# Patient Record
Sex: Female | Born: 1960 | Race: White | Hispanic: No | Marital: Married | Smoking: Former smoker
Health system: Southern US, Community
[De-identification: ages and names within clinical notes are randomized; demographics above are authoritative.]

## PROBLEM LIST (undated history)

## (undated) DIAGNOSIS — E119 Type 2 diabetes mellitus without complications: Secondary | ICD-10-CM

## (undated) DIAGNOSIS — F419 Anxiety disorder, unspecified: Secondary | ICD-10-CM

## (undated) DIAGNOSIS — I2510 Atherosclerotic heart disease of native coronary artery without angina pectoris: Secondary | ICD-10-CM

## (undated) DIAGNOSIS — I5032 Chronic diastolic (congestive) heart failure: Secondary | ICD-10-CM

## (undated) DIAGNOSIS — Z720 Tobacco use: Secondary | ICD-10-CM

## (undated) DIAGNOSIS — E785 Hyperlipidemia, unspecified: Secondary | ICD-10-CM

## (undated) DIAGNOSIS — I5189 Other ill-defined heart diseases: Secondary | ICD-10-CM

## (undated) DIAGNOSIS — F3341 Major depressive disorder, recurrent, in partial remission: Secondary | ICD-10-CM

## (undated) DIAGNOSIS — I10 Essential (primary) hypertension: Secondary | ICD-10-CM

## (undated) DIAGNOSIS — J45909 Unspecified asthma, uncomplicated: Secondary | ICD-10-CM

## (undated) HISTORY — DX: Essential (primary) hypertension: I10

## (undated) HISTORY — PX: CARDIAC CATHETERIZATION: SHX172

## (undated) HISTORY — DX: Other ill-defined heart diseases: I51.89

## (undated) HISTORY — DX: Type 2 diabetes mellitus without complications: E11.9

## (undated) HISTORY — DX: Major depressive disorder, recurrent, in partial remission: F33.41

## (undated) HISTORY — DX: Hyperlipidemia, unspecified: E78.5

## (undated) HISTORY — DX: Anxiety disorder, unspecified: F41.9

## (undated) HISTORY — DX: Atherosclerotic heart disease of native coronary artery without angina pectoris: I25.10

## (undated) HISTORY — DX: Chronic diastolic (congestive) heart failure: I50.32

## (undated) HISTORY — DX: Tobacco use: Z72.0

---

## 2007-06-23 ENCOUNTER — Emergency Department: Payer: Self-pay | Admitting: Emergency Medicine

## 2013-02-01 ENCOUNTER — Ambulatory Visit: Payer: Self-pay

## 2016-09-06 ENCOUNTER — Encounter: Payer: Self-pay | Admitting: Emergency Medicine

## 2016-09-06 ENCOUNTER — Emergency Department
Admission: EM | Admit: 2016-09-06 | Discharge: 2016-09-07 | Disposition: A | Discharge status: Home / Self Care | Payer: BLUE CROSS/BLUE SHIELD | Attending: Emergency Medicine | Admitting: Emergency Medicine

## 2016-09-06 DIAGNOSIS — Y999 Unspecified external cause status: Secondary | ICD-10-CM | POA: Insufficient documentation

## 2016-09-06 DIAGNOSIS — Y9389 Activity, other specified: Secondary | ICD-10-CM | POA: Diagnosis not present

## 2016-09-06 DIAGNOSIS — X58XXXA Exposure to other specified factors, initial encounter: Secondary | ICD-10-CM | POA: Insufficient documentation

## 2016-09-06 DIAGNOSIS — S4992XA Unspecified injury of left shoulder and upper arm, initial encounter: Secondary | ICD-10-CM | POA: Diagnosis present

## 2016-09-06 DIAGNOSIS — I10 Essential (primary) hypertension: Secondary | ICD-10-CM | POA: Diagnosis not present

## 2016-09-06 DIAGNOSIS — G20 Parkinson's disease: Secondary | ICD-10-CM | POA: Diagnosis not present

## 2016-09-06 DIAGNOSIS — M25512 Pain in left shoulder: Secondary | ICD-10-CM | POA: Diagnosis not present

## 2016-09-06 DIAGNOSIS — Y92007 Garden or yard of unspecified non-institutional (private) residence as the place of occurrence of the external cause: Secondary | ICD-10-CM | POA: Insufficient documentation

## 2016-09-06 MED ORDER — MELOXICAM 7.5 MG PO TABS: 7.5000 mg | ORAL_TABLET | Freq: Every day | ORAL | 1 refills | Status: AC

## 2016-09-06 MED ORDER — CYCLOBENZAPRINE HCL 10 MG PO TABS
5.0000 mg | ORAL_TABLET | Freq: Once | ORAL | Status: AC
  Administered 2016-09-06: 5 mg via ORAL
  Filled 2016-09-06: qty 1

## 2016-09-06 MED ORDER — CYCLOBENZAPRINE HCL 5 MG PO TABS: 5.0000 mg | ORAL_TABLET | Freq: Three times a day (TID) | ORAL | 0 refills | Status: AC | PRN

## 2016-09-06 NOTE — ED Provider Notes (Signed)
[facility] Emergency Department Provider Note  ____________________________________________  Time seen: Approximately 11:24 PM  I have reviewed the triage vital signs and the nursing notes.   HISTORY  Chief Complaint Shoulder Pain    HPI Sherry Torres is a 56 y.o. female presenting to the emergency department with 6/10 left shoulder pain. Patient states that non-radiating pain occurs at the lateral deltoid. Patient states that she noticed pain after working in the yard yesterday. She denies radiculopathy or weakness. No prior traumas or surgeries to the left upper extremity.  Patient states that she feels very anxious. Patient's husband was recently diagnosed with Parkinson's disease and patient is the only one in the household working. She denies chest pain, chest tightness, shortness of breath, nausea, vomiting and abdominal pain. She has not been diaphoretic. Patient states that she has essential hypertension and takes her medication in the morning. She denies a history of heavy lifting but has a very physically demanding job. Patient has tried Tylenol but has attempted no other alleviating measures.    No past medical history on file.  There are no active problems to display for this patient.   No past surgical history on file.  Prior to Admission medications   Medication Sig Start Date End Date Taking? Authorizing Provider  cyclobenzaprine (FLEXERIL) 5 MG tablet Take 1 tablet (5 mg total) by mouth 3 (three) times daily as needed for muscle spasms. 09/06/16 09/09/16  Orvil Feil, PA-C  meloxicam (MOBIC) 7.5 MG tablet Take 1 tablet (7.5 mg total) by mouth daily. 09/06/16 09/13/16  Orvil Feil, PA-C    Allergies Codeine  No family history on file.  Social History Social History  Substance Use Topics  . Smoking status: Not on file  . Smokeless tobacco: Not on file  . Alcohol use Not on file     Review of Systems  Constitutional: No  fever/chills Eyes: No visual changes. No discharge ENT: No upper respiratory complaints. Cardiovascular: no chest pain. Respiratory: no cough. No SOB. Gastrointestinal: No abdominal pain.  No nausea, no vomiting.  No diarrhea.  No constipation. Musculoskeletal: Patient has left shoulder pain.  Skin: Negative for rash, abrasions, lacerations, ecchymosis. Neurological: Negative for headaches, focal weakness or numbness. ___________________________________________   PHYSICAL EXAM:  VITAL SIGNS: ED Triage Vitals  Enc Vitals Group     BP 09/06/16 2210 (!) 206/83     Pulse Rate 09/06/16 2210 83     Resp 09/06/16 2210 20     Temp --      Temp src --      SpO2 09/06/16 2210 97 %     Weight 09/06/16 2208 200 lb (90.7 kg)     Height 09/06/16 2208  (1.575 m)     Head Circumference --      Peak Flow --      Pain Score 09/06/16 2208 6     Pain Loc --      Pain Edu? --      Excl. in GC? --      Constitutional: Alert and oriented. Well appearing and in no acute distress. Eyes: Conjunctivae are normal. PERRL. EOMI. Head: Atraumatic. Neck: No cervical spine tenderness to palpation. Patient has tenderness to palpation along the upper trapezius bilaterally. No radiculopathy was elicited with range of motion at the neck. Cardiovascular: Normal rate, regular rhythm. Normal S1 and S2.  Good peripheral circulation. Respiratory: Normal respiratory effort without tachypnea or retractions. Lungs CTAB. Good air entry to  the bases with no decreased or absent breath sounds. Gastrointestinal: Bowel sounds 4 quadrants. Soft and nontender to palpation. No guarding or rigidity. No palpable masses. No distention. No CVA tenderness. Musculoskeletal: Patient has 5 out of 5 strength in the upper extremities bilaterally. Left shoulder: Patient has mild tenderness to palpation along the left lateral deltoid. No weakness or pain was elicited with left rotator cuff testing. Palpable radial and ulnar pulses  bilaterally and symmetrically.  Neurologic:  Normal speech and language. No gross focal neurologic deficits are appreciated. Reflexes are 2+ and symmetric bilaterally.  Skin:  Skin is warm, dry and intact. No rash noted. Psychiatric: Mood and affect are normal. Speech and behavior are normal. Patient exhibits appropriate insight and judgement.   ____________________________________________   LABS (all labs ordered are listed, but only abnormal results are displayed)  Labs Reviewed - No data to display ____________________________________________  EKG  Normal sinus rhythm. No ST segment elevation. ____________________________________________  RADIOLOGY   No results found.  ____________________________________________    PROCEDURES  Procedure(s) performed:    Procedures    Medications  cyclobenzaprine (FLEXERIL) tablet 5 mg (not administered)     ____________________________________________   INITIAL IMPRESSION / ASSESSMENT AND PLAN / ED COURSE  Pertinent labs & imaging results that were available during my care of the patient were reviewed by me and considered in my medical decision making (see chart for details).  Review of the [place] CSRS was performed in accordance of the [facility] prior to dispensing any controlled drugs.    Assessment and Plan: Left Shoulder Pain  Patient presents to the emergency department with left shoulder pain along the distribution of the lateral deltoid. No pain or weakness was elicited with left rotator cuff testing. As patient is anxious with hypertension noted at triage, an EKG was conducted. EKG reveals normal sinus rhythm without ST segment elevation. Patient was discharged with Mobic and Flexeril. A referral was made to orthopedics, Dr. Joice Lofts. Patient was advised to follow up with her PCP regarding hypertension. Patient voiced understanding. All patient questions were answered.   ____________________________________________  FINAL  CLINICAL IMPRESSION(S) / ED DIAGNOSES  Final diagnoses:  Acute pain of left shoulder      NEW MEDICATIONS STARTED DURING THIS VISIT:  New Prescriptions   CYCLOBENZAPRINE (FLEXERIL) 5 MG TABLET    Take 1 tablet (5 mg total) by mouth 3 (three) times daily as needed for muscle spasms.   MELOXICAM (MOBIC) 7.5 MG TABLET    Take 1 tablet (7.5 mg total) by mouth daily.        This chart was dictated using voice recognition software/Dragon. Despite best efforts to proofread, errors can occur which can change the meaning. Any change was purely unintentional.    Orvil Feil, PA-C 09/06/16 2346    Sharman Cheek, MD 09/09/16 1030

## 2016-09-06 NOTE — ED Triage Notes (Signed)
Reports having left shoulder pain since yesterday after doing yard work.  Patient also reports pain at base of her neck.  Denies any other symptoms.

## 2016-09-07 NOTE — ED Notes (Signed)
Reviewed d/c instructions, folllow-up instructions and prescriptions with patient. Pt verbalized understanding

## 2017-05-21 ENCOUNTER — Ambulatory Visit: Payer: BLUE CROSS/BLUE SHIELD | Admitting: Nurse Practitioner

## 2017-05-21 ENCOUNTER — Encounter: Payer: Self-pay | Admitting: Nurse Practitioner

## 2017-05-21 VITALS — BP 140/80 | HR 73 | Resp 16 | Ht 62.0 in | Wt 210.6 lb

## 2017-05-21 DIAGNOSIS — M19012 Primary osteoarthritis, left shoulder: Secondary | ICD-10-CM | POA: Diagnosis not present

## 2017-05-21 DIAGNOSIS — M62830 Muscle spasm of back: Secondary | ICD-10-CM

## 2017-05-21 DIAGNOSIS — R5383 Other fatigue: Secondary | ICD-10-CM | POA: Diagnosis not present

## 2017-05-21 DIAGNOSIS — Z1231 Encounter for screening mammogram for malignant neoplasm of breast: Secondary | ICD-10-CM

## 2017-05-21 DIAGNOSIS — I10 Essential (primary) hypertension: Secondary | ICD-10-CM | POA: Diagnosis not present

## 2017-05-21 DIAGNOSIS — R7301 Impaired fasting glucose: Secondary | ICD-10-CM | POA: Diagnosis not present

## 2017-05-21 DIAGNOSIS — Z1239 Encounter for other screening for malignant neoplasm of breast: Secondary | ICD-10-CM

## 2017-05-21 MED ORDER — HYDROCHLOROTHIAZIDE 25 MG PO TABS: 25.0000 mg | ORAL_TABLET | Freq: Every day | ORAL | 1 refills | Status: DC

## 2017-05-21 MED ORDER — MELOXICAM 7.5 MG PO TABS: 7.5000 mg | ORAL_TABLET | Freq: Two times a day (BID) | ORAL | 1 refills | Status: DC | PRN

## 2017-05-21 MED ORDER — CYCLOBENZAPRINE HCL 5 MG PO TABS: 5.0000 mg | ORAL_TABLET | Freq: Three times a day (TID) | ORAL | 1 refills | Status: DC | PRN

## 2017-05-21 NOTE — Progress Notes (Signed)
Patient ID: Sherry Torres, female   DOB: 09/28/1960, 57 y.o.   MRN: [id number]  [facility] [address] [place], [place] [address]  Internal MEDICINE  Office Visit Note  Patient Name: Sherry Torres  [id number]  [id number]  Date of Service: 05/21/2017     Complaints/HPI Pt is here for establishment of PCP.  The patient is here to establish PCP. States that her prior PCP has been monitoring her blood sugars over the past several months. She was told that HgbA1c has been up and down. Running between 6.5 and 6.8. Not currently on any medications and not monitoring her blood sugars at home.  She also sates that she has palpable knots in both of her forearms. Associates this with tingling and itching in both hands. Sometimes the fingers hurt and burn. Will get so bad that it will wake her up at night. Has wrn a brace on her right wrist and continues to wear this brace on right wrist at night.     Current Medication: Outpatient Encounter Medications as of 05/21/2017  Medication Sig  . buPROPion (ZYBAN) 150 MG 12 hr tablet   . cyclobenzaprine (FLEXERIL) 5 MG tablet Take 1 tablet (5 mg total) by mouth 3 (three) times daily as needed for muscle spasms.  . hydrochlorothiazide (HYDRODIURIL) 25 MG tablet Take 1 tablet (25 mg total) by mouth daily.  . meloxicam (MOBIC) 7.5 MG tablet Take 1 tablet (7.5 mg total) by mouth 2 (two) times daily as needed for pain.  . VENTOLIN HFA 108 (90 Base) MCG/ACT inhaler   . [DISCONTINUED] cyclobenzaprine (FLEXERIL) 5 MG tablet Take 5 mg by mouth 3 (three) times daily as needed for muscle spasms.  . [DISCONTINUED] hydrochlorothiazide (HYDRODIURIL) 25 MG tablet   . [DISCONTINUED] meloxicam (MOBIC) 7.5 MG tablet    No facility-administered encounter medications on file as of 05/21/2017.     Surgical History: History reviewed. No pertinent surgical history.  Medical History: Past Medical History:  Diagnosis Date  . Anxiety   .  Hypertension     Family History: Family History  Problem Relation Age of Onset  . Cancer Mother   . Hyperlipidemia Father     Social History   Socioeconomic History  . Marital status: Divorced    Spouse name: Not on file  . Number of children: Not on file  . Years of education: Not on file  . Highest education level: Not on file  Social Needs  . Financial resource strain: Not on file  . Food insecurity - worry: Not on file  . Food insecurity - inability: Not on file  . Transportation needs - medical: Not on file  . Transportation needs - non-medical: Not on file  Occupational History  . Not on file  Tobacco Use  . Smoking status: Current Every Day Smoker    Types: Cigarettes  . Smokeless tobacco: Never Used  Substance and Sexual Activity  . Alcohol use: No    Frequency: Never  . Drug use: No  . Sexual activity: Not on file  Other Topics Concern  . Not on file  Social History Narrative  . Not on file     Review of Systems  Constitutional: Positive for fatigue. Negative for activity change and appetite change.  HENT: Positive for postnasal drip and rhinorrhea.   Eyes: Negative.   Respiratory: Positive for cough.   Cardiovascular: Negative for chest pain and palpitations.  Gastrointestinal: Negative for constipation, diarrhea and nausea.  Endocrine:  Fluctuating HgbA1c without diagnosis of diabetes .  Genitourinary: Negative for frequency.  Musculoskeletal: Positive for arthralgias.  Skin: Negative.   Allergic/Immunologic: Negative.   Neurological: Negative.   Hematological: Negative.   Psychiatric/Behavioral: Negative.     Vital Signs: Today's Vitals   05/21/17 1043  BP: 140/80  Pulse: 73  Resp: 16  SpO2: 97%  Weight: 210 lb 9.6 oz (95.5 kg)  Height: 5\' 2"  (1.575 m)    Physical Exam  Constitutional: She is oriented to person, place, and time. She appears well-developed and well-nourished.  HENT:  Head: Normocephalic and atraumatic.  Eyes:  Pupils are equal, round, and reactive to light.  Neck: Normal range of motion. Neck supple. No thyromegaly present.  Cardiovascular: Normal rate, regular rhythm and normal heart sounds.  Pulmonary/Chest: Effort normal. She has wheezes.  Scant expiratory wheezes heard, bilaterally.   Abdominal: Soft. Bowel sounds are normal. There is no tenderness.  Musculoskeletal:       Right wrist: She exhibits tenderness.       Left wrist: She exhibits tenderness.       Arms: Lymphadenopathy:    She has no cervical adenopathy.  Neurological: She is alert and oriented to person, place, and time.  Skin: Skin is warm and dry.  Psychiatric: She has a normal mood and affect.  Nursing note and vitals reviewed.    Assessment/Plan:    ICD-10-CM   1. Impaired fasting glucose R73.01 Microalbumin / creatinine urine ratio    HgB A1c  2. Hypertension, unspecified type I10 hydrochlorothiazide (HYDRODIURIL) 25 MG tablet    CBC with Differential/Platelet    Comprehensive metabolic panel    Lipid panel  3. Fatigue, unspecified type R53.83 TSH    T4, free    Vitamin D 1,25 dihydroxy  4. 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 Muscle spasm of back M62.830 cyclobenzaprine (FLEXERIL) 5 MG tablet  6. Screening for breast cancer Z12.31 MM Digital Screening   1. Will check labs, including HgbA1c and urine microalbumin. Discuss results and treatment plan at next visit.  2. bp stable. Continue HCTZ at current dose. Consider adding losartan at next visit. 3. Check thyroid panel 4. Renew meloxicam 7.5mg  twice daily for pain/inflammation.  5. Use flexeril 5mg  as needed and as prescribed. New rx sent to pharmacy. 6. Screening mammogram ordered.   Counseling:  General Counseling: I have discussed the findings of the evaluation and examination with Sherry Torres.  I have also discussed any further diagnostic evaluation that may be needed or ordered today. Sherry Torres verbalizes  understanding of the findings of todays visit. We also reviewed her medications today. she has been encouraged to call the office with any questions or concerns that should arise related to todays visit.  This patient was seen by Vincent Gros, FNP- C in Collaboration with Dr Lyndon Code as a part of collaborative care agreement  Patient should follow up in 2 weeks to discuss lab results. Will schedule full physical with pap smear in 4 months.

## 2017-06-10 LAB — COMPREHENSIVE METABOLIC PANEL
ALK PHOS: 68 IU/L (ref 39–117)
ALT: 18 IU/L (ref 0–32)
AST: 14 IU/L (ref 0–40)
Albumin/Globulin Ratio: 1.7 (ref 1.2–2.2)
Albumin: 4.3 g/dL (ref 3.5–5.5)
BUN/Creatinine Ratio: 13 (ref 9–23)
BUN: 10 mg/dL (ref 6–24)
Bilirubin Total: 0.4 mg/dL (ref 0.0–1.2)
CO2: 23 mmol/L (ref 20–29)
CREATININE: 0.78 mg/dL (ref 0.57–1.00)
Calcium: 9.2 mg/dL (ref 8.7–10.2)
Chloride: 100 mmol/L (ref 96–106)
GFR calc Af Amer: 98 mL/min/{1.73_m2} (ref >59)
GFR calc non Af Amer: 85 mL/min/{1.73_m2} (ref >59)
GLOBULIN, TOTAL: 2.5 g/dL (ref 1.5–4.5)
GLUCOSE: 157 mg/dL — AB (ref 65–99)
Potassium: 4.5 mmol/L (ref 3.5–5.2)
SODIUM: 141 mmol/L (ref 134–144)
Total Protein: 6.8 g/dL (ref 6.0–8.5)

## 2017-06-10 LAB — MICROALBUMIN / CREATININE URINE RATIO
CREATININE, UR: 47.2 mg/dL
Microalb/Creat Ratio: <6.4 mg/g creat (ref 0.0–30.0)
Microalbumin, Urine: <3 ug/mL

## 2017-06-10 LAB — LIPID PANEL
Chol/HDL Ratio: 4 ratio (ref 0.0–4.4)
Cholesterol, Total: 191 mg/dL (ref 100–199)
HDL: 48 mg/dL (ref >39)
LDL Calculated: 118 mg/dL — ABNORMAL HIGH (ref 0–99)
Triglycerides: 127 mg/dL (ref 0–149)
VLDL Cholesterol Cal: 25 mg/dL (ref 5–40)

## 2017-06-10 LAB — CBC WITH DIFFERENTIAL/PLATELET
BASOS ABS: 0 10*3/uL (ref 0.0–0.2)
Basos: 0 %
EOS (ABSOLUTE): 0.2 10*3/uL (ref 0.0–0.4)
Eos: 3 %
Hematocrit: 42.2 % (ref 34.0–46.6)
Hemoglobin: 14 g/dL (ref 11.1–15.9)
Immature Grans (Abs): 0 10*3/uL (ref 0.0–0.1)
Immature Granulocytes: 0 %
LYMPHS ABS: 2 10*3/uL (ref 0.7–3.1)
Lymphs: 26 %
MCH: 30.2 pg (ref 26.6–33.0)
MCHC: 33.2 g/dL (ref 31.5–35.7)
MCV: 91 fL (ref 79–97)
MONOS ABS: 0.5 10*3/uL (ref 0.1–0.9)
Monocytes: 6 %
NEUTROS PCT: 65 %
Neutrophils Absolute: 4.8 10*3/uL (ref 1.4–7.0)
PLATELETS: 252 10*3/uL (ref 150–379)
RBC: 4.64 x10E6/uL (ref 3.77–5.28)
RDW: 14.3 % (ref 12.3–15.4)
WBC: 7.5 10*3/uL (ref 3.4–10.8)

## 2017-06-10 LAB — VITAMIN D 1,25 DIHYDROXY
VITAMIN D 1, 25 (OH) TOTAL: 40 pg/mL
Vitamin D3 1, 25 (OH)2: 38 pg/mL

## 2017-06-10 LAB — T4, FREE: Free T4: 1.02 ng/dL (ref 0.82–1.77)

## 2017-06-10 LAB — HEMOGLOBIN A1C
ESTIMATED AVERAGE GLUCOSE: 154 mg/dL
Hgb A1c MFr Bld: 7 % — ABNORMAL HIGH (ref 4.8–5.6)

## 2017-06-10 LAB — TSH: TSH: 2.09 u[IU]/mL (ref 0.450–4.500)

## 2017-06-11 ENCOUNTER — Encounter: Payer: Self-pay | Admitting: Nurse Practitioner

## 2017-06-11 ENCOUNTER — Ambulatory Visit: Payer: BLUE CROSS/BLUE SHIELD | Admitting: Nurse Practitioner

## 2017-06-11 VITALS — BP 172/91 | HR 69 | Resp 16 | Ht 62.0 in | Wt 201.8 lb

## 2017-06-11 DIAGNOSIS — E119 Type 2 diabetes mellitus without complications: Secondary | ICD-10-CM | POA: Diagnosis not present

## 2017-06-11 DIAGNOSIS — I10 Essential (primary) hypertension: Secondary | ICD-10-CM | POA: Diagnosis not present

## 2017-06-11 MED ORDER — METFORMIN HCL 500 MG PO TABS: 250.0000 mg | ORAL_TABLET | Freq: Two times a day (BID) | ORAL | 3 refills | Status: DC

## 2017-06-11 MED ORDER — LOSARTAN POTASSIUM 25 MG PO TABS
25.0000 mg | ORAL_TABLET | Freq: Every day | ORAL | 3 refills | Status: DC
Start: 2017-06-11 — End: 2017-07-23

## 2017-06-11 NOTE — Progress Notes (Addendum)
[facility] [address] [place], [place] [address]  Internal MEDICINE  Office Visit Note  Patient Name: Sherry Torres  [id number]  [id number]  Date of Service: 06/11/2017  No chief complaint on file.   Diabetes  She presents for her initial diabetic visit. She has type 2 diabetes mellitus. No MedicAlert identification noted. The initial diagnosis of diabetes was made 3 months ago. There are no hypoglycemic associated symptoms. Pertinent negatives for hypoglycemia include no headaches or nervousness/anxiousness. Associated symptoms include fatigue, polydipsia and polyuria. Pertinent negatives for diabetes include no chest pain and no weakness. There are no hypoglycemic complications. (Hypertension) Risk factors for coronary artery disease include diabetes mellitus and hypertension. When asked about current treatments, none were reported. Compliance with diabetes treatment: this is initial visit for DM2. Her weight is stable. She is following a generally healthy diet. Meal planning includes avoidance of concentrated sweets. She has not had a previous visit with a dietitian. She participates in exercise intermittently. ACE inhibitor/angiotensin II receptor blocker: will start today. She does not see a podiatrist.Eye exam is not current.    Pt is here for routine follow up.    Current Medication: Outpatient Encounter Medications as of 06/11/2017  Medication Sig  . buPROPion (ZYBAN) 150 MG 12 hr tablet   . cyclobenzaprine (FLEXERIL) 5 MG tablet Take 1 tablet (5 mg total) by mouth 3 (three) times daily as needed for muscle spasms.  . hydrochlorothiazide (HYDRODIURIL) 25 MG tablet Take 1 tablet (25 mg total) by mouth daily.  . meloxicam (MOBIC) 7.5 MG tablet Take 1 tablet (7.5 mg total) by mouth 2 (two) times daily as needed for pain.  . VENTOLIN HFA 108 (90 Base) MCG/ACT inhaler Inhale 1-2 puffs into the lungs daily.   Marland Kitchen losartan (COZAAR) 25 MG tablet Take 1 tablet (25 mg  total) by mouth daily.  . metFORMIN (GLUCOPHAGE) 500 MG tablet Take 0.5 tablets (250 mg total) by mouth 2 (two) times daily with a meal.   No facility-administered encounter medications on file as of 06/11/2017.     Surgical History: No past surgical history on file.  Medical History: Past Medical History:  Diagnosis Date  . Anxiety   . Hypertension     Family History: Family History  Problem Relation Age of Onset  . Cancer Mother   . Hyperlipidemia Father     Social History   Socioeconomic History  . Marital status: Divorced    Spouse name: Not on file  . Number of children: Not on file  . Years of education: Not on file  . Highest education level: Not on file  Social Needs  . Financial resource strain: Not on file  . Food insecurity - worry: Not on file  . Food insecurity - inability: Not on file  . Transportation needs - medical: Not on file  . Transportation needs - non-medical: Not on file  Occupational History  . Not on file  Tobacco Use  . Smoking status: Current Every Day Smoker    Types: Cigarettes  . Smokeless tobacco: Never Used  Substance and Sexual Activity  . Alcohol use: No    Frequency: Never  . Drug use: No  . Sexual activity: Not on file  Other Topics Concern  . Not on file  Social History Narrative  . Not on file      Review of Systems  Constitutional: Positive for fatigue. Negative for activity change and appetite change.  HENT: Negative for postnasal drip and rhinorrhea.  Eyes: Negative.   Respiratory: Negative for cough.   Cardiovascular: Negative for chest pain and palpitations.       Elevated blood pressure  Gastrointestinal: Negative for constipation, diarrhea, nausea and vomiting.  Endocrine: Positive for polydipsia and polyuria.       Fluctuating HgbA1c without diagnosis of diabetes .  Genitourinary: Negative for frequency.  Musculoskeletal: Positive for arthralgias.  Skin: Negative.   Allergic/Immunologic: Negative.     Neurological: Negative for weakness, light-headedness and headaches.  Hematological: Negative for adenopathy. Does not bruise/bleed easily.  Psychiatric/Behavioral: Negative for agitation. The patient is not nervous/anxious.     Today's Vitals   06/11/17 1541  BP: (!) 172/91  Pulse: 69  Resp: 16  SpO2: 97%  Weight: 201 lb 12.8 oz (91.5 kg)  Height: 5\' 2"  (1.575 m)    Physical Exam  Constitutional: She is oriented to person, place, and time. She appears well-developed and well-nourished.  HENT:  Head: Normocephalic and atraumatic.  Eyes: Pupils are equal, round, and reactive to light.  Neck: Normal range of motion. Neck supple. No thyromegaly present.  Cardiovascular: Normal rate, regular rhythm and normal heart sounds.  Pulmonary/Chest: Effort normal and breath sounds normal. She has no wheezes.  Abdominal: Soft. Bowel sounds are normal. There is no tenderness.  Musculoskeletal: Normal range of motion.       Right wrist: She exhibits tenderness.       Left wrist: She exhibits tenderness.       Arms: Lymphadenopathy:    She has no cervical adenopathy.  Neurological: She is alert and oriented to person, place, and time.  Skin: Skin is warm and dry.  Psychiatric: She has a normal mood and affect. Her behavior is normal. Judgment and thought content normal.  Nursing note and vitals reviewed.   Assessment/Plan: 1. Controlled type 2 diabetes mellitus without complication, without long-term current use of insulin (HCC) Reviewed labs with patient. Blood sugar 152 with HgbA1c 7.0. - metFORMIN (GLUCOPHAGE) 500 MG tablet; Take 0.5 tablets (250 mg total) by mouth 2 (two) times daily with a meal.  Dispense: 60 tablet; Refill: 3 Blood glucose meter given to the patient and instructions provided for use. Meal planning guide given to patient.   2. Essential hypertension - losartan (COZAAR) 25 MG tablet; Take 1 tablet (25 mg total) by mouth daily.  Dispense: 30 tablet; Refill: 3 Continue  HCTZ as prescribed.  General Counseling: Sherry Torres verbalizes understanding of the findings of todays visit and agrees with plan of treatment. I have discussed any further diagnostic evaluation that may be needed or ordered today. We also reviewed her medications today. she has been encouraged to call the office with any questions or concerns that should arise related to todays visit.  Diabetes Counseling:  1. Addition of ACE inh/ ARB'S for nephroprotection. 2. Diabetic foot care, prevention of complications.  3.Exercise and lose weight.  4. Diabetic eye examination, 5. Monitor blood sugar closlely. nutrition counseling.  6.Sign and symptoms of hypoglycemia including shaking sweating,confusion and headaches.  This patient was seen by Vincent GrosHeather Chermaine Schnyder, FNP- C in Collaboration with Dr Lyndon CodeFozia M Khan as a part of collaborative care agreement     Meds ordered this encounter  Medications  . metFORMIN (GLUCOPHAGE) 500 MG tablet    Sig: Take 0.5 tablets (250 mg total) by mouth 2 (two) times daily with a meal.    Dispense:  60 tablet    Refill:  3    Order Specific Question:   Supervising Provider  Answer:   Lyndon Code [[id number]]  . losartan (COZAAR) 25 MG tablet    Sig: Take 1 tablet (25 mg total) by mouth daily.    Dispense:  30 tablet    Refill:  3    Order Specific Question:   Supervising Provider    Answer:   Lyndon Code [[id number]]    Time spent: 75 Minutes   Dr Lyndon Code Internal medicine

## 2017-06-23 DIAGNOSIS — I10 Essential (primary) hypertension: Secondary | ICD-10-CM | POA: Insufficient documentation

## 2017-06-23 DIAGNOSIS — I152 Hypertension secondary to endocrine disorders: Secondary | ICD-10-CM | POA: Insufficient documentation

## 2017-06-23 DIAGNOSIS — E119 Type 2 diabetes mellitus without complications: Secondary | ICD-10-CM | POA: Insufficient documentation

## 2017-07-23 ENCOUNTER — Ambulatory Visit: Payer: BLUE CROSS/BLUE SHIELD | Admitting: Nurse Practitioner

## 2017-07-23 ENCOUNTER — Other Ambulatory Visit: Payer: Self-pay

## 2017-07-23 ENCOUNTER — Encounter: Payer: Self-pay | Admitting: Nurse Practitioner

## 2017-07-23 VITALS — BP 150/80 | HR 71 | Resp 16 | Ht 62.0 in | Wt 200.0 lb

## 2017-07-23 DIAGNOSIS — I10 Essential (primary) hypertension: Secondary | ICD-10-CM

## 2017-07-23 DIAGNOSIS — E119 Type 2 diabetes mellitus without complications: Secondary | ICD-10-CM

## 2017-07-23 DIAGNOSIS — M19012 Primary osteoarthritis, left shoulder: Secondary | ICD-10-CM

## 2017-07-23 MED ORDER — OLMESARTAN MEDOXOMIL 5 MG PO TABS: 5.0000 mg | ORAL_TABLET | Freq: Every day | ORAL | 3 refills | Status: DC

## 2017-07-23 MED ORDER — HYDROCHLOROTHIAZIDE 25 MG PO TABS: 25.0000 mg | ORAL_TABLET | Freq: Every day | ORAL | 5 refills | Status: DC

## 2017-07-23 MED ORDER — MELOXICAM 7.5 MG PO TABS
7.5000 mg | ORAL_TABLET | Freq: Two times a day (BID) | ORAL | 3 refills | Status: DC | PRN
Start: 2017-07-23 — End: 2017-12-07

## 2017-07-23 NOTE — Progress Notes (Signed)
[facility] [address] [place], [place] [address]  Internal MEDICINE  Office Visit Note  Patient Name: Sherry Torres  [id number]  [id number]  Date of Service: 08/11/2017  Chief Complaint  Patient presents with  . Hypertension    The patient is here for routine follow up exam. Blood pressure is mildly elevated.as she has not been taking her losartan due to national recall. She states that her blood sugars have been doing well. She has no other concerns or complaints today.    Pt is here for routine follow up.    Current Medication: Outpatient Encounter Medications as of 07/23/2017  Medication Sig Note  . buPROPion (ZYBAN) 150 MG 12 hr tablet    . cyclobenzaprine (FLEXERIL) 5 MG tablet Take 1 tablet (5 mg total) by mouth 3 (three) times daily as needed for muscle spasms.   . metFORMIN (GLUCOPHAGE) 500 MG tablet Take 0.5 tablets (250 mg total) by mouth 2 (two) times daily with a meal.   . olmesartan (BENICAR) 5 MG tablet Take 1 tablet (5 mg total) by mouth daily.   . VENTOLIN HFA 108 (90 Base) MCG/ACT inhaler Inhale 1-2 puffs into the lungs daily.    . [DISCONTINUED] hydrochlorothiazide (HYDRODIURIL) 25 MG tablet Take 1 tablet (25 mg total) by mouth daily.   . [DISCONTINUED] losartan (COZAAR) 25 MG tablet Take 1 tablet (25 mg total) by mouth daily. 07/23/2017: losartan recall   . [DISCONTINUED] meloxicam (MOBIC) 7.5 MG tablet Take 1 tablet (7.5 mg total) by mouth 2 (two) times daily as needed for pain.    No facility-administered encounter medications on file as of 07/23/2017.     Surgical History: No past surgical history on file.  Medical History: Past Medical History:  Diagnosis Date  . Anxiety   . Hypertension     Family History: Family History  Problem Relation Age of Onset  . Cancer Mother   . Hyperlipidemia Father     Social History   Socioeconomic History  . Marital status: Divorced    Spouse name: Not on file  . Number of children: Not on  file  . Years of education: Not on file  . Highest education level: Not on file  Occupational History  . Not on file  Social Needs  . Financial resource strain: Not on file  . Food insecurity:    Worry: Not on file    Inability: Not on file  . Transportation needs:    Medical: Not on file    Non-medical: Not on file  Tobacco Use  . Smoking status: Current Every Day Smoker    Types: Cigarettes  . Smokeless tobacco: Never Used  Substance and Sexual Activity  . Alcohol use: No    Frequency: Never  . Drug use: No  . Sexual activity: Not on file  Lifestyle  . Physical activity:    Days per week: Not on file    Minutes per session: Not on file  . Stress: Not on file  Relationships  . Social connections:    Talks on phone: Not on file    Gets together: Not on file    Attends religious service: Not on file    Active member of club or organization: Not on file    Attends meetings of clubs or organizations: Not on file    Relationship status: Not on file  . Intimate partner violence:    Fear of current or ex partner: Not on file    Emotionally  abused: Not on file    Physically abused: Not on file    Forced sexual activity: Not on file  Other Topics Concern  . Not on file  Social History Narrative  . Not on file      Review of Systems  Constitutional: Positive for fatigue. Negative for activity change and appetite change.  HENT: Negative for congestion, postnasal drip, rhinorrhea, sinus pressure and voice change.   Eyes: Negative.   Respiratory: Negative for cough, shortness of breath and wheezing.   Cardiovascular: Negative for chest pain and palpitations.       Elevated blood pressure  Gastrointestinal: Negative for constipation, diarrhea, nausea and vomiting.  Endocrine: Positive for polydipsia and polyuria.       Fluctuating HgbA1c without diagnosis of diabetes .  Genitourinary: Negative.  Negative for frequency.  Musculoskeletal: Positive for arthralgias.  Skin:  Negative for rash.  Allergic/Immunologic: Negative for environmental allergies.  Neurological: Negative for weakness, light-headedness and headaches.  Hematological: Negative for adenopathy. Does not bruise/bleed easily.  Psychiatric/Behavioral: Negative for agitation. The patient is not nervous/anxious.    Today's Vitals   07/23/17 1006  BP: (!) 150/80  Pulse: 71  Resp: 16  SpO2: 98%  Weight: 200 lb (90.7 kg)  Height: 5\' 2"  (1.575 m)    Physical Exam  Constitutional: She is oriented to person, place, and time. She appears well-developed and well-nourished.  HENT:  Head: Normocephalic and atraumatic.  Eyes: Pupils are equal, round, and reactive to light.  Neck: Normal range of motion. Neck supple. No thyromegaly present.  Cardiovascular: Normal rate, regular rhythm and normal heart sounds.  Pulmonary/Chest: Effort normal and breath sounds normal. She has no wheezes.  Abdominal: Soft. Bowel sounds are normal. There is no tenderness.  Musculoskeletal: Normal range of motion.       Right wrist: She exhibits tenderness.       Left wrist: She exhibits tenderness.       Arms: Lymphadenopathy:    She has no cervical adenopathy.  Neurological: She is alert and oriented to person, place, and time.  Skin: Skin is warm and dry.  Psychiatric: She has a normal mood and affect. Her behavior is normal. Judgment and thought content normal.  Nursing note and vitals reviewed.   Assessment/Plan: 1. Essential hypertension D/c cozaar due to national recall. Start olmesartan 5mg  tablets daily. reassess bp at her next visit.  - olmesartan (BENICAR) 5 MG tablet; Take 1 tablet (5 mg total) by mouth daily.  Dispense: 30 tablet; Refill: 3  2. Controlled type 2 diabetes mellitus without complication, without long-term current use of insulin (HCC) HgbA1c checked with labs was 7.0 today. Continue low dose metformin twice daily. Monitor blood sugars closely.   General Counseling: Aggie Cosierheresa verbalizes  understanding of the findings of todays visit and agrees with plan of treatment. I have discussed any further diagnostic evaluation that may be needed or ordered today. We also reviewed her medications today. she has been encouraged to call the office with any questions or concerns that should arise related to todays visit.  Hypertension Counseling:   The following hypertensive lifestyle modification were recommended and discussed:  1. Limiting alcohol intake to less than 1 oz/day of ethanol:(24 oz of beer or 8 oz of wine or 2 oz of 100-proof whiskey). 2. Take baby ASA 81 mg daily. 3. Importance of regular aerobic exercise and losing weight. 4. Reduce dietary saturated fat and cholesterol intake for overall cardiovascular health. 5. Maintaining adequate dietary potassium, calcium, and  magnesium intake. 6. Regular monitoring of the blood pressure. 7. Reduce sodium intake to less than 100 mmol/day (less than 2.3 gm of sodium or less than 6 gm of sodium choride)   This patient was seen by Vincent Gros, FNP- C in Collaboration with Dr Lyndon Code as a part of collaborative care agreement   Meds ordered this encounter  Medications  . olmesartan (BENICAR) 5 MG tablet    Sig: Take 1 tablet (5 mg total) by mouth daily.    Dispense:  30 tablet    Refill:  3    D/c losartan due to national recall    Order Specific Question:   Supervising Provider    Answer:   Lyndon Code [[id number]]    Time spent: 50 Minutes      Dr Lyndon Code Internal medicine

## 2017-09-17 ENCOUNTER — Ambulatory Visit: Payer: BLUE CROSS/BLUE SHIELD | Admitting: Nurse Practitioner

## 2017-09-17 ENCOUNTER — Encounter: Payer: Self-pay | Admitting: Nurse Practitioner

## 2017-09-17 VITALS — BP 142/70 | HR 55 | Resp 16 | Ht 62.0 in | Wt 195.6 lb

## 2017-09-17 DIAGNOSIS — Z1231 Encounter for screening mammogram for malignant neoplasm of breast: Secondary | ICD-10-CM | POA: Diagnosis not present

## 2017-09-17 DIAGNOSIS — F3341 Major depressive disorder, recurrent, in partial remission: Secondary | ICD-10-CM | POA: Diagnosis not present

## 2017-09-17 DIAGNOSIS — M62830 Muscle spasm of back: Secondary | ICD-10-CM | POA: Insufficient documentation

## 2017-09-17 DIAGNOSIS — E11649 Type 2 diabetes mellitus with hypoglycemia without coma: Secondary | ICD-10-CM | POA: Diagnosis not present

## 2017-09-17 DIAGNOSIS — Z1239 Encounter for other screening for malignant neoplasm of breast: Secondary | ICD-10-CM | POA: Insufficient documentation

## 2017-09-17 DIAGNOSIS — E119 Type 2 diabetes mellitus without complications: Secondary | ICD-10-CM

## 2017-09-17 DIAGNOSIS — Z0001 Encounter for general adult medical examination with abnormal findings: Secondary | ICD-10-CM

## 2017-09-17 HISTORY — DX: Major depressive disorder, recurrent, in partial remission: F33.41

## 2017-09-17 LAB — POCT GLYCOSYLATED HEMOGLOBIN (HGB A1C): Hemoglobin A1C: 6.5

## 2017-09-17 MED ORDER — BUPROPION HCL ER (SMOKING DET) 150 MG PO TB12: 150.0000 mg | ORAL_TABLET | Freq: Two times a day (BID) | ORAL | 5 refills | Status: DC

## 2017-09-17 MED ORDER — CYCLOBENZAPRINE HCL 5 MG PO TABS: 5.0000 mg | ORAL_TABLET | Freq: Three times a day (TID) | ORAL | 3 refills | Status: DC | PRN

## 2017-09-17 NOTE — Progress Notes (Signed)
[facility] [address] [place], [place] [address]  Internal MEDICINE  Office Visit Note  Patient Name: Sherry Torres  [id number]  [id number]  Date of Service: 09/17/2017   Pt is here for routine follow up.    Chief Complaint  Patient presents with  . Diabetes    6wk folow up on A1C  . Depression    Diabetes  She presents for her follow-up diabetic visit. She has type 2 diabetes mellitus. No MedicAlert identification noted. Her disease course has been stable. Hypoglycemia symptoms include nervousness/anxiousness and sleepiness. Pertinent negatives for hypoglycemia include no headaches. Associated symptoms include fatigue, polydipsia and polyuria. Pertinent negatives for diabetes include no chest pain and no weakness. There are no hypoglycemic complications. Symptoms are stable. Risk factors for coronary artery disease include diabetes mellitus, hypertension and obesity. Current diabetic treatment includes oral agent (monotherapy). She is compliant with treatment all of the time. Her weight is decreasing steadily. She is following a generally healthy diet. Meal planning includes avoidance of concentrated sweets. She has not had a previous visit with a dietitian. She rarely participates in exercise. There is no change in her home blood glucose trend. An ACE inhibitor/angiotensin II receptor blocker is being taken. She does not see a podiatrist.Eye exam is not current (will make referral for eye exam today.).  Depression         This is a chronic problem.  The current episode started more than 1 year ago.   The onset quality is undetermined.   The problem occurs daily.The problem is unchanged.  Associated symptoms include fatigue.  Associated symptoms include no appetite change and no headaches.     The symptoms are aggravated by family issues and work stress.  Past treatments include other medications.  Compliance with treatment is good.  Previous treatment provided moderate  relief.     Current Medication: Outpatient Encounter Medications as of 09/17/2017  Medication Sig  . buPROPion (ZYBAN) 150 MG 12 hr tablet Take 1 tablet (150 mg total) by mouth 2 (two) times daily.  . cyclobenzaprine (FLEXERIL) 5 MG tablet Take 1 tablet (5 mg total) by mouth 3 (three) times daily as needed for muscle spasms.  . hydrochlorothiazide (HYDRODIURIL) 25 MG tablet Take 1 tablet (25 mg total) by mouth daily.  . meloxicam (MOBIC) 7.5 MG tablet Take 1 tablet (7.5 mg total) by mouth 2 (two) times daily as needed for pain.  . metFORMIN (GLUCOPHAGE) 500 MG tablet Take 0.5 tablets (250 mg total) by mouth 2 (two) times daily with a meal.  . olmesartan (BENICAR) 5 MG tablet Take 1 tablet (5 mg total) by mouth daily.  . VENTOLIN HFA 108 (90 Base) MCG/ACT inhaler Inhale 1-2 puffs into the lungs daily.   . [DISCONTINUED] buPROPion (ZYBAN) 150 MG 12 hr tablet   . [DISCONTINUED] cyclobenzaprine (FLEXERIL) 5 MG tablet Take 1 tablet (5 mg total) by mouth 3 (three) times daily as needed for muscle spasms.   No facility-administered encounter medications on file as of 09/17/2017.     Surgical History: History reviewed. No pertinent surgical history.  Medical History: Past Medical History:  Diagnosis Date  . Anxiety   . Hypertension   . Recurrent major depressive disorder, in partial remission (HCC) 09/17/2017    Family History: Family History  Problem Relation Age of Onset  . Cancer Mother   . Hyperlipidemia Father     Social History   Socioeconomic History  . Marital status: Divorced    Spouse  name: Not on file  . Number of children: Not on file  . Years of education: Not on file  . Highest education level: Not on file  Occupational History  . Not on file  Social Needs  . Financial resource strain: Not on file  . Food insecurity:    Worry: Not on file    Inability: Not on file  . Transportation needs:    Medical: Not on file    Non-medical: Not on file  Tobacco Use  .  Smoking status: Current Every Day Smoker    Types: Cigarettes  . Smokeless tobacco: Never Used  Substance and Sexual Activity  . Alcohol use: No    Frequency: Never  . Drug use: No  . Sexual activity: Not on file  Lifestyle  . Physical activity:    Days per week: Not on file    Minutes per session: Not on file  . Stress: Not on file  Relationships  . Social connections:    Talks on phone: Not on file    Gets together: Not on file    Attends religious service: Not on file    Active member of club or organization: Not on file    Attends meetings of clubs or organizations: Not on file    Relationship status: Not on file  . Intimate partner violence:    Fear of current or ex partner: Not on file    Emotionally abused: Not on file    Physically abused: Not on file    Forced sexual activity: Not on file  Other Topics Concern  . Not on file  Social History Narrative  . Not on file      Review of Systems  Constitutional: Positive for fatigue. Negative for activity change and appetite change.  HENT: Negative for congestion, postnasal drip, rhinorrhea, sinus pressure and voice change.   Eyes: Negative.   Respiratory: Negative for cough, shortness of breath and wheezing.   Cardiovascular: Negative for chest pain and palpitations.       Elevated blood pressure  Gastrointestinal: Negative for constipation, diarrhea, nausea and vomiting.  Endocrine: Positive for polydipsia and polyuria.       Improving blood sugars.   Genitourinary: Negative for frequency.       Mentions that she did have a menstrual cycle a week or so ago. First time in several years. Did recently start metformin.   Musculoskeletal: Positive for arthralgias.  Skin: Negative for rash.  Allergic/Immunologic: Negative for environmental allergies.  Neurological: Negative for weakness, light-headedness and headaches.  Hematological: Negative for adenopathy. Does not bruise/bleed easily.  Psychiatric/Behavioral:  Positive for depression. Negative for agitation. The patient is nervous/anxious.     Today's Vitals   09/17/17 0931  BP: (!) 142/70  Pulse: (!) 55  Resp: 16  SpO2: 92%  Weight: 195 lb 9.6 oz (88.7 kg)  Height:  (1.575 m)    Physical Exam  Constitutional: She is oriented to person, place, and time. She appears well-developed and well-nourished. No distress.  HENT:  Head: Normocephalic and atraumatic.  Mouth/Throat: Oropharynx is clear and moist. No oropharyngeal exudate.  Eyes: Pupils are equal, round, and reactive to light. EOM are normal.  Neck: Normal range of motion. Neck supple. No JVD present. No tracheal deviation present. No thyromegaly present.  Cardiovascular: Normal rate, regular rhythm and normal heart sounds. Exam reveals no gallop and no friction rub.  No murmur heard. Pulmonary/Chest: Effort normal and breath sounds normal. No respiratory distress. She  has no wheezes. She has no rales. She exhibits no tenderness.  Abdominal: Soft. Bowel sounds are normal. There is no tenderness.  Musculoskeletal: Normal range of motion.  Lymphadenopathy:    She has no cervical adenopathy.  Neurological: She is alert and oriented to person, place, and time. No cranial nerve deficit.  Skin: Skin is warm and dry. She is not diaphoretic.  Psychiatric: She has a normal mood and affect. Her behavior is normal. Judgment and thought content normal.  Nursing note and vitals reviewed.   Assessment/Plan: 1. Type 2 diabetes mellitus without complication, without long-term current use of insulin (HCC) - POCT HgB A1C 6.5 today. Continue metformin at current dose. Referral made for diabetic eye exam.  - Ambulatory referral to Ophthalmology  2. Muscle spasm of back Improving. Muscle relaxer and NSAIDs may be used as needed and as previously prescribed  - cyclobenzaprine (FLEXERIL) 5 MG tablet; Take 1 tablet (5 mg total) by mouth 3 (three) times daily as needed for muscle spasms.  Dispense:  30 tablet; Refill: 3  3. Recurrent major depressive disorder, in partial remission (HCC) - buPROPion (ZYBAN) 150 MG 12 hr tablet; Take 1 tablet (150 mg total) by mouth 2 (two) times daily.  Dispense: 60 tablet; Refill: 5  4. Screening for breast cancer - MM DIGITAL SCREENING BILATERAL; Future  General Counseling: Britny verbalizes understanding of the findings of todays visit and agrees with plan of treatment. I have discussed any further diagnostic evaluation that may be needed or ordered today. We also reviewed her medications today. she has been encouraged to call the office with any questions or concerns that should arise related to todays visit.  Diabetes Counseling:  1. Addition of ACE inh/ ARB'S for nephroprotection. 2. Diabetic foot care, prevention of complications.  3.Exercise and lose weight.  4. Diabetic eye examination, 5. Monitor blood sugar closlely. nutrition counseling.  6.Sign and symptoms of hypoglycemia including shaking sweating,confusion and headaches.  This patient was seen by Vincent Gros, FNP- C in Collaboration with Dr Lyndon Code as a part of collaborative care agreement    Orders Placed This Encounter  Procedures  . MM DIGITAL SCREENING BILATERAL  . Ambulatory referral to Ophthalmology  . POCT HgB A1C    Meds ordered this encounter  Medications  . buPROPion (ZYBAN) 150 MG 12 hr tablet    Sig: Take 1 tablet (150 mg total) by mouth 2 (two) times daily.    Dispense:  60 tablet    Refill:  5    Order Specific Question:   Supervising Provider    Answer:   Lyndon Code [[id number]]  . cyclobenzaprine (FLEXERIL) 5 MG tablet    Sig: Take 1 tablet (5 mg total) by mouth 3 (three) times daily as needed for muscle spasms.    Dispense:  30 tablet    Refill:  3    Order Specific Question:   Supervising Provider    Answer:   Lyndon Code [[id number]]    Time spent: 69 Minutes     Dr Lyndon Code Internal medicine

## 2017-10-08 ENCOUNTER — Ambulatory Visit
Admission: RE | Admit: 2017-10-08 | Discharge: 2017-10-08 | Disposition: A | Discharge status: Home / Self Care | Payer: BLUE CROSS/BLUE SHIELD | Source: Ambulatory Visit | Attending: Nurse Practitioner | Admitting: Nurse Practitioner

## 2017-10-08 DIAGNOSIS — Z1231 Encounter for screening mammogram for malignant neoplasm of breast: Secondary | ICD-10-CM | POA: Insufficient documentation

## 2017-10-08 DIAGNOSIS — Z1239 Encounter for other screening for malignant neoplasm of breast: Secondary | ICD-10-CM

## 2017-11-09 ENCOUNTER — Other Ambulatory Visit: Payer: Self-pay | Admitting: Nurse Practitioner

## 2017-11-09 MED ORDER — VENTOLIN HFA 108 (90 BASE) MCG/ACT IN AERS: 1.0000 | INHALATION_SPRAY | Freq: Every day | RESPIRATORY_TRACT | 2 refills | Status: DC

## 2017-12-07 ENCOUNTER — Other Ambulatory Visit: Payer: Self-pay

## 2017-12-07 DIAGNOSIS — I10 Essential (primary) hypertension: Secondary | ICD-10-CM

## 2017-12-07 DIAGNOSIS — M19012 Primary osteoarthritis, left shoulder: Secondary | ICD-10-CM

## 2017-12-07 MED ORDER — OLMESARTAN MEDOXOMIL 5 MG PO TABS: 5.0000 mg | ORAL_TABLET | Freq: Every day | ORAL | 3 refills | Status: DC

## 2017-12-07 MED ORDER — MELOXICAM 7.5 MG PO TABS: 7.5000 mg | ORAL_TABLET | Freq: Two times a day (BID) | ORAL | 3 refills | Status: DC | PRN

## 2017-12-08 ENCOUNTER — Other Ambulatory Visit: Payer: Self-pay

## 2017-12-08 DIAGNOSIS — E119 Type 2 diabetes mellitus without complications: Secondary | ICD-10-CM

## 2017-12-08 MED ORDER — METFORMIN HCL 500 MG PO TABS
250.0000 mg | ORAL_TABLET | Freq: Two times a day (BID) | ORAL | 3 refills | Status: DC
Start: 2017-12-08 — End: 2018-05-15

## 2017-12-16 ENCOUNTER — Ambulatory Visit: Payer: Self-pay | Admitting: Adult Health

## 2017-12-16 ENCOUNTER — Telehealth: Payer: Self-pay

## 2017-12-16 NOTE — Telephone Encounter (Signed)
Pt called having sinus nfection we advised need seen she said she go to walk in clinic or call  [place] if able to come in

## 2018-01-03 ENCOUNTER — Encounter: Payer: Self-pay | Admitting: Nurse Practitioner

## 2018-01-03 ENCOUNTER — Ambulatory Visit: Payer: BLUE CROSS/BLUE SHIELD | Admitting: Nurse Practitioner

## 2018-01-03 VITALS — BP 144/72 | HR 84 | Resp 16 | Ht 62.0 in | Wt 190.6 lb

## 2018-01-03 DIAGNOSIS — R079 Chest pain, unspecified: Secondary | ICD-10-CM

## 2018-01-03 DIAGNOSIS — M25512 Pain in left shoulder: Secondary | ICD-10-CM | POA: Diagnosis not present

## 2018-01-03 DIAGNOSIS — E119 Type 2 diabetes mellitus without complications: Secondary | ICD-10-CM | POA: Diagnosis not present

## 2018-01-03 DIAGNOSIS — I10 Essential (primary) hypertension: Secondary | ICD-10-CM

## 2018-01-03 MED ORDER — DICLOFENAC SODIUM 1 % TD GEL: 4.0000 g | Freq: Four times a day (QID) | TRANSDERMAL | 1 refills | Status: DC

## 2018-01-03 NOTE — Progress Notes (Signed)
[facility] [address] [place], [place] [address]  Internal MEDICINE  Office Visit Note  Patient Name: Sherry Torres  [id number]  [id number]  Date of Service: 01/11/2018  Chief Complaint  Patient presents with  . Shoulder Pain    left shoulder pain.   . Back Pain    The patient is c/o left shoulder pain which is intermittent. When it happens, it catches her by surprise. It will radiate into the left upper back and neck. Sometimes, it will radiate into the left upper chest. This is happening three to five times per day. She has to sit down and rest for 10 to 30 minutes. Gradually, this will subside. After the episode, she is pain free. She has full ROM and strength in her left arm. She states that she does do a lot of lifting at her job.       Current Medication: Outpatient Encounter Medications as of 01/03/2018  Medication Sig Note  . buPROPion (ZYBAN) 150 MG 12 hr tablet Take 1 tablet (150 mg total) by mouth 2 (two) times daily.   . cyclobenzaprine (FLEXERIL) 5 MG tablet Take 1 tablet (5 mg total) by mouth 3 (three) times daily as needed for muscle spasms.   . metFORMIN (GLUCOPHAGE) 500 MG tablet Take 0.5 tablets (250 mg total) by mouth 2 (two) times daily with a meal.   . olmesartan (BENICAR) 5 MG tablet Take 1 tablet (5 mg total) by mouth daily.   . VENTOLIN HFA 108 (90 Base) MCG/ACT inhaler Inhale 1-2 puffs into the lungs daily.   . [DISCONTINUED] hydrochlorothiazide (HYDRODIURIL) 25 MG tablet Take 1 tablet (25 mg total) by mouth daily.   . [DISCONTINUED] meloxicam (MOBIC) 7.5 MG tablet Take 1 tablet (7.5 mg total) by mouth 2 (two) times daily as needed for pain.   Marland Kitchen. diclofenac sodium (VOLTAREN) 1 % GEL Apply 4 g topically 4 (four) times daily. 01/05/2018: Insurance has not approved, did not pick up from pharmacy   No facility-administered encounter medications on file as of 01/03/2018.     Surgical History: Past Surgical History:  Procedure Laterality  Date  . LEFT HEART CATH AND CORONARY ANGIOGRAPHY N/A 01/06/2018   Procedure: LEFT HEART CATH AND CORONARY ANGIOGRAPHY;  Surgeon: Iran OuchArida, Muhammad A, MD;  Location: [facility] INVASIVE CV LAB;  Service: Cardiovascular;  Laterality: N/A;    Medical History: Past Medical History:  Diagnosis Date  . Anxiety   . Asthma   . Hypertension   . Recurrent major depressive disorder, in partial remission (HCC) 09/17/2017    Family History: Family History  Problem Relation Age of Onset  . Cancer Mother   . Breast cancer Mother [id number]  . Hyperlipidemia Father     Social History   Socioeconomic History  . Marital status: Married    Spouse name: Not on file  . Number of children: Not on file  . Years of education: Not on file  . Highest education level: Not on file  Occupational History  . Not on file  Social Needs  . Financial resource strain: Not on file  . Food insecurity:    Worry: Not on file    Inability: Not on file  . Transportation needs:    Medical: Not on file    Non-medical: Not on file  Tobacco Use  . Smoking status: Current Every Day Smoker    Types: Cigarettes  . Smokeless tobacco: Never Used  Substance and Sexual Activity  . Alcohol use: No  Frequency: Never  . Drug use: No  . Sexual activity: Not on file  Lifestyle  . Physical activity:    Days per week: Not on file    Minutes per session: Not on file  . Stress: Not on file  Relationships  . Social connections:    Talks on phone: Not on file    Gets together: Not on file    Attends religious service: Not on file    Active member of club or organization: Not on file    Attends meetings of clubs or organizations: Not on file    Relationship status: Not on file  . Intimate partner violence:    Fear of current or ex partner: Not on file    Emotionally abused: Not on file    Physically abused: Not on file    Forced sexual activity: Not on file  Other Topics Concern  . Not on file  Social History Narrative  . Not  on file      Review of Systems  Constitutional: Positive for fatigue. Negative for activity change and appetite change.  HENT: Negative for congestion, postnasal drip, rhinorrhea, sinus pressure and voice change.   Eyes: Negative.   Respiratory: Negative for cough, chest tightness, shortness of breath and wheezing.   Cardiovascular: Positive for chest pain. Negative for palpitations.       Elevated blood pressure  Gastrointestinal: Negative for constipation, diarrhea, nausea and vomiting.  Endocrine: Negative for polydipsia and polyuria.       Improving blood sugars.   Genitourinary: Negative.  Negative for frequency.  Musculoskeletal: Positive for arthralgias and neck pain.       Left shoulder pain which worsens with exertion. Will sometimes radiate to her neck and sometimes will radiate to her left chest.   Skin: Negative.  Negative for rash.  Allergic/Immunologic: Negative for environmental allergies.  Neurological: Negative for dizziness, weakness, light-headedness and headaches.  Hematological: Negative for adenopathy. Does not bruise/bleed easily.  Psychiatric/Behavioral: Negative for agitation. The patient is nervous/anxious.     Today's Vitals   01/03/18 1201  BP: (!) 144/72  Pulse: 84  Resp: 16  SpO2: 95%  Weight: 190 lb 9.6 oz (86.5 kg)  Height: 5\' 2"  (1.575 m)    Physical Exam  Constitutional: She is oriented to person, place, and time. She appears well-developed and well-nourished. No distress.  HENT:  Head: Normocephalic and atraumatic.  Nose: Nose normal.  Mouth/Throat: Oropharynx is clear and moist. No oropharyngeal exudate.  Eyes: Pupils are equal, round, and reactive to light. Conjunctivae and EOM are normal.  Neck: Normal range of motion. Neck supple. No JVD present. Carotid bruit is not present. No tracheal deviation present. No thyromegaly present.  Cardiovascular: Normal rate, regular rhythm, normal heart sounds and intact distal pulses. Exam reveals no  gallop and no friction rub.  No murmur heard. ECG performed today. It was borderline, showing possible left atrial enlargement.   Pulmonary/Chest: Effort normal and breath sounds normal. No respiratory distress. She has no wheezes. She has no rales. She exhibits no tenderness.  Abdominal: Soft. Bowel sounds are normal. There is no tenderness.  Musculoskeletal: Normal range of motion.  She has moderate left shoulder pain, worse with medium palpation. And mostly along the clavicular and AC joint areas. No deformity or abnormality palpated. ROM and strength of the left shoulder are intact.   Lymphadenopathy:    She has no cervical adenopathy.  Neurological: She is alert and oriented to person, place, and time.  No cranial nerve deficit.  Skin: Skin is warm and dry. She is not diaphoretic.  Psychiatric: She has a normal mood and affect. Her behavior is normal. Judgment and thought content normal.  Nursing note and vitals reviewed.  Assessment/Plan: 1. Chest pain, unspecified type Shoulder pain which radiates into left chest at times. ECG is borderline with evidence of left atrial enlargement. Will get echocardiogram for further evaluation. Recommend she be seen in ER if chest pain gets worse, causes shortness of breath, or other worrisome symptoms.  - EKG 12-Lead - ECHOCARDIOGRAM COMPLETE; Future  2. Acute pain of left shoulder Add voltaren gel which may be applied up to 4 times daily as needed for joint pain.  - diclofenac sodium (VOLTAREN) 1 % GEL; Apply 4 g topically 4 (four) times daily.  Dispense: 100 g; Refill: 1  3. Essential hypertension Stable. No changes to bp medication today.   4. Controlled type 2 diabetes mellitus without complication, without long-term current use of insulin (HCC) Continue diabetic medication as prescribed .  General Counseling: Natalyn verbalizes understanding of the findings of todays visit and agrees with plan of treatment. I have discussed any further  diagnostic evaluation that may be needed or ordered today. We also reviewed her medications today. she has been encouraged to call the office with any questions or concerns that should arise related to todays visit.  This patient was seen by Vincent Gros FNP Collaboration with Dr Lyndon Code as a part of collaborative care agreement  Orders Placed This Encounter  Procedures  . EKG 12-Lead  . ECHOCARDIOGRAM COMPLETE    Meds ordered this encounter  Medications  . diclofenac sodium (VOLTAREN) 1 % GEL    Sig: Apply 4 g topically 4 (four) times daily.    Dispense:  100 g    Refill:  1    Order Specific Question:   Supervising Provider    Answer:   Lyndon Code [[id number]]    Time spent: 47 Minutes      Dr Lyndon Code Internal medicine

## 2018-01-04 ENCOUNTER — Encounter: Payer: Self-pay | Admitting: Emergency Medicine

## 2018-01-04 ENCOUNTER — Emergency Department
Admission: EM | Admit: 2018-01-04 | Discharge: 2018-01-04 | Disposition: A | Discharge status: Home / Self Care | Payer: BLUE CROSS/BLUE SHIELD | Attending: Emergency Medicine | Admitting: Emergency Medicine

## 2018-01-04 ENCOUNTER — Other Ambulatory Visit: Payer: Self-pay

## 2018-01-04 ENCOUNTER — Telehealth: Payer: Self-pay | Admitting: Nurse Practitioner

## 2018-01-04 ENCOUNTER — Emergency Department: Payer: BLUE CROSS/BLUE SHIELD

## 2018-01-04 DIAGNOSIS — M25512 Pain in left shoulder: Secondary | ICD-10-CM | POA: Diagnosis not present

## 2018-01-04 DIAGNOSIS — E1165 Type 2 diabetes mellitus with hyperglycemia: Secondary | ICD-10-CM | POA: Diagnosis present

## 2018-01-04 DIAGNOSIS — F329 Major depressive disorder, single episode, unspecified: Secondary | ICD-10-CM | POA: Diagnosis present

## 2018-01-04 DIAGNOSIS — I214 Non-ST elevation (NSTEMI) myocardial infarction: Secondary | ICD-10-CM | POA: Diagnosis not present

## 2018-01-04 DIAGNOSIS — J45909 Unspecified asthma, uncomplicated: Secondary | ICD-10-CM | POA: Diagnosis present

## 2018-01-04 DIAGNOSIS — M549 Dorsalgia, unspecified: Secondary | ICD-10-CM | POA: Diagnosis not present

## 2018-01-04 DIAGNOSIS — E669 Obesity, unspecified: Secondary | ICD-10-CM | POA: Diagnosis present

## 2018-01-04 DIAGNOSIS — Z87891 Personal history of nicotine dependence: Secondary | ICD-10-CM | POA: Diagnosis not present

## 2018-01-04 DIAGNOSIS — Z79899 Other long term (current) drug therapy: Secondary | ICD-10-CM

## 2018-01-04 DIAGNOSIS — E119 Type 2 diabetes mellitus without complications: Secondary | ICD-10-CM | POA: Insufficient documentation

## 2018-01-04 DIAGNOSIS — R079 Chest pain, unspecified: Secondary | ICD-10-CM | POA: Insufficient documentation

## 2018-01-04 DIAGNOSIS — F17210 Nicotine dependence, cigarettes, uncomplicated: Secondary | ICD-10-CM | POA: Diagnosis present

## 2018-01-04 DIAGNOSIS — E785 Hyperlipidemia, unspecified: Secondary | ICD-10-CM | POA: Diagnosis present

## 2018-01-04 DIAGNOSIS — Z885 Allergy status to narcotic agent status: Secondary | ICD-10-CM

## 2018-01-04 DIAGNOSIS — Z7984 Long term (current) use of oral hypoglycemic drugs: Secondary | ICD-10-CM | POA: Insufficient documentation

## 2018-01-04 DIAGNOSIS — I25110 Atherosclerotic heart disease of native coronary artery with unstable angina pectoris: Secondary | ICD-10-CM | POA: Diagnosis present

## 2018-01-04 DIAGNOSIS — Z791 Long term (current) use of non-steroidal anti-inflammatories (NSAID): Secondary | ICD-10-CM

## 2018-01-04 DIAGNOSIS — I10 Essential (primary) hypertension: Secondary | ICD-10-CM | POA: Diagnosis not present

## 2018-01-04 DIAGNOSIS — Z6835 Body mass index (BMI) 35.0-35.9, adult: Secondary | ICD-10-CM

## 2018-01-04 LAB — BASIC METABOLIC PANEL
ANION GAP: 9 (ref 5–15)
BUN: 20 mg/dL (ref 6–20)
CALCIUM: 9.2 mg/dL (ref 8.9–10.3)
CO2: 26 mmol/L (ref 22–32)
Chloride: 103 mmol/L (ref 98–111)
Creatinine, Ser: 0.74 mg/dL (ref 0.44–1.00)
Glucose, Bld: 85 mg/dL (ref 70–99)
Potassium: 3.9 mmol/L (ref 3.5–5.1)
SODIUM: 138 mmol/L (ref 135–145)

## 2018-01-04 LAB — CBC
HCT: 41.8 % (ref 35.0–47.0)
HEMOGLOBIN: 14.4 g/dL (ref 12.0–16.0)
MCH: 30.4 pg (ref 26.0–34.0)
MCHC: 34.3 g/dL (ref 32.0–36.0)
MCV: 88.7 fL (ref 80.0–100.0)
Platelets: 261 10*3/uL (ref 150–440)
RBC: 4.72 MIL/uL (ref 3.80–5.20)
RDW: 14.2 % (ref 11.5–14.5)
WBC: 7.9 10*3/uL (ref 3.6–11.0)

## 2018-01-04 LAB — TROPONIN I

## 2018-01-04 MED ORDER — ASPIRIN 81 MG PO CHEW
324.0000 mg | CHEWABLE_TABLET | Freq: Once | ORAL | Status: AC
  Administered 2018-01-04: 324 mg via ORAL
  Filled 2018-01-04: qty 4

## 2018-01-04 NOTE — Discharge Instructions (Addendum)

## 2018-01-04 NOTE — ED Triage Notes (Signed)
Pt in with co centralized chest pain states x 3 days. Was here earlier today for the same and all tests were wnl. States pain is now more constant and radiates to back.

## 2018-01-04 NOTE — ED Triage Notes (Signed)
Pt to ED via POV with c/o CP xfew days. Was seen by PCP yesterday and told to follow up with echocardiogram next Friday but pt states she can not wait due to pain being uncomfortable. VSS, NAD noted,

## 2018-01-04 NOTE — ED Provider Notes (Signed)
[facility] Emergency Department Provider Note  ____________________________________________  Time seen: Approximately 3:58 PM  I have reviewed the triage vital signs and the nursing notes.   HISTORY  Chief Complaint Chest Pain   HPI Sherry Torres is a 57 y.o. female with a history of hypertension, diabetes, depression who presents for evaluation of left shoulder pain.  Patient reports 3 days of intermittent left shoulder pain which she describes as a burning sensation, that starts on the left shoulder, radiates across her chest and her upper back, lasts 10 to 15 minutes at a time and resolves without intervention.  Patient has had these episodes at rest and at nighttime.  She reports that the pain wakes her up from her sleep.  She has had several a day.  She denies shortness of breath, dizziness, nausea, vomiting, diaphoresis associated with these episodes.  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  Movement of the arm does not elicit the pain.  She denies any trauma to her shoulder.  She is a smoker.  She has family history of heart attacks in her father.  Patient denies any pain at this time.  Her last episode of pain was while in the waiting room.  She saw her primary care doctor yesterday who did an EKG and that was normal.  She then ordered a echocardiogram however that not happen until next Friday.  Past Medical History:  Diagnosis Date  . Anxiety   . Hypertension   . Recurrent major depressive disorder, in partial remission (HCC) 09/17/2017    Patient Active Problem List   Diagnosis Date Noted  . Muscle spasm of back 09/17/2017  . Recurrent major depressive disorder, in partial remission (HCC) 09/17/2017  . Screening for breast cancer 09/17/2017  . Type 2 diabetes mellitus without complication, without long-term current use of insulin (HCC) 06/23/2017  . Essential hypertension  06/23/2017    History reviewed. No pertinent surgical history.  Prior to Admission medications   Medication Sig Start Date End Date Taking? Authorizing Provider  buPROPion (ZYBAN) 150 MG 12 hr tablet Take 1 tablet (150 mg total) by mouth 2 (two) times daily. 09/17/17   Carlean JewsBoscia, Heather E, NP  cyclobenzaprine (FLEXERIL) 5 MG tablet Take 1 tablet (5 mg total) by mouth 3 (three) times daily as needed for muscle spasms. 09/17/17   Carlean JewsBoscia, Heather E, NP  diclofenac sodium (VOLTAREN) 1 % GEL Apply 4 g topically 4 (four) times daily. 01/03/18   Carlean JewsBoscia, Heather E, NP  hydrochlorothiazide (HYDRODIURIL) 25 MG tablet Take 1 tablet (25 mg total) by mouth daily. 07/23/17   Carlean JewsBoscia, Heather E, NP  meloxicam (MOBIC) 7.5 MG tablet Take 1 tablet (7.5 mg total) by mouth 2 (two) times daily as needed for pain. 12/07/17   Carlean JewsBoscia, Heather E, NP  metFORMIN (GLUCOPHAGE) 500 MG tablet Take 0.5 tablets (250 mg total) by mouth 2 (two) times daily with a meal. 12/08/17   Boscia, Kathlynn GrateHeather E, NP  olmesartan (BENICAR) 5 MG tablet Take 1 tablet (5 mg total) by mouth daily. 12/07/17   Carlean JewsBoscia, Heather E, NP  VENTOLIN HFA 108 (90 Base) MCG/ACT inhaler Inhale 1-2 puffs into the lungs daily. 11/09/17   Carlean JewsBoscia, Heather E, NP    Allergies Codeine  Family History  Problem Relation Age of Onset  . Cancer Mother   . Breast cancer Mother [id number]  . Hyperlipidemia Father  Social History Social History   Tobacco Use  . Smoking status: Current Every Day Smoker    Types: Cigarettes  . Smokeless tobacco: Never Used  Substance Use Topics  . Alcohol use: No    Frequency: Never  . Drug use: No    Review of Systems  Constitutional: Negative for fever. Eyes: Negative for visual changes. ENT: Negative for sore throat. Neck: No neck pain  Cardiovascular: Negative for chest pain. Respiratory: Negative for shortness of breath. Gastrointestinal: Negative for abdominal pain, vomiting or diarrhea. Genitourinary: Negative for  dysuria. Musculoskeletal: Negative for back pain. + left shoulder pain Skin: Negative for rash. Neurological: Negative for headaches, weakness or numbness. Psych: No SI or HI  ____________________________________________   PHYSICAL EXAM:  VITAL SIGNS: ED Triage Vitals  Enc Vitals Group     BP 01/04/18 1254 (!) 145/73     Pulse Rate 01/04/18 1254 78     Resp 01/04/18 1251 16     Temp 01/04/18 1251 98.3 F (36.8 C)     Temp Source 01/04/18 1251 Oral     SpO2 01/04/18 1251 100 %     Weight 01/04/18 1252 190 lb (86.2 kg)     Height 01/04/18 1252 5\' 1"  (1.549 m)     Head Circumference --      Peak Flow --      Pain Score 01/04/18 1252 0     Pain Loc --      Pain Edu? --      Excl. in GC? --     Constitutional: Alert and oriented. Well appearing and in no apparent distress. HEENT:      Head: Normocephalic and atraumatic.         Eyes: Conjunctivae are normal. Sclera is non-icteric.       Mouth/Throat: Mucous membranes are moist.       Neck: Supple with no signs of meningismus. Cardiovascular: Regular rate and rhythm. No murmurs, gallops, or rubs. 2+ symmetrical distal pulses are present in all extremities. No JVD. Respiratory: Normal respiratory effort. Lungs are clear to auscultation bilaterally. No wheezes, crackles, or rhonchi.  Gastrointestinal: Soft, non tender, and non distended with positive bowel sounds. No rebound or guarding. Musculoskeletal: Patient with full painless range of motion of her shoulder, no tenderness with palpation of the left shoulder.  After performing my evaluation of the ROM of the shoulder, patient reports that the shoulder felt achy but not the burning pain she was experiencing. Neurologic: Normal speech and language. Face is symmetric. Moving all extremities. No gross focal neurologic deficits are appreciated. Skin: Skin is warm, dry and intact. No rash noted. Psychiatric: Mood and affect are normal. Speech and behavior are  normal.  ____________________________________________   LABS (all labs ordered are listed, but only abnormal results are displayed)  Labs Reviewed  BASIC METABOLIC PANEL  CBC  TROPONIN I  TROPONIN I   ____________________________________________  EKG  ED ECG REPORT I, Nita Sicklearolina Jerimy Johanson, the attending physician, personally viewed and interpreted this ECG.  Normal sinus rhythm, rate of 91, normal intervals, normal axis, no ST elevations or depressions.  16:02 -normal sinus rhythm, rate of 89, normal intervals, normal axis, T wave inversions in 1 and aVL which were not seen on initial EKG.  No ST elevations or depressions. ____________________________________________  RADIOLOGY  I have personally reviewed the images performed during this visit and I agree with the Radiologist's read.   Interpretation by Radiologist:  Dg Chest 2 View  Result Date: 01/04/2018 CLINICAL  DATA:  c/o CP few days. Was seen by PCP yesterday and told to follow up with echocardiogram next Friday but pt states she can not wait due to pain being uncomfortable EXAM: CHEST - 2 VIEW COMPARISON:  None. FINDINGS: The heart size and mediastinal contours are within normal limits. Both lungs are clear. Degenerative changes are seen in thoracic spine. IMPRESSION: No evidence for acute cardiopulmonary abnormality. Electronically Signed   By: Norva Pavlov M.D.   On: 01/04/2018 13:37   Dg Shoulder Left  Result Date: 01/04/2018 CLINICAL DATA:  57 year old female with chest and left shoulder pain. EXAM: LEFT SHOULDER - 2+ VIEW COMPARISON:  Chest radiographs today reported separately. FINDINGS: Bone mineralization is within normal limits. No glenohumeral joint dislocation. Proximal left humerus intact. Mild to moderate degenerative changes at the left [facility] joint including osteophytosis. The left clavicle and scapula appear intact. Negative visible left ribs and lung parenchyma. IMPRESSION: No acute osseous abnormality  identified. 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 Electronically Signed   By: Odessa Fleming M.D.   On: 01/04/2018 16:22     ____________________________________________   PROCEDURES  Procedure(s) performed: None Procedures Critical Care performed:  None ____________________________________________   INITIAL IMPRESSION / ASSESSMENT AND PLAN / ED COURSE  57 y.o. female with a history of hypertension, diabetes, depression who presents for evaluation of left shoulder pain.  Differential diagnoses including neuropathic pain versus MSK versus ACS. Description not very characteristic of ACS however women tend to present differently. Heart score of 4. Patient had 2 EKGs here showing no obvious ischemic changes. Troponin x 1 negative. 2nd troponin is pending. Labs otherwise with no acute findings.  No clinical signs or symptoms consistent with PE or dissection with intermittent pain, nonpleuritic pain, no risk factors for PE, no severe hypertension, no neurological deficits, normal mediastinum silhouette, normal pulses in all 4 extremities.  X-ray of the shoulder showed AC joint degenerative changes. Will refer patient to Cardiology for further evaluation. ASA given here.     _________________________ 4:57 PM on 01/04/2018 -----------------------------------------  Second troponin is negative.  Will refer patient to Dr. Gwen Pounds.  Discussed return precautions for new or worsening chest pain, shortness of breath, dizziness.   As part of my medical decision making, I reviewed the following data within the electronic MEDICAL RECORD NUMBER Nursing notes reviewed and incorporated, Labs reviewed , EKG interpreted , Old EKG reviewed, Old chart reviewed, Radiograph reviewed , Notes from prior ED visits and [place] Controlled Substance Database    Pertinent labs & imaging results that were available during my care of the patient were reviewed by me and considered in my medical decision making (see chart for  details).    ____________________________________________   FINAL CLINICAL IMPRESSION(S) / ED DIAGNOSES  Final diagnoses:  Acute pain of left shoulder      NEW MEDICATIONS STARTED DURING THIS VISIT:  ED Discharge Orders    None       Note:  This document was prepared using Dragon voice recognition software and may include unintentional dictation errors.    Don Perking, [place], MD 01/04/18 [phone]

## 2018-01-05 ENCOUNTER — Other Ambulatory Visit: Payer: Self-pay

## 2018-01-05 ENCOUNTER — Observation Stay (HOSPITAL_COMMUNITY)
Admit: 2018-01-05 | Discharge: 2018-01-05 | Disposition: A | Discharge status: Home / Self Care | Payer: BLUE CROSS/BLUE SHIELD | Attending: Internal Medicine | Admitting: Internal Medicine

## 2018-01-05 ENCOUNTER — Inpatient Hospital Stay
Admission: EM | Admit: 2018-01-05 | Discharge: 2018-01-07 | DRG: 282 | Disposition: A | Discharge status: Home / Self Care | Payer: BLUE CROSS/BLUE SHIELD | Attending: Internal Medicine | Admitting: Internal Medicine

## 2018-01-05 ENCOUNTER — Emergency Department: Payer: BLUE CROSS/BLUE SHIELD

## 2018-01-05 ENCOUNTER — Observation Stay (HOSPITAL_BASED_OUTPATIENT_CLINIC_OR_DEPARTMENT_OTHER): Payer: BLUE CROSS/BLUE SHIELD

## 2018-01-05 DIAGNOSIS — E785 Hyperlipidemia, unspecified: Secondary | ICD-10-CM

## 2018-01-05 DIAGNOSIS — E118 Type 2 diabetes mellitus with unspecified complications: Secondary | ICD-10-CM

## 2018-01-05 DIAGNOSIS — I10 Essential (primary) hypertension: Secondary | ICD-10-CM

## 2018-01-05 DIAGNOSIS — R079 Chest pain, unspecified: Secondary | ICD-10-CM

## 2018-01-05 DIAGNOSIS — I214 Non-ST elevation (NSTEMI) myocardial infarction: Secondary | ICD-10-CM

## 2018-01-05 HISTORY — DX: Unspecified asthma, uncomplicated: J45.909

## 2018-01-05 LAB — TROPONIN I
TROPONIN I: 0.1 ng/mL — AB (ref <0.03)
TROPONIN I: 0.13 ng/mL — AB (ref <0.03)
Troponin I: 0.19 ng/mL (ref <0.03)

## 2018-01-05 LAB — CBC
HEMATOCRIT: 40.9 % (ref 35.0–47.0)
HEMOGLOBIN: 14.1 g/dL (ref 12.0–16.0)
MCH: 30.7 pg (ref 26.0–34.0)
MCHC: 34.6 g/dL (ref 32.0–36.0)
MCV: 88.7 fL (ref 80.0–100.0)
PLATELETS: 257 10*3/uL (ref 150–440)
RBC: 4.61 MIL/uL (ref 3.80–5.20)
RDW: 14.7 % — ABNORMAL HIGH (ref 11.5–14.5)
WBC: 10 10*3/uL (ref 3.6–11.0)

## 2018-01-05 LAB — LIPID PANEL
CHOL/HDL RATIO: 5 ratio
Cholesterol: 196 mg/dL (ref 0–200)
HDL: 39 mg/dL — AB (ref >40)
LDL Cholesterol: 113 mg/dL — ABNORMAL HIGH (ref 0–99)
Triglycerides: 220 mg/dL — ABNORMAL HIGH (ref <150)
VLDL: 44 mg/dL — ABNORMAL HIGH (ref 0–40)

## 2018-01-05 LAB — GLUCOSE, CAPILLARY
GLUCOSE-CAPILLARY: 103 mg/dL — AB (ref 70–99)
Glucose-Capillary: 113 mg/dL — ABNORMAL HIGH (ref 70–99)
Glucose-Capillary: 185 mg/dL — ABNORMAL HIGH (ref 70–99)

## 2018-01-05 LAB — NM MYOCAR MULTI W/SPECT W/WALL MOTION / EF
CHL CUP RESTING HR STRESS: 79 {beats}/min
LV sys vol: 27 mL
LVDIAVOL: 62 mL (ref 46–106)
Peak HR: 136 {beats}/min
Percent HR: 83 %
TID: 1.08

## 2018-01-05 LAB — COMPREHENSIVE METABOLIC PANEL
ALK PHOS: 54 U/L (ref 38–126)
ALT: 16 U/L (ref 0–44)
AST: 16 U/L (ref 15–41)
Albumin: 4 g/dL (ref 3.5–5.0)
Anion gap: 7 (ref 5–15)
BUN: 22 mg/dL — AB (ref 6–20)
CALCIUM: 9.1 mg/dL (ref 8.9–10.3)
CHLORIDE: 103 mmol/L (ref 98–111)
CO2: 28 mmol/L (ref 22–32)
CREATININE: 0.85 mg/dL (ref 0.44–1.00)
Glucose, Bld: 139 mg/dL — ABNORMAL HIGH (ref 70–99)
Potassium: 4.2 mmol/L (ref 3.5–5.1)
Sodium: 138 mmol/L (ref 135–145)
Total Bilirubin: 0.2 mg/dL — ABNORMAL LOW (ref 0.3–1.2)
Total Protein: 7.6 g/dL (ref 6.5–8.1)

## 2018-01-05 LAB — PROTIME-INR
INR: 0.97
Prothrombin Time: 12.8 seconds (ref 11.4–15.2)

## 2018-01-05 LAB — HEPARIN LEVEL (UNFRACTIONATED): Heparin Unfractionated: 0.32 IU/mL (ref 0.30–0.70)

## 2018-01-05 LAB — APTT: aPTT: 34 seconds (ref 24–36)

## 2018-01-05 MED ORDER — HEPARIN BOLUS VIA INFUSION
2000.0000 [IU] | Freq: Once | INTRAVENOUS | Status: AC
  Administered 2018-01-05: 2000 [IU] via INTRAVENOUS
  Filled 2018-01-05: qty 2000

## 2018-01-05 MED ORDER — INSULIN ASPART 100 UNIT/ML ~~LOC~~ SOLN: 0.0000 [IU] | Freq: Every day | SUBCUTANEOUS | Status: DC

## 2018-01-05 MED ORDER — ASPIRIN 81 MG PO CHEW: 81.0000 mg | CHEWABLE_TABLET | Freq: Every day | ORAL | Status: DC

## 2018-01-05 MED ORDER — HEPARIN (PORCINE) IN NACL 100-0.45 UNIT/ML-% IJ SOLN
1200.0000 [IU]/h | INTRAMUSCULAR | Status: DC
  Administered 2018-01-05: 800 [IU]/h via INTRAVENOUS
  Filled 2018-01-05: qty 250

## 2018-01-05 MED ORDER — ONDANSETRON HCL 4 MG/2ML IJ SOLN
INTRAMUSCULAR | Status: AC
  Filled 2018-01-05: qty 2

## 2018-01-05 MED ORDER — ASPIRIN 81 MG PO CHEW
81.0000 mg | CHEWABLE_TABLET | Freq: Every day | ORAL | Status: DC
  Administered 2018-01-06 – 2018-01-07 (×2): 81 mg via ORAL
  Filled 2018-01-05: qty 1

## 2018-01-05 MED ORDER — IRBESARTAN 75 MG PO TABS
37.5000 mg | ORAL_TABLET | Freq: Every day | ORAL | Status: DC
  Administered 2018-01-05 – 2018-01-07 (×3): 37.5 mg via ORAL
  Filled 2018-01-05 (×4): qty 0.5

## 2018-01-05 MED ORDER — REGADENOSON 0.4 MG/5ML IV SOLN
0.4000 mg | Freq: Once | INTRAVENOUS | Status: AC
  Administered 2018-01-05: 0.4 mg via INTRAVENOUS

## 2018-01-05 MED ORDER — ONDANSETRON HCL 4 MG/2ML IJ SOLN
4.0000 mg | Freq: Once | INTRAMUSCULAR | Status: AC
  Administered 2018-01-05: 4 mg via INTRAVENOUS

## 2018-01-05 MED ORDER — MORPHINE SULFATE (PF) 2 MG/ML IV SOLN
2.0000 mg | Freq: Once | INTRAVENOUS | Status: AC
  Administered 2018-01-05: 2 mg via INTRAVENOUS

## 2018-01-05 MED ORDER — CYCLOBENZAPRINE HCL 10 MG PO TABS: 5.0000 mg | ORAL_TABLET | Freq: Three times a day (TID) | ORAL | Status: DC | PRN

## 2018-01-05 MED ORDER — HYDROCHLOROTHIAZIDE 25 MG PO TABS: 25.0000 mg | ORAL_TABLET | Freq: Every day | ORAL | Status: DC

## 2018-01-05 MED ORDER — ASPIRIN 81 MG PO CHEW: 324.0000 mg | CHEWABLE_TABLET | Freq: Every day | ORAL | Status: DC

## 2018-01-05 MED ORDER — ATORVASTATIN CALCIUM 20 MG PO TABS
40.0000 mg | ORAL_TABLET | Freq: Every day | ORAL | Status: DC
  Administered 2018-01-05: 40 mg via ORAL
  Filled 2018-01-05: qty 2

## 2018-01-05 MED ORDER — ACETAMINOPHEN 325 MG PO TABS: 650.0000 mg | ORAL_TABLET | ORAL | Status: DC | PRN

## 2018-01-05 MED ORDER — INSULIN ASPART 100 UNIT/ML ~~LOC~~ SOLN
0.0000 [IU] | Freq: Three times a day (TID) | SUBCUTANEOUS | Status: DC
  Administered 2018-01-07: 2 [IU] via SUBCUTANEOUS
  Filled 2018-01-05: qty 1

## 2018-01-05 MED ORDER — TECHNETIUM TC 99M TETROFOSMIN IV KIT
29.1190 | PACK | Freq: Once | INTRAVENOUS | Status: AC | PRN
  Administered 2018-01-05: 29.119 via INTRAVENOUS

## 2018-01-05 MED ORDER — MORPHINE SULFATE (PF) 2 MG/ML IV SOLN
INTRAVENOUS | Status: AC
  Filled 2018-01-05: qty 1

## 2018-01-05 MED ORDER — IOPAMIDOL (ISOVUE-370) INJECTION 76%
75.0000 mL | Freq: Once | INTRAVENOUS | Status: AC | PRN
  Administered 2018-01-05: 75 mL via INTRAVENOUS

## 2018-01-05 MED ORDER — MORPHINE SULFATE (PF) 2 MG/ML IV SOLN
2.0000 mg | INTRAVENOUS | Status: DC | PRN
  Administered 2018-01-05 – 2018-01-06 (×5): 2 mg via INTRAVENOUS
  Filled 2018-01-05 (×5): qty 1

## 2018-01-05 MED ORDER — BUPROPION HCL ER (SR) 150 MG PO TB12
150.0000 mg | ORAL_TABLET | Freq: Two times a day (BID) | ORAL | Status: DC
  Administered 2018-01-05 – 2018-01-07 (×3): 150 mg via ORAL
  Filled 2018-01-05 (×8): qty 1

## 2018-01-05 MED ORDER — ENOXAPARIN SODIUM 40 MG/0.4ML ~~LOC~~ SOLN
40.0000 mg | SUBCUTANEOUS | Status: DC
  Administered 2018-01-05: 40 mg via SUBCUTANEOUS
  Filled 2018-01-05: qty 0.4

## 2018-01-05 MED ORDER — ONDANSETRON HCL 4 MG/2ML IJ SOLN: 4.0000 mg | Freq: Four times a day (QID) | INTRAMUSCULAR | Status: DC | PRN

## 2018-01-05 MED ORDER — BISACODYL 5 MG PO TBEC
5.0000 mg | DELAYED_RELEASE_TABLET | Freq: Every day | ORAL | Status: DC | PRN
  Filled 2018-01-05: qty 1

## 2018-01-05 MED ORDER — NICOTINE POLACRILEX 2 MG MT GUM
2.0000 mg | CHEWING_GUM | OROMUCOSAL | Status: DC | PRN
  Filled 2018-01-05: qty 1

## 2018-01-05 MED ORDER — TECHNETIUM TC 99M TETROFOSMIN IV KIT
13.4910 | PACK | Freq: Once | INTRAVENOUS | Status: AC | PRN
  Administered 2018-01-05: 13.491 via INTRAVENOUS

## 2018-01-05 MED ORDER — SENNOSIDES-DOCUSATE SODIUM 8.6-50 MG PO TABS: 1.0000 | ORAL_TABLET | Freq: Every evening | ORAL | Status: DC | PRN

## 2018-01-05 MED ORDER — SODIUM CHLORIDE 0.9 % IV BOLUS
1000.0000 mL | Freq: Once | INTRAVENOUS | Status: AC
  Administered 2018-01-05: 1000 mL via INTRAVENOUS

## 2018-01-05 MED ORDER — ALBUTEROL SULFATE (2.5 MG/3ML) 0.083% IN NEBU: 2.5000 mg | INHALATION_SOLUTION | Freq: Four times a day (QID) | RESPIRATORY_TRACT | Status: DC | PRN

## 2018-01-05 MED ORDER — NITROGLYCERIN 0.4 MG SL SUBL
0.4000 mg | SUBLINGUAL_TABLET | SUBLINGUAL | Status: DC | PRN
  Filled 2018-01-05: qty 1

## 2018-01-05 NOTE — Telephone Encounter (Signed)
Good. That's what I told her to do when she was here Monday.

## 2018-01-05 NOTE — Progress Notes (Signed)
Patient admitted to unit. Oriented to room, call bell, and staff. Bed in lowest position. Fall safety plan reviewed. Full assessment to Epic. Skin assessment verified with Ashly RN. Telemetry box verification with tele clerk and Keda NT- Box#: ---[id number]--. Will continue to monitor. Heparin gtt verified with ED RN, Aundra MilletMegan on arrival.

## 2018-01-05 NOTE — ED Notes (Signed)
This RN attempted to give Nitro due to patient c/o chest pain 8/10. Pt states, "but they've been giving me pain medication, what if this isn't my heart?" Pt appeared tearful, repeatedly asking for something for pain despite this RN attempting to explain that Nitro was specifically for chest pain. Pt states that she has never had nitro. 2mg  Morphine given to see if that alleviates pain.

## 2018-01-05 NOTE — ED Notes (Signed)
Date and time results received: 01/05/18 9:31 AM  (use smartphrase ".now" to insert current time)  Test: Trop Critical Value: 0.10  Name of Provider Notified: Dr. Imogene Burnhen  Orders Received? Or Actions Taken?: Critical Results Acknowledged

## 2018-01-05 NOTE — ED Notes (Signed)
Pt returned from Nuc Med

## 2018-01-05 NOTE — ED Notes (Signed)
This RN to bedside, introduced self to patient and family member at bedside. This RN apologized for having to move patient to a different room, pt states understanding. Pt is alert and oriented, pt up to the bathroom without assistance or difficulty. Will continue to monitor for further patient needs.

## 2018-01-05 NOTE — Progress Notes (Signed)
ANTICOAGULATION CONSULT NOTE - Initial Consult  Pharmacy Consult for heparin Indication: chest pain/ACS  Allergies  Allergen Reactions  . Codeine Itching    Patient Measurements: Height: 5\' 2"  (157.5 cm) Weight: 194 lb (88 kg) IBW/kg (Calculated) : 50.1 Heparin Dosing Weight: 69.7 kg  Vital Signs: Temp: 98.3 F (36.8 C) (08/21 1509) Temp Source: Oral (08/21 1509) BP: 130/66 (08/21 1509) Pulse Rate: 84 (08/21 1509)  Labs: Recent Labs    01/04/18 1258  01/05/18 0003 01/05/18 0840 01/05/18 1159 01/05/18 1805  HGB 14.4  --  14.1  --   --   --   HCT 41.8  --  40.9  --   --   --   PLT 261  --  257  --   --   --   APTT  --   --   --   --  34  --   LABPROT  --   --   --   --  12.8  --   INR  --   --   --   --  0.97  --   HEPARINUNFRC  --   --   --   --   --  0.32  CREATININE 0.74  --  0.85  --   --   --   TROPONINI <0.03   < > <0.03 0.10* 0.13* 0.19*   < > = values in this interval not displayed.    Estimated Creatinine Clearance: 75.3 mL/min (by C-G formula based on SCr of 0.85 mg/dL).   Medical History: Past Medical History:  Diagnosis Date  . Anxiety   . Asthma   . Hypertension   . Recurrent major depressive disorder, in partial remission (HCC) 09/17/2017   Assessment: 57 year old female admitted with chest pain. Pharmacy consulted for dosing of heparin for chest pain/ACS. Pt received one dose of prophylactic Lovenox 40 mg at approximately 0900  Goal of Therapy:  Heparin level 0.3-0.7 units/ml Monitor platelets by anticoagulation protocol: Yes   Plan:  Will continue heparin infusion at 800 units/hr and recheck a HL in 6 hours.   CBC ordered with morning labs.  Pharmacy will continue to monitor.  Luisa Harthristy, Jhene [place]moreland D, PharmD Pharmacy Resident  01/05/2018 7:08 PM

## 2018-01-05 NOTE — ED Notes (Signed)
Pt taken to Nuclear Medicine for stress test at this time.

## 2018-01-05 NOTE — Consult Note (Signed)
Cardiology Consultation:   Patient ID: Sherry Torres; [id number]; 02/24/1961   Admit date: 01/05/2018 Date of Consult: 01/05/2018  Primary Care Provider: Carlean JewsBoscia, Heather E, NP Primary Cardiologist: New to [facility] - consult by End   Patient Profile:   Sherry Torres is a 57 y.o. female with a hx of DM2, HTN, obesity, asthma, ongoing tobacco abuse, and anxiety who is being seen today for the evaluation of chest pain at the request of Dr. Imogene Burnhen.  History of Present Illness:   Sherry Torres does not have any previously known cardiac history. She was in her usual state of health until late the prior week, around 8/15 when she was doing some house cleaning and developed substernal chest pressure that radiated up to her neck, left-sided jaw, and left shoulder. Pain would last about 10 minutes and improve with rest. Pain has been intermittent, though exertion consistently brings on her symptoms. She was seen by her PCP on 8/19 for this pain and an echo was ordered for later this week. Because her pain persisted she [facility] as she did not want to wait until 8/23 for evaluation with an echo.   Patient father had an MI in his 1670s. She continues to smoke ~ 0.75 pack daily and has done so for "many years." She rarely drinks alcohol and denies illegal drugs.   Upon the [facility]'s [facility] they were found to have BP 191/86 that has since improved to 130/66, HR 83 bpm, temp 97.5, oxygen saturation 100% on room air, weight 82.6 kg. EKGs below, CXR showed no acute process. Left shoulder plain film showed no acute process. CTA chest was negative for PE or aortic dissection with clear lung fields. Labs showed troponin negative x 3 followed by continued cycling showing 0.10-->0.13. LDL 113, SCr 0.85, K+ 4.2, WBC 10.0, HGB 14.1, PLT 257. IM ordered a Myoview that showed a small in size, mild in severity, reversible defect involving the basal and mid inferolateral segments concerning  for ischemia, though cannot rule out artifact. LVSF appeared grossly normal. This was read as an abnormal, probably low risk Myoview. Cardiology has been asked to evaluate given elevated troponin and abnormal Myoview. Currently, chest pain free  Past Medical History:  Diagnosis Date  . Anxiety   . Asthma   . Hypertension   . Recurrent major depressive disorder, in partial remission (HCC) 09/17/2017    History reviewed. No pertinent surgical history.   Home Meds: Prior to Admission medications   Medication Sig Start Date End Date Taking? Authorizing Provider  buPROPion (ZYBAN) 150 MG 12 hr tablet Take 1 tablet (150 mg total) by mouth 2 (two) times daily. 09/17/17  Yes Boscia, Kathlynn GrateHeather E, NP  cyclobenzaprine (FLEXERIL) 5 MG tablet Take 1 tablet (5 mg total) by mouth 3 (three) times daily as needed for muscle spasms. 09/17/17  Yes Boscia, Heather E, NP  hydrochlorothiazide (HYDRODIURIL) 25 MG tablet Take 1 tablet (25 mg total) by mouth daily. 07/23/17  Yes Boscia, Kathlynn GrateHeather E, NP  meloxicam (MOBIC) 7.5 MG tablet Take 1 tablet (7.5 mg total) by mouth 2 (two) times daily as needed for pain. 12/07/17  Yes Carlean JewsBoscia, Heather E, NP  metFORMIN (GLUCOPHAGE) 500 MG tablet Take 0.5 tablets (250 mg total) by mouth 2 (two) times daily with a meal. 12/08/17  Yes Boscia, Heather E, NP  olmesartan (BENICAR) 5 MG tablet Take 1 tablet (5 mg total) by mouth daily. 12/07/17  Yes Carlean JewsBoscia, Heather E,  NP  VENTOLIN HFA 108 (90 Base) MCG/ACT inhaler Inhale 1-2 puffs into the lungs daily. 11/09/17  Yes Boscia, Heather E, NP  diclofenac sodium (VOLTAREN) 1 % GEL Apply 4 g topically 4 (four) times daily. 01/03/18   Carlean Jews, NP    Inpatient Medications: Scheduled Meds: . aspirin  324 mg Oral Daily  . atorvastatin  40 mg Oral q1800  . buPROPion  150 mg Oral BID  . hydrochlorothiazide  25 mg Oral Daily  . insulin aspart  0-15 Units Subcutaneous TID WC  . insulin aspart  0-5 Units Subcutaneous QHS  . irbesartan  37.5 mg Oral  Daily   Continuous Infusions: . heparin 800 Units/hr (01/05/18 1507)   PRN Meds: acetaminophen, albuterol, bisacodyl, cyclobenzaprine, morphine injection, nitroGLYCERIN, ondansetron (ZOFRAN) IV, senna-docusate  Allergies:   Allergies  Allergen Reactions  . Codeine Itching    Social History:   Social History   Socioeconomic History  . Marital status: Married    Spouse name: Not on file  . Number of children: Not on file  . Years of education: Not on file  . Highest education level: Not on file  Occupational History  . Not on file  Social Needs  . Financial resource strain: Not on file  . Food insecurity:    Worry: Not on file    Inability: Not on file  . Transportation needs:    Medical: Not on file    Non-medical: Not on file  Tobacco Use  . Smoking status: Current Every Day Smoker    Types: Cigarettes  . Smokeless tobacco: Never Used  Substance and Sexual Activity  . Alcohol use: No    Frequency: Never  . Drug use: No  . Sexual activity: Not on file  Lifestyle  . Physical activity:    Days per week: Not on file    Minutes per session: Not on file  . Stress: Not on file  Relationships  . Social connections:    Talks on phone: Not on file    Gets together: Not on file    Attends religious service: Not on file    Active member of club or organization: Not on file    Attends meetings of clubs or organizations: Not on file    Relationship status: Not on file  . Intimate partner violence:    Fear of current or ex partner: Not on file    Emotionally abused: Not on file    Physically abused: Not on file    Forced sexual activity: Not on file  Other Topics Concern  . Not on file  Social History Narrative  . Not on file     Family History:   Family History  Problem Relation Age of Onset  . Cancer Mother   . Breast cancer Mother 8  . Hyperlipidemia Father     ROS:  Review of Systems  Constitutional: Positive for malaise/fatigue. Negative for chills,  diaphoresis, fever and weight loss.  HENT: Negative for congestion.   Eyes: Negative for discharge and redness.  Respiratory: Positive for shortness of breath. Negative for cough, hemoptysis, sputum production and wheezing.   Cardiovascular: Positive for chest pain. Negative for palpitations, orthopnea, claudication, leg swelling and PND.  Gastrointestinal: Negative for abdominal pain, blood in stool, heartburn, melena, nausea and vomiting.  Genitourinary: Negative for hematuria.  Musculoskeletal: Negative for falls and myalgias.  Skin: Negative for rash.  Neurological: Positive for weakness. Negative for dizziness, tingling, tremors, sensory change, speech change, focal  weakness and loss of consciousness.  Endo/Heme/Allergies: Does not bruise/bleed easily.  Psychiatric/Behavioral: Negative for substance abuse. The patient is not nervous/anxious.   All other systems reviewed and are negative.     Physical Exam/Data:   Vitals:   01/05/18 1230 01/05/18 1231 01/05/18 1300 01/05/18 1509  BP: 137/69 137/69 136/69 130/66  Pulse: 73 67 97 84  Resp: 19 (!) 22    Temp:    98.3 F (36.8 C)  TempSrc:    Oral  SpO2: 100% 96% 97% 97%  Weight:      Height:        Intake/Output Summary (Last 24 hours) at 01/05/2018 1519 Last data filed at 01/05/2018 1507 Gross per 24 hour  Intake 1024.99 ml  Output -  Net 1024.99 ml   Filed Weights   01/04/18 2359  Weight: 86.2 kg   Body mass index is 34.75 kg/m.   Physical Exam: General: Well developed, well nourished, in no acute distress. Head: Normocephalic, atraumatic, sclera non-icteric, no xanthomas, nares without discharge.  Neck: Negative for carotid bruits. JVD not elevated. Lungs: Mild bilateral wheezing. Breathing is unlabored. Heart: RRR with S1 S2. No murmurs, rubs, or gallops appreciated. Abdomen: Soft, non-tender, non-distended with normoactive bowel sounds. No hepatomegaly. No rebound/guarding. No obvious abdominal masses. Msk:   Strength and tone appear normal for age. Extremities: No clubbing or cyanosis. No edema. Distal pedal pulses are 2+ and equal bilaterally. Neuro: Alert and oriented X 3. No facial asymmetry. No focal deficit. Moves all extremities spontaneously. Psych:  Responds to questions appropriately with a normal affect.   EKG:  The EKG was personally reviewed and demonstrates: 8/20 at 12:50 - NSR, 91 bpm, nonspecific inferolateral st/t changes. 8/20 at 16:02 - NSR, 89 bpm, IVCD, nonspecific inferior st/t changes, lateral TWI. 8/20 at 23:58 - NSR, 84 bpm, diffuse nonspecific st/t changes. 8/21 at 2:35 - NSR, 95 bpm, down sloping to horizontal inferior and anterolateral st/t depression  Telemetry:  Telemetry was personally reviewed and demonstrates: NSR  Weights: [employer] Weights   01/04/18 2359  Weight: 86.2 kg    Relevant CV Studies:  Myoview 01/05/2018:  Abnormal, probably low risk myocardial perfusion stress test.  There is a small in size, mild in severity, reversible defect involving the basal and mid inferolateral segments concerning for ischemia but cannot rule out artifact (attenuation and misregistration).  Left ventricular systolic function appears grossly normal, though EF cannot be calculated due to significant extracardiac activity. __________  Laboratory Data:  Chemistry Recent Labs  Lab 01/04/18 1258 01/05/18 0003  NA 138 138  K 3.9 4.2  CL 103 103  CO2 26 28  GLUCOSE 85 139*  BUN 20 22*  CREATININE 0.74 0.85  CALCIUM 9.2 9.1  GFRNONAA >60 >60  GFRAA >60 >60  ANIONGAP 9 7    Recent Labs  Lab 01/05/18 0003  PROT 7.6  ALBUMIN 4.0  AST 16  ALT 16  ALKPHOS 54  BILITOT 0.2*   Hematology Recent Labs  Lab 01/04/18 1258 01/05/18 0003  WBC 7.9 10.0  RBC 4.72 4.61  HGB 14.4 14.1  HCT 41.8 40.9  MCV 88.7 88.7  MCH 30.4 30.7  MCHC 34.3 34.6  RDW 14.2 14.7*  PLT 261 257   Cardiac Enzymes Recent Labs  Lab 01/04/18 1258 01/04/18 1603 01/05/18 0003  01/05/18 0840 01/05/18 1159  TROPONINI <0.03 <0.03 <0.03 0.10* 0.13*   No results for input(s): TROPIPOC in the last 168 hours.  BNPNo results for input(s): BNP, PROBNP in  the last 168 hours.  DDimer No results for input(s): DDIMER in the last 168 hours.  Radiology/Studies:  Dg Chest 2 View  Result Date: 01/04/2018 IMPRESSION: No evidence for acute cardiopulmonary abnormality. Electronically Signed   By: Norva Pavlov M.D.   On: 01/04/2018 13:37   Ct Angio Chest Pe W And/or Wo Contrast  Result Date: 01/05/2018 IMPRESSION: Negative for acute pulmonary embolus or aortic dissection. Clear lung fields. Electronically Signed   By: Jasmine Pang M.D.   On: 01/05/2018 02:42   Dg Shoulder Left  Result Date: 01/04/2018 IMPRESSION: No acute osseous abnormality identified. 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 Electronically Signed   By: Odessa Fleming M.D.   On: 01/04/2018 16:22    Assessment and Plan:   1. Elevated troponin/abnormal Myoview/unstable angina: -Currently, chest pain free, though did note some mild discomfort after discussing LHC procedure (RN notified) -Troponin of 0.13 currently, continue to cycle until peak -Hydrate overnight given recent CTA chest -Heparin gtt -ASA -Lipitor -Not on beta blocker currently with underlying asthma/wheezing -Risks and benefits of cardiac catheterization have been discussed with the patient including risks of bleeding, bruising, infection, kidney damage, stroke, heart attack, and death. The patient understands these risks and is willing to proceed with the procedure. All questions have been answered and concerns listened to  2. HTN: -Blood pressure is improved -Irbesartan -Hold HCTZ  3. HLD: -LDL 113 upon admission -Agree with IM starting Lipitor 40 mg daily -Needs recheck lipid and liver function in 8-12 weeks as an outpatient   4. DM2: -Metformin on hold (will need to hold 48 hours post cath) -SSI per IM  5. Tobacco  abuse: -Cessation is advised  6. Obesity: -Weight loss advised   For questions or updates, please contact [facility] Please consult [email or web address] for contact info under Cardiology/STEMI.   Signed, Eula Listen, PA-C [facility] Pager: [phone] 01/05/2018, 3:19 PM

## 2018-01-05 NOTE — Progress Notes (Signed)
Chest pain resolved.  Troponin is elevated up to 0.1 then 0.13. Vital signs are stable. Physical examination is unremarkable. AP:  Chest pain with elevated troponin, possible ACS. Start heparin drip, aspirin and Lipitor, cardiology consult.  Follow-up stress test report.  Hyperlipidemia.  LDL 113.  Start Lipitor. Diabetes sliding scale. Hypertension.  Continue hypertension medication. Tobacco abuse.  Smoking cessation was counseled for 3 to 4 minutes.  Patient wants to quit. I discussed with patient and her husband.  Time spent about 28 minutes.

## 2018-01-05 NOTE — H&P (Signed)
[facility] - [place] [facility]   PATIENT NAME: Sherry Torres    MR#:  [id number]  DATE OF BIRTH:  05/09/1961  DATE OF ADMISSION:  01/05/2018  PRIMARY CARE PHYSICIAN: Carlean JewsBoscia, Heather E, NP   REQUESTING/REFERRING PHYSICIAN: Darci CurrentBrown,  N, MD  CHIEF COMPLAINT:   Chief Complaint  Patient presents with  . Chest Pain    HISTORY OF PRESENT ILLNESS:  Sherry Torres  is a 57 y.o. female with a known history of obesity, T2NIDDM, HTN, tobacco use D/O who p/w CP. Pt states that she believes initial onset of CP was late last week, possibly on Thursday 08/15. It was very mild. She states that it got worse late in the weekend (Sun 08/18) in the afternoon. Pt unable to characterize pain well, other than to say it is "just pain", but she says it originates in the L shoulder, then radiates across the upper chest (left to right) and into the R neck. The pain is non-positional, non-reproducible w/ palpation and non-pleuritic. She states that the pain worsens w/ exertion and improves w/ rest. She states the pain occurs predominantly at night, and is typically better during the day. She states she been woken up from sleep 2-3x/night for the past 4 or so nights w/ CP. She states that she will typically sit up for 5-8010min, w/ improvement of pain. She denies SOB, palpitations, diaphoresis, N/V, LH/LOC. She states that on Monday 08/19, she called her PCP office and had an acute visit. EKG was OK. Pt was ordered for Echo and sent home. She had worsening pain on Monday evening into Tuesday. She asked her daughter to bring her to the ED. Serial troponins, CXR and EKG were (-), and pt was discharged home. She again had recurrent symptoms at home, and she had her husband bring her back to the ED. Pt is comfortable, well-appearing and in no acute distress at the time of my assessment. She has not had a stress test or seen a cardiologist in the past. She does not take ASA at home. Her father  died of CAD/MI in his 5470s. She is a smoker.  PAST MEDICAL HISTORY:   Past Medical History:  Diagnosis Date  . Anxiety   . Asthma   . Hypertension   . Recurrent major depressive disorder, in partial remission (HCC) 09/17/2017    PAST SURGICAL HISTORY:  History reviewed. No pertinent surgical history.  SOCIAL HISTORY:   Social History   Tobacco Use  . Smoking status: Current Every Day Smoker    Types: Cigarettes  . Smokeless tobacco: Never Used  Substance Use Topics  . Alcohol use: No    Frequency: Never    FAMILY HISTORY:   Family History  Problem Relation Age of Onset  . Cancer Mother   . Breast cancer Mother [id number]  . Hyperlipidemia Father     DRUG ALLERGIES:   Allergies  Allergen Reactions  . Codeine Itching    REVIEW OF SYSTEMS:   Review of Systems  Constitutional: Negative for chills, diaphoresis, fever, malaise/fatigue and weight loss.  HENT: Negative for congestion, ear pain, hearing loss, nosebleeds, sinus pain, sore throat and tinnitus.   Eyes: Negative for blurred vision, double vision and photophobia.  Respiratory: Negative for cough, hemoptysis, sputum production, shortness of breath and wheezing.   Cardiovascular: Positive for chest pain. Negative for palpitations, orthopnea, claudication, leg swelling and PND.  Gastrointestinal: Negative for abdominal pain, blood in stool, constipation, diarrhea, heartburn, melena, nausea and vomiting.  Genitourinary: Negative for dysuria, frequency, hematuria and urgency.  Musculoskeletal: Positive for joint pain (L shoulder pain) and neck pain.  Skin: Negative for itching and rash.  Neurological: Negative for dizziness, tingling, tremors, sensory change, speech change, focal weakness, seizures, loss of consciousness, weakness and headaches.  Psychiatric/Behavioral: Negative for memory loss. The patient does not have insomnia.    MEDICATIONS AT HOME:   Prior to Admission medications   Medication Sig Start Date End  Date Taking? Authorizing Provider  buPROPion (ZYBAN) 150 MG 12 hr tablet Take 1 tablet (150 mg total) by mouth 2 (two) times daily. 09/17/17  Yes Boscia, Kathlynn Grate, NP  cyclobenzaprine (FLEXERIL) 5 MG tablet Take 1 tablet (5 mg total) by mouth 3 (three) times daily as needed for muscle spasms. 09/17/17  Yes Boscia, Heather E, NP  hydrochlorothiazide (HYDRODIURIL) 25 MG tablet Take 1 tablet (25 mg total) by mouth daily. 07/23/17  Yes Boscia, Kathlynn Grate, NP  meloxicam (MOBIC) 7.5 MG tablet Take 1 tablet (7.5 mg total) by mouth 2 (two) times daily as needed for pain. 12/07/17  Yes Carlean Jews, NP  metFORMIN (GLUCOPHAGE) 500 MG tablet Take 0.5 tablets (250 mg total) by mouth 2 (two) times daily with a meal. 12/08/17  Yes Boscia, Heather E, NP  olmesartan (BENICAR) 5 MG tablet Take 1 tablet (5 mg total) by mouth daily. 12/07/17  Yes Boscia, Heather E, NP  VENTOLIN HFA 108 (90 Base) MCG/ACT inhaler Inhale 1-2 puffs into the lungs daily. 11/09/17  Yes Boscia, Heather E, NP  diclofenac sodium (VOLTAREN) 1 % GEL Apply 4 g topically 4 (four) times daily. 01/03/18   Carlean Jews, NP      VITAL SIGNS:  Blood pressure (!) 156/71, pulse 76, temperature (!) 97.5 F (36.4 C), temperature source Oral, resp. rate 14, height 5\' 2"  (1.575 m), weight 86.2 kg, SpO2 97 %.  PHYSICAL EXAMINATION:  Physical Exam  Constitutional: She is oriented to person, place, and time. She appears well-developed and well-nourished. She is active and cooperative.  Non-toxic appearance. She does not have a sickly appearance. She does not appear ill. No distress. She is not intubated.  HENT:  Head: Normocephalic and atraumatic.  Mouth/Throat: Oropharynx is clear and moist. No oropharyngeal exudate.  Eyes: EOM and lids are normal. No scleral icterus.  Neck: Neck supple. No JVD present. No thyromegaly present.  Cardiovascular: Normal rate, regular rhythm, S1 normal, S2 normal and normal heart sounds.  No extrasystoles are present. Exam  reveals no gallop, no S3, no S4, no distant heart sounds and no friction rub.  No murmur heard. Pulmonary/Chest: Effort normal and breath sounds normal. No accessory muscle usage or stridor. No apnea, no tachypnea and no bradypnea. She is not intubated. No respiratory distress. She has no decreased breath sounds. She has no wheezes. She has no rhonchi. She has no rales.  Abdominal: Soft. Bowel sounds are normal. She exhibits no distension. There is no tenderness. There is no rigidity, no rebound and no guarding.  Musculoskeletal: Normal range of motion. She exhibits no edema or tenderness.       Right lower leg: Normal. She exhibits no tenderness and no edema.       Left lower leg: Normal. She exhibits no tenderness and no edema.  Lymphadenopathy:    She has no cervical adenopathy.  Neurological: She is alert and oriented to person, place, and time. She is not disoriented.  Skin: Skin is warm, dry and intact. No rash noted. She is  not diaphoretic. No erythema.  Psychiatric: She has a normal mood and affect. Her speech is normal and behavior is normal. Judgment and thought content normal. Her mood appears not anxious. She is not agitated. Cognition and memory are normal.   LABORATORY PANEL:   CBC Recent Labs  Lab 01/05/18 0003  WBC 10.0  HGB 14.1  HCT 40.9  PLT 257   ------------------------------------------------------------------------------------------------------------------  Chemistries  Recent Labs  Lab 01/05/18 0003  NA 138  K 4.2  CL 103  CO2 28  GLUCOSE 139*  BUN 22*  CREATININE 0.85  CALCIUM 9.1  AST 16  ALT 16  ALKPHOS 54  BILITOT 0.2*   ------------------------------------------------------------------------------------------------------------------  Cardiac Enzymes Recent Labs  Lab 01/05/18 0003  TROPONINI <0.03   ------------------------------------------------------------------------------------------------------------------  RADIOLOGY:  Dg Chest 2  View  Result Date: 01/04/2018 CLINICAL DATA:  c/o CP few days. Was seen by PCP yesterday and told to follow up with echocardiogram next Friday but pt states she can not wait due to pain being uncomfortable EXAM: CHEST - 2 VIEW COMPARISON:  None. FINDINGS: The heart size and mediastinal contours are within normal limits. Both lungs are clear. Degenerative changes are seen in thoracic spine. IMPRESSION: No evidence for acute cardiopulmonary abnormality. Electronically Signed   By: Norva PavlovElizabeth  Brown M.D.   On: 01/04/2018 13:37   Ct Angio Chest Pe W And/or Wo Contrast  Result Date: 01/05/2018 CLINICAL DATA:  Centralized chest pain EXAM: CT ANGIOGRAPHY CHEST WITH CONTRAST TECHNIQUE: Multidetector CT imaging of the chest was performed using the standard protocol during bolus administration of intravenous contrast. Multiplanar CT image reconstructions and MIPs were obtained to evaluate the vascular anatomy. CONTRAST:  75mL ISOVUE-370 IOPAMIDOL (ISOVUE-370) INJECTION 76% COMPARISON:  Chest x-ray 01/04/2018 FINDINGS: Cardiovascular: Satisfactory opacification of the pulmonary arteries to the segmental level. No evidence of pulmonary embolism. Normal heart size. No pericardial effusion. Nonaneurysmal aorta. No dissection seen. Mediastinum/Nodes: No enlarged mediastinal, hilar, or axillary lymph nodes. Thyroid gland, trachea, and esophagus demonstrate no significant findings. Lungs/Pleura: Lungs are clear. No pleural effusion or pneumothorax. Upper Abdomen: No acute abnormality. Musculoskeletal: No chest wall abnormality. No acute or significant osseous findings. Review of the MIP images confirms the above findings. IMPRESSION: Negative for acute pulmonary embolus or aortic dissection. Clear lung fields. Electronically Signed   By: Jasmine PangKim  Fujinaga M.D.   On: 01/05/2018 02:42   Dg Shoulder Left  Result Date: 01/04/2018 CLINICAL DATA:  57 year old female with chest and left shoulder pain. EXAM: LEFT SHOULDER - 2+ VIEW  COMPARISON:  Chest radiographs today reported separately. FINDINGS: Bone mineralization is within normal limits. No glenohumeral joint dislocation. Proximal left humerus intact. Mild to moderate degenerative changes at the [facility] including osteophytosis. The left clavicle and scapula appear intact. Negative visible left ribs and lung parenchyma. IMPRESSION: No acute osseous abnormality identified. 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 Electronically Signed   By: Odessa FlemingH  Hall M.D.   On: 01/04/2018 16:22   IMPRESSION AND PLAN:   A/P: 58F w/ obesity, T2NIDDM, HTN, tobacco use, (+) FHx CAD/MI p/w 5-6d Hx CP, acutely worse x1d. Hyperglycemia (w/ T2NIDDM). -CP: Pt w/ multiple cardiac risk factors p/w CP. Trop-I (-) x2. EKG (-) ST elevations, (+) ST depressions (II, III, aVF). CXR (-) acute abnl. Tele, continuous cardiac monitoring. Morphine, NTG, O2 PRN. [id number]. Check lipids, initiate Statin therapy as appropriate. NPO. Non-ambulating nuclear pharmacological (Lexiscan) stress test. C/w ARB. Not on beta blocker. Ordered for Echo by PCP, will perform during present encounter. -Hyperglycemia/T2NIDDM:  SSI. Hold PO antihyperglycemics. -c/w other home meds/formulary subs. -FEN/GI: NPO for stress test. -DVT PPx: Lovenox. -Code status: Full code. -Disposition: Observation, < 2 midnights   All the records are reviewed and case discussed with ED provider. Management plans discussed with the patient, family and they are in agreement.  CODE STATUS: Full code  TOTAL TIME TAKING CARE OF THIS PATIENT: 60 minutes.    Barbaraann Rondo M.D on 01/05/2018 at 5:07 AM  Between 7am to 6pm - Pager - [phone]  After 6pm go to [email or web address] - Social research officer, government  [facility] [place]  Office  [phone]  CC: Primary care physician; Carlean Jews, NP   Note: This dictation was prepared with Dragon dictation along with smaller phrase technology. Any transcriptional errors that  result from this process are unintentional.

## 2018-01-05 NOTE — ED Notes (Signed)
Nuclear Medicine called and notified that patient had elevated Troponin.

## 2018-01-05 NOTE — Progress Notes (Signed)
Patient requesting nicotine gum instead of patch. Ok to order per Dr. Imogene Burnhen.

## 2018-01-05 NOTE — Progress Notes (Signed)
ANTICOAGULATION CONSULT NOTE - Initial Consult  Pharmacy Consult for heparin Indication: chest pain/ACS  Allergies  Allergen Reactions  . Codeine Itching    Patient Measurements: Height: 5\' 2"  (157.5 cm) Weight: 190 lb (86.2 kg) IBW/kg (Calculated) : 50.1 Heparin Dosing Weight: 69.7 kg  Vital Signs: Temp: 97.5 F (36.4 C) (08/20 2359) Temp Source: Oral (08/20 2359) BP: 139/66 (08/21 0930) Pulse Rate: 79 (08/21 0930)  Labs: Recent Labs    01/04/18 1258 01/04/18 1603 01/05/18 0003 01/05/18 0840  HGB 14.4  --  14.1  --   HCT 41.8  --  40.9  --   PLT 261  --  257  --   CREATININE 0.74  --  0.85  --   TROPONINI <0.03 <0.03 <0.03 0.10*    Estimated Creatinine Clearance: 74.4 mL/min (by C-G formula based on SCr of 0.85 mg/dL).   Medical History: Past Medical History:  Diagnosis Date  . Anxiety   . Asthma   . Hypertension   . Recurrent major depressive disorder, in partial remission (HCC) 09/17/2017   Assessment: 57 year old female admitted with chest pain. Pharmacy consulted for dosing of heparin for chest pain/ACS. Pt received one dose of prophylactic Lovenox 40 mg at approximately 0900  Goal of Therapy:  Heparin level 0.3-0.7 units/ml Monitor platelets by anticoagulation protocol: Yes   Plan:  Will give conservative heparin bolus of 2000 units as patient received dose of Lovenox this morning. Start heparin drip at 800 units/hr. Will check heparin level 6 hours after start of drip. CBC ordered with morning labs.  Pharmacy will continue to monitor.  Pricilla RiffleAbby K Marcy Sookdeo, PharmD Pharmacy Resident  01/05/2018 10:24 AM

## 2018-01-05 NOTE — ED Provider Notes (Signed)
[facility] Emergency Department Provider Note    First MD Initiated Contact with Patient 01/05/18 [phone]     (approximate)  I have reviewed the triage vital signs and the nursing notes.   HISTORY  Chief Complaint Chest Pain    HPI Sherry Torres is a 57 y.o. female with below list of chronic medical conditions returns to the emergency department with continued chest pain.  Patient states that she has had central chest pain radiating to her left arm and back x3 days.  Patient denies any dyspnea no diaphoresis.  Patient denies any palpitations. Past Medical History:  Diagnosis Date  . Anxiety   . Asthma   . Hypertension   . Recurrent major depressive disorder, in partial remission (HCC) 09/17/2017    Patient Active Problem List   Diagnosis Date Noted  . Chest pain 01/05/2018  . Muscle spasm of back 09/17/2017  . Recurrent major depressive disorder, in partial remission (HCC) 09/17/2017  . Screening for breast cancer 09/17/2017  . Type 2 diabetes mellitus without complication, without long-term current use of insulin (HCC) 06/23/2017  . Essential hypertension 06/23/2017    History reviewed. No pertinent surgical history.  Prior to Admission medications   Medication Sig Start Date End Date Taking? Authorizing Provider  buPROPion (ZYBAN) 150 MG 12 hr tablet Take 1 tablet (150 mg total) by mouth 2 (two) times daily. 09/17/17  Yes Boscia, Kathlynn GrateHeather E, NP  cyclobenzaprine (FLEXERIL) 5 MG tablet Take 1 tablet (5 mg total) by mouth 3 (three) times daily as needed for muscle spasms. 09/17/17  Yes Boscia, Heather E, NP  hydrochlorothiazide (HYDRODIURIL) 25 MG tablet Take 1 tablet (25 mg total) by mouth daily. 07/23/17  Yes Boscia, Kathlynn GrateHeather E, NP  meloxicam (MOBIC) 7.5 MG tablet Take 1 tablet (7.5 mg total) by mouth 2 (two) times daily as needed for pain. 12/07/17  Yes Carlean JewsBoscia, Heather E, NP  metFORMIN (GLUCOPHAGE) 500 MG tablet Take 0.5 tablets (250 mg total) by mouth  2 (two) times daily with a meal. 12/08/17  Yes Boscia, Heather E, NP  olmesartan (BENICAR) 5 MG tablet Take 1 tablet (5 mg total) by mouth daily. 12/07/17  Yes Boscia, Heather E, NP  VENTOLIN HFA 108 (90 Base) MCG/ACT inhaler Inhale 1-2 puffs into the lungs daily. 11/09/17  Yes Boscia, Heather E, NP  diclofenac sodium (VOLTAREN) 1 % GEL Apply 4 g topically 4 (four) times daily. 01/03/18   Carlean JewsBoscia, Heather E, NP    Allergies Codeine  Family History  Problem Relation Age of Onset  . Cancer Mother   . Breast cancer Mother [id number]  . Hyperlipidemia Father     Social History Social History   Tobacco Use  . Smoking status: Current Every Day Smoker    Types: Cigarettes  . Smokeless tobacco: Never Used  Substance Use Topics  . Alcohol use: No    Frequency: Never  . Drug use: No    Review of Systems Constitutional: No fever/chills Eyes: No visual changes. ENT: No sore throat. Cardiovascular: Positive for chest pain. Respiratory: Denies shortness of breath. Gastrointestinal: No abdominal pain.  No nausea, no vomiting.  No diarrhea.  No constipation. Genitourinary: Negative for dysuria. Musculoskeletal: Negative for neck pain.  Negative for back pain. Integumentary: Negative for rash. Neurological: Negative for headaches, focal weakness or numbness.   ____________________________________________   PHYSICAL EXAM:  VITAL SIGNS: ED Triage Vitals [01/04/18 2359]  Enc Vitals Group     BP (!) 191/86  Pulse Rate 83     Resp 20     Temp (!) 97.5 F (36.4 C)     Temp Source Oral     SpO2 100 %     Weight 86.2 kg (190 lb)     Height 1.575 m (5\' 2" )     Head Circumference      Peak Flow      Pain Score 8     Pain Loc      Pain Edu?      Excl. in GC?     Constitutional: Alert and oriented.  Apparent discomfort  eyes: Conjunctivae are normal. Head: Atraumatic. Mouth/Throat: Mucous membranes are moist.Oropharynx non-erythematous. Neck: No stridor.  Cardiovascular: Normal rate,  regular rhythm. Good peripheral circulation. Grossly normal heart sounds. Respiratory: Normal respiratory effort.  No retractions. Lungs CTAB. Gastrointestinal: Soft and nontender. No distention.  Musculoskeletal: No lower extremity tenderness nor edema. No gross deformities of extremities. Neurologic:  Normal speech and language. No gross focal neurologic deficits are appreciated.  Skin:  Skin is warm, dry and intact. No rash noted. Psychiatric: Mood and affect are normal. Speech and behavior are normal.  ____________________________________________   LABS (all labs ordered are listed, but only abnormal results are displayed)  Labs Reviewed  CBC - Abnormal; Notable for the following components:      Result Value   RDW 14.7 (*)    All other components within normal limits  COMPREHENSIVE METABOLIC PANEL - Abnormal; Notable for the following components:   Glucose, Bld 139 (*)    BUN 22 (*)    Total Bilirubin 0.2 (*)    All other components within normal limits  TROPONIN I  LIPID PANEL   ____________________________________________  EKG  ED ECG REPORT I, Cobb N Samul Mcinroy, the attending physician, personally viewed and interpreted this ECG.   Date: 01/06/2018  EKG Time: 11:58 PM  Rate: 84  Rhythm: Normal sinus rhythm  Axis: Normal  Intervals:  ST&T Change: Inferior lateral ST segment depression  ____________________________________________  RADIOLOGY I, Elsberry Dewayne ShorterN Taetum Flewellen, personally viewed and evaluated these images (plain radiographs) as part of my medical decision making, as well as reviewing the written report by the radiologist.  ED MD interpretation: No evidence of acute cardiopulmonary abnormality per the radiologist.  Official radiology report(s): Dg Chest 2 View  Result Date: 01/04/2018 CLINICAL DATA:  c/o CP few days. Was seen by PCP yesterday and told to follow up with echocardiogram next Friday but pt states she can not wait due to pain being uncomfortable  EXAM: CHEST - 2 VIEW COMPARISON:  None. FINDINGS: The heart size and mediastinal contours are within normal limits. Both lungs are clear. Degenerative changes are seen in thoracic spine. IMPRESSION: No evidence for acute cardiopulmonary abnormality. Electronically Signed   By: Norva PavlovElizabeth  Wilder Kurowski M.D.   On: 01/04/2018 13:37   Ct Angio Chest Pe W And/or Wo Contrast  Result Date: 01/05/2018 CLINICAL DATA:  Centralized chest pain EXAM: CT ANGIOGRAPHY CHEST WITH CONTRAST TECHNIQUE: Multidetector CT imaging of the chest was performed using the standard protocol during bolus administration of intravenous contrast. Multiplanar CT image reconstructions and MIPs were obtained to evaluate the vascular anatomy. CONTRAST:  75mL ISOVUE-370 IOPAMIDOL (ISOVUE-370) INJECTION 76% COMPARISON:  Chest x-ray 01/04/2018 FINDINGS: Cardiovascular: Satisfactory opacification of the pulmonary arteries to the segmental level. No evidence of pulmonary embolism. Normal heart size. No pericardial effusion. Nonaneurysmal aorta. No dissection seen. Mediastinum/Nodes: No enlarged mediastinal, hilar, or axillary lymph nodes. Thyroid gland, trachea, and esophagus  demonstrate no significant findings. Lungs/Pleura: Lungs are clear. No pleural effusion or pneumothorax. Upper Abdomen: No acute abnormality. Musculoskeletal: No chest wall abnormality. No acute or significant osseous findings. Review of the MIP images confirms the above findings. IMPRESSION: Negative for acute pulmonary embolus or aortic dissection. Clear lung fields. Electronically Signed   By: Jasmine Pang M.D.   On: 01/05/2018 02:42   Dg Shoulder Left  Result Date: 01/04/2018 CLINICAL DATA:  57 year old female with chest and left shoulder pain. EXAM: LEFT SHOULDER - 2+ VIEW COMPARISON:  Chest radiographs today reported separately. FINDINGS: Bone mineralization is within normal limits. No glenohumeral joint dislocation. Proximal left humerus intact. Mild to moderate degenerative  changes at the left [facility] joint including osteophytosis. The left clavicle and scapula appear intact. Negative visible left ribs and lung parenchyma. IMPRESSION: No acute osseous abnormality identified. 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 Electronically Signed   By: Odessa Fleming M.D.   On: 01/04/2018 16:22    Procedures   ____________________________________________   INITIAL IMPRESSION / ASSESSMENT AND PLAN / ED COURSE  As part of my medical decision making, I reviewed the following data within the electronic MEDICAL RECORD NUMBER   57 year old female presenting with above-stated history and physical exam of ongoing chest pain.  EKG revealed ST segment depression inferior laterally.  Troponin negative x3 at this point.  Patient given IV morphine however pain is persisted.  EKG concerning secondary to ST segment depression inferior laterally.  Given this finding with ongoing chest pain will admit the patient to the hospitalist service.  Patient discussed with Dr. Bosie Helper for hospital admission for further evaluation and management. ____________________________________________  FINAL CLINICAL IMPRESSION(S) / ED DIAGNOSES  Final diagnoses:  Chest pain, unspecified type     MEDICATIONS GIVEN DURING THIS VISIT:  Medications  sodium chloride 0.9 % bolus 1,000 mL (has no administration in time range)  morphine 2 MG/ML injection 2-4 mg (has no administration in time range)  nitroGLYCERIN (NITROSTAT) SL tablet 0.4 mg (has no administration in time range)  aspirin chewable tablet 81 mg (has no administration in time range)  morphine 2 MG/ML injection 2 mg (2 mg Intravenous Given 01/05/18 0204)  ondansetron (ZOFRAN) injection 4 mg (4 mg Intravenous Given 01/05/18 0204)  iopamidol (ISOVUE-370) 76 % injection 75 mL (75 mLs Intravenous Contrast Given 01/05/18 0216)  morphine 2 MG/ML injection 2 mg (2 mg Intravenous Given 01/05/18 0250)     ED Discharge Orders    None       Note:  This document  was prepared using Dragon voice recognition software and may include unintentional dictation errors.    Darci Current, MD 01/06/18 0130

## 2018-01-06 ENCOUNTER — Encounter
Admission: EM | Disposition: A | Discharge status: Home / Self Care | Payer: Self-pay | Source: Home / Self Care | Attending: Internal Medicine

## 2018-01-06 ENCOUNTER — Encounter: Payer: Self-pay | Admitting: *Deleted

## 2018-01-06 DIAGNOSIS — Z885 Allergy status to narcotic agent status: Secondary | ICD-10-CM | POA: Diagnosis not present

## 2018-01-06 DIAGNOSIS — E669 Obesity, unspecified: Secondary | ICD-10-CM | POA: Diagnosis present

## 2018-01-06 DIAGNOSIS — Z7984 Long term (current) use of oral hypoglycemic drugs: Secondary | ICD-10-CM | POA: Diagnosis not present

## 2018-01-06 DIAGNOSIS — I214 Non-ST elevation (NSTEMI) myocardial infarction: Secondary | ICD-10-CM | POA: Diagnosis present

## 2018-01-06 DIAGNOSIS — I25110 Atherosclerotic heart disease of native coronary artery with unstable angina pectoris: Secondary | ICD-10-CM | POA: Diagnosis present

## 2018-01-06 DIAGNOSIS — I2510 Atherosclerotic heart disease of native coronary artery without angina pectoris: Secondary | ICD-10-CM | POA: Diagnosis not present

## 2018-01-06 DIAGNOSIS — Z791 Long term (current) use of non-steroidal anti-inflammatories (NSAID): Secondary | ICD-10-CM | POA: Diagnosis not present

## 2018-01-06 DIAGNOSIS — E1165 Type 2 diabetes mellitus with hyperglycemia: Secondary | ICD-10-CM | POA: Diagnosis present

## 2018-01-06 DIAGNOSIS — F17210 Nicotine dependence, cigarettes, uncomplicated: Secondary | ICD-10-CM | POA: Diagnosis present

## 2018-01-06 DIAGNOSIS — I10 Essential (primary) hypertension: Secondary | ICD-10-CM | POA: Diagnosis present

## 2018-01-06 DIAGNOSIS — R079 Chest pain, unspecified: Secondary | ICD-10-CM | POA: Diagnosis present

## 2018-01-06 DIAGNOSIS — E785 Hyperlipidemia, unspecified: Secondary | ICD-10-CM | POA: Diagnosis present

## 2018-01-06 DIAGNOSIS — Z6835 Body mass index (BMI) 35.0-35.9, adult: Secondary | ICD-10-CM | POA: Diagnosis not present

## 2018-01-06 DIAGNOSIS — Z79899 Other long term (current) drug therapy: Secondary | ICD-10-CM | POA: Diagnosis not present

## 2018-01-06 DIAGNOSIS — F329 Major depressive disorder, single episode, unspecified: Secondary | ICD-10-CM | POA: Diagnosis present

## 2018-01-06 DIAGNOSIS — J45909 Unspecified asthma, uncomplicated: Secondary | ICD-10-CM | POA: Diagnosis present

## 2018-01-06 HISTORY — PX: LEFT HEART CATH AND CORONARY ANGIOGRAPHY: CATH118249

## 2018-01-06 LAB — LIPID PANEL
Cholesterol: 164 mg/dL (ref 0–200)
HDL: 33 mg/dL — ABNORMAL LOW (ref >40)
LDL CALC: 95 mg/dL (ref 0–99)
TRIGLYCERIDES: 182 mg/dL — AB (ref <150)
Total CHOL/HDL Ratio: 5 RATIO
VLDL: 36 mg/dL (ref 0–40)

## 2018-01-06 LAB — BASIC METABOLIC PANEL
ANION GAP: 7 (ref 5–15)
BUN: 15 mg/dL (ref 6–20)
CALCIUM: 8.6 mg/dL — AB (ref 8.9–10.3)
CO2: 26 mmol/L (ref 22–32)
Chloride: 107 mmol/L (ref 98–111)
Creatinine, Ser: 0.71 mg/dL (ref 0.44–1.00)
GFR calc Af Amer: >60 mL/min (ref >60)
Glucose, Bld: 153 mg/dL — ABNORMAL HIGH (ref 70–99)
Potassium: 3.9 mmol/L (ref 3.5–5.1)
SODIUM: 140 mmol/L (ref 135–145)

## 2018-01-06 LAB — CBC
HEMATOCRIT: 37 % (ref 35.0–47.0)
HEMOGLOBIN: 12.6 g/dL (ref 12.0–16.0)
MCH: 30.4 pg (ref 26.0–34.0)
MCHC: 34.1 g/dL (ref 32.0–36.0)
MCV: 88.9 fL (ref 80.0–100.0)
Platelets: 238 10*3/uL (ref 150–440)
RBC: 4.16 MIL/uL (ref 3.80–5.20)
RDW: 14.3 % (ref 11.5–14.5)
WBC: 8.7 10*3/uL (ref 3.6–11.0)

## 2018-01-06 LAB — HIV ANTIBODY (ROUTINE TESTING W REFLEX): HIV SCREEN 4TH GENERATION: NONREACTIVE

## 2018-01-06 LAB — ECHOCARDIOGRAM COMPLETE
HEIGHTINCHES: 62 in
Weight: 3104 oz

## 2018-01-06 LAB — GLUCOSE, CAPILLARY
GLUCOSE-CAPILLARY: 120 mg/dL — AB (ref 70–99)
GLUCOSE-CAPILLARY: 108 mg/dL — AB (ref 70–99)
GLUCOSE-CAPILLARY: 90 mg/dL (ref 70–99)
Glucose-Capillary: 94 mg/dL (ref 70–99)

## 2018-01-06 LAB — HEPARIN LEVEL (UNFRACTIONATED)
HEPARIN UNFRACTIONATED: 0.25 [IU]/mL — AB (ref 0.30–0.70)
HEPARIN UNFRACTIONATED: 0.17 [IU]/mL — AB (ref 0.30–0.70)

## 2018-01-06 SURGERY — LEFT HEART CATH AND CORONARY ANGIOGRAPHY
Anesthesia: Moderate Sedation

## 2018-01-06 MED ORDER — HEPARIN BOLUS VIA INFUSION
1000.0000 [IU] | Freq: Once | INTRAVENOUS | Status: DC
  Filled 2018-01-06: qty 1000

## 2018-01-06 MED ORDER — SODIUM CHLORIDE 0.9% FLUSH: 3.0000 mL | INTRAVENOUS | Status: DC | PRN

## 2018-01-06 MED ORDER — VERAPAMIL HCL 2.5 MG/ML IV SOLN
INTRAVENOUS | Status: DC | PRN
  Administered 2018-01-06: 2.5 mg via INTRA_ARTERIAL

## 2018-01-06 MED ORDER — VERAPAMIL HCL 2.5 MG/ML IV SOLN
INTRAVENOUS | Status: AC
  Filled 2018-01-06: qty 2

## 2018-01-06 MED ORDER — HEPARIN SODIUM (PORCINE) 1000 UNIT/ML IJ SOLN
INTRAMUSCULAR | Status: AC
  Filled 2018-01-06: qty 1

## 2018-01-06 MED ORDER — CLOPIDOGREL BISULFATE 75 MG PO TABS
ORAL_TABLET | ORAL | Status: AC
  Administered 2018-01-06: 300 mg via ORAL
  Filled 2018-01-06: qty 4

## 2018-01-06 MED ORDER — ASPIRIN 81 MG PO CHEW
CHEWABLE_TABLET | ORAL | Status: AC
  Filled 2018-01-06: qty 1

## 2018-01-06 MED ORDER — BISOPROLOL FUMARATE 5 MG PO TABS
5.0000 mg | ORAL_TABLET | Freq: Every day | ORAL | Status: DC
  Administered 2018-01-06 – 2018-01-07 (×2): 5 mg via ORAL
  Filled 2018-01-06 (×2): qty 1

## 2018-01-06 MED ORDER — CLOPIDOGREL BISULFATE 75 MG PO TABS
300.0000 mg | ORAL_TABLET | Freq: Once | ORAL | Status: AC
  Administered 2018-01-06: 300 mg via ORAL

## 2018-01-06 MED ORDER — HEPARIN (PORCINE) IN NACL 1000-0.9 UT/500ML-% IV SOLN
INTRAVENOUS | Status: AC
  Filled 2018-01-06: qty 1000

## 2018-01-06 MED ORDER — SODIUM CHLORIDE 0.9 % WEIGHT BASED INFUSION
1.0000 mL/kg/h | INTRAVENOUS | Status: DC
  Administered 2018-01-06: 1 mL/kg/h via INTRAVENOUS

## 2018-01-06 MED ORDER — FENTANYL CITRATE (PF) 100 MCG/2ML IJ SOLN
INTRAMUSCULAR | Status: DC | PRN
  Administered 2018-01-06 (×2): 25 ug via INTRAVENOUS

## 2018-01-06 MED ORDER — SODIUM CHLORIDE 0.9 % IV SOLN: INTRAVENOUS | Status: AC

## 2018-01-06 MED ORDER — ISOSORBIDE MONONITRATE ER 30 MG PO TB24
30.0000 mg | ORAL_TABLET | Freq: Every day | ORAL | Status: DC
  Administered 2018-01-06 – 2018-01-07 (×2): 30 mg via ORAL
  Filled 2018-01-06 (×4): qty 1

## 2018-01-06 MED ORDER — MIDAZOLAM HCL 2 MG/2ML IJ SOLN
INTRAMUSCULAR | Status: AC
  Filled 2018-01-06: qty 2

## 2018-01-06 MED ORDER — MIDAZOLAM HCL 2 MG/2ML IJ SOLN
INTRAMUSCULAR | Status: DC | PRN
  Administered 2018-01-06: 1 mg via INTRAVENOUS
  Administered 2018-01-06: 2 mg via INTRAVENOUS

## 2018-01-06 MED ORDER — FENTANYL CITRATE (PF) 100 MCG/2ML IJ SOLN
INTRAMUSCULAR | Status: AC
  Filled 2018-01-06: qty 2

## 2018-01-06 MED ORDER — MORPHINE SULFATE (PF) 2 MG/ML IV SOLN
INTRAVENOUS | Status: AC
  Filled 2018-01-06: qty 1

## 2018-01-06 MED ORDER — HEPARIN BOLUS VIA INFUSION
2100.0000 [IU] | Freq: Once | INTRAVENOUS | Status: AC
  Administered 2018-01-06: 2100 [IU] via INTRAVENOUS
  Filled 2018-01-06: qty 2100

## 2018-01-06 MED ORDER — SODIUM CHLORIDE 0.9 % IV SOLN: 250.0000 mL | INTRAVENOUS | Status: DC | PRN

## 2018-01-06 MED ORDER — ENOXAPARIN SODIUM 40 MG/0.4ML ~~LOC~~ SOLN
40.0000 mg | SUBCUTANEOUS | Status: DC
  Filled 2018-01-06: qty 0.4

## 2018-01-06 MED ORDER — SODIUM CHLORIDE 0.9% FLUSH
3.0000 mL | Freq: Two times a day (BID) | INTRAVENOUS | Status: DC
  Administered 2018-01-06: 3 mL via INTRAVENOUS

## 2018-01-06 MED ORDER — SODIUM CHLORIDE 0.9% FLUSH: 3.0000 mL | Freq: Two times a day (BID) | INTRAVENOUS | Status: DC

## 2018-01-06 MED ORDER — ATORVASTATIN CALCIUM 20 MG PO TABS
80.0000 mg | ORAL_TABLET | Freq: Every day | ORAL | Status: DC
  Administered 2018-01-06: 80 mg via ORAL
  Filled 2018-01-06: qty 4

## 2018-01-06 MED ORDER — HEPARIN SODIUM (PORCINE) 1000 UNIT/ML IJ SOLN
INTRAMUSCULAR | Status: DC | PRN
  Administered 2018-01-06: 4000 [IU] via INTRAVENOUS

## 2018-01-06 MED ORDER — CLOPIDOGREL BISULFATE 75 MG PO TABS
75.0000 mg | ORAL_TABLET | Freq: Every day | ORAL | Status: DC
  Administered 2018-01-07: 75 mg via ORAL
  Filled 2018-01-06: qty 1

## 2018-01-06 SURGICAL SUPPLY — 11 items
CATH INFINITI 5 FR JL3.5 (CATHETERS) ×2 IMPLANT
CATH INFINITI 5FR JK (CATHETERS) ×2 IMPLANT
CATH INFINITI JR4 5F (CATHETERS) ×2 IMPLANT
DEVICE RAD TR BAND REGULAR (VASCULAR PRODUCTS) ×2 IMPLANT
KIT MANI 3VAL PERCEP (MISCELLANEOUS) ×2 IMPLANT
NEEDLE PERC 21GX4CM (NEEDLE) ×2 IMPLANT
PACK CARDIAC CATH (CUSTOM PROCEDURE TRAY) ×2 IMPLANT
SHEATH RAIN RADIAL 21G 6FR (SHEATH) ×2 IMPLANT
WIRE HITORQ VERSACORE ST 145CM (WIRE) ×2 IMPLANT
WIRE INTUITION PROPEL ST 180CM (WIRE) ×2 IMPLANT
WIRE ROSEN-J .035X260CM (WIRE) ×2 IMPLANT

## 2018-01-06 NOTE — Progress Notes (Signed)
Pt complaining of shoulder pain  Ache 6/10 radiating to chest and back [facility]5. Dr. Kirke CorinArida at bedside aware. Pt repositioned at that time for comfort. At 1300 Pt complains of worsening pain in shoulder 7/10 and request to sit on side of bed as this has helped before. Pt assisted to side of bed. Dr. Kirke CorinArida paged to update, MD unavailable at this time. NP Marvis Represshris Berg paged and call back received. Updated on Pt status, VS and meds given, Ok per NP [facility]. At 1335 Pt states her pain has resolved after sitting up and declines pain meds at this time. Will continue to monitor closely.

## 2018-01-06 NOTE — Progress Notes (Signed)
[employer] - [facility]   PATIENT NAME: Sherry Torres    MR#:  [id number]  DATE OF BIRTH:  10/30/1960  SUBJECTIVE:  CHIEF COMPLAINT:   Chief Complaint  Patient presents with  . Chest Pain   Chest pain and shoulder pain this morning. REVIEW OF SYSTEMS:  Review of Systems  Constitutional: Negative for chills, fever and malaise/fatigue.  HENT: Negative for sore throat.   Eyes: Negative for blurred vision and double vision.  Respiratory: Negative for cough, hemoptysis, shortness of breath, wheezing and stridor.   Cardiovascular: Positive for chest pain. Negative for palpitations, orthopnea and leg swelling.  Gastrointestinal: Negative for abdominal pain, blood in stool, diarrhea, melena, nausea and vomiting.  Genitourinary: Negative for dysuria, flank pain and hematuria.  Musculoskeletal: Negative for back pain and joint pain.  Skin: Negative for rash.  Neurological: Negative for dizziness, sensory change, focal weakness, seizures, loss of consciousness, weakness and headaches.  Endo/Heme/Allergies: Negative for polydipsia.  Psychiatric/Behavioral: Negative for depression. The patient is not nervous/anxious.     DRUG ALLERGIES:   Allergies  Allergen Reactions  . Codeine Itching   VITALS:  Blood pressure (!) 145/69, pulse 75, temperature 97.9 F (36.6 C), temperature source Oral, resp. rate 19, height 5\' 2"  (1.575 m), weight 88.1 kg, SpO2 97 %. PHYSICAL EXAMINATION:  Physical Exam  Constitutional: She is oriented to person, place, and time.  Obesity.  HENT:  Head: Normocephalic.  Mouth/Throat: Oropharynx is clear and moist.  Eyes: Pupils are equal, round, and reactive to light. Conjunctivae and EOM are normal. No scleral icterus.  Neck: Normal range of motion. Neck supple. No JVD present. No tracheal deviation present.  Cardiovascular: Normal rate, regular rhythm and normal heart sounds. Exam reveals no gallop.  No murmur  heard. Pulmonary/Chest: Effort normal and breath sounds normal. No respiratory distress. She has no wheezes. She has no rales.  Abdominal: Soft. Bowel sounds are normal. She exhibits no distension. There is no tenderness. There is no rebound.  Musculoskeletal: Normal range of motion. She exhibits no edema or tenderness.  Neurological: She is alert and oriented to person, place, and time. No cranial nerve deficit.  Skin: No rash noted. No erythema.  Psychiatric: She has a normal mood and affect.   LABORATORY PANEL:  Female CBC Recent Labs  Lab 01/06/18 0013  WBC 8.7  HGB 12.6  HCT 37.0  PLT 238   ------------------------------------------------------------------------------------------------------------------ Chemistries  Recent Labs  Lab 01/05/18 0003 01/06/18 0013  NA 138 140  K 4.2 3.9  CL 103 107  CO2 28 26  GLUCOSE 139* 153*  BUN 22* 15  CREATININE 0.85 0.71  CALCIUM 9.1 8.6*  AST 16  --   ALT 16  --   ALKPHOS 54  --   BILITOT 0.2*  --    RADIOLOGY:  No results found. ASSESSMENT AND PLAN:   NSTEMI, Troponin is elevated up to 0.1 then 0.19, even on heparin drip. She was given aspirin and Lipitor. Abnormal stress test. S/P cardiac cath, which show one-vessel CAD. Medical treatment, continue aspirin and Lipitor, added Plavix and Imdur per Dr. Kirke CorinArida.  Hyperlipidemia and obesity.  LDL 113.  Started Lipitor.  Advised diet control and exercise. Diabetes sliding scale. Hypertension.  Continue hypertension medication. Tobacco abuse.  Smoking cessation was counseled for 3 to 4 minutes.  Patient wants to quit.  All the records are reviewed and case discussed with Care Management/Social Worker. Management plans discussed with the patient, family and they are  in agreement.  CODE STATUS: Full Code  TOTAL TIME TAKING CARE OF THIS PATIENT: 32 minutes.   More than 50% of the time was spent in counseling/coordination of care: YES  POSSIBLE D/C IN 1 DAYS, DEPENDING ON  CLINICAL CONDITION.   Shaune Pollack M.D on 01/06/2018 at 2:09 PM  Between 7am to 6pm - Pager - [phone]  After 6pm go to [email or web address] - Therapist, nutritional Hospitalists

## 2018-01-06 NOTE — Progress Notes (Signed)
ANTICOAGULATION CONSULT NOTE - Follow Up Consult  Pharmacy Consult for heparin Indication: chest pain/ACS  Allergies  Allergen Reactions  . Codeine Itching    Patient Measurements: Height: 5\' 2"  (157.5 cm) Weight: 194 lb (88 kg) IBW/kg (Calculated) : 50.1 Heparin Dosing Weight: 70 kg  Vital Signs: Temp: 97.9 F (36.6 C) (08/22 0738) Temp Source: Oral (08/22 0738) BP: 131/75 (08/22 0738) Pulse Rate: 73 (08/22 0738)  Labs: Recent Labs    01/04/18 1258  01/05/18 0003 01/05/18 0840 01/05/18 1159 01/05/18 1805 01/06/18 0013 01/06/18 0754  HGB 14.4  --  14.1  --   --   --  12.6  --   HCT 41.8  --  40.9  --   --   --  37.0  --   PLT 261  --  257  --   --   --  238  --   APTT  --   --   --   --  34  --   --   --   LABPROT  --   --   --   --  12.8  --   --   --   INR  --   --   --   --  0.97  --   --   --   HEPARINUNFRC  --   --   --   --   --  0.32 0.17* 0.25*  CREATININE 0.74  --  0.85  --   --   --  0.71  --   TROPONINI <0.03   < > <0.03 0.10* 0.13* 0.19*  --   --    < > = values in this interval not displayed.    Estimated Creatinine Clearance: 80 mL/min (by C-G formula based on SCr of 0.71 mg/dL).  Assessment: 57 year old female admitted with chest pain. Pharmacy consulted for dosing of heparin for chest pain/ACS  8/22 0000 HL 0.17 >> 2100 unit bolus and rate increased to 1050 units/hr  Goal of Therapy:  Heparin level 0.3-0.7 units/ml Monitor platelets by anticoagulation protocol: Yes   Plan:  HL 0.25. Will increase rate to 1200 units/hr. Holding off on bolus at this time as patient is scheduled for cath this morning at 1030. Will order HL for 8/22 @ 1530 and f/u recommendations from Cardiology regarding heparin drip post-cath. CBC ordered with morning labs.  Pharmacy to continue to follow.  Pricilla RiffleAbby K Diamon Reddinger, PharmD Pharmacy Resident  01/06/2018 9:41 AM

## 2018-01-06 NOTE — Progress Notes (Signed)
Progress Note  Patient Name: Sherry Torres Date of Encounter: 01/06/2018  Primary Cardiologist: Yvonne Kendall, MD  Subjective   Mild, 1/10, intermittent chest discomfort overnight.  Comes and goes.  Lasts just a few mins @ a time.  No dyspnea.  Nervous about cath this AM. Many questions - all answered.  Inpatient Medications    Scheduled Meds: . aspirin  81 mg Oral Daily  . atorvastatin  40 mg Oral q1800  . buPROPion  150 mg Oral BID  . heparin  1,000 Units Intravenous Once  . insulin aspart  0-15 Units Subcutaneous TID WC  . insulin aspart  0-5 Units Subcutaneous QHS  . irbesartan  37.5 mg Oral Daily  . sodium chloride flush  3 mL Intravenous Q12H   Continuous Infusions: . sodium chloride    . sodium chloride    . heparin 1,050 Units/hr (01/06/18 0134)   PRN Meds: sodium chloride, acetaminophen, albuterol, bisacodyl, cyclobenzaprine, morphine injection, nicotine polacrilex, nitroGLYCERIN, ondansetron (ZOFRAN) IV, senna-docusate, sodium chloride flush   Vital Signs    Vitals:   01/05/18 1509 01/05/18 2005 01/06/18 0517 01/06/18 0738  BP: 130/66 (!) 146/65 136/72 131/75  Pulse: 84 86 82 73  Resp:      Temp: 98.3 F (36.8 C) 97.9 F (36.6 C) 98.4 F (36.9 C) 97.9 F (36.6 C)  TempSrc: Oral Oral Oral Oral  SpO2: 97% 95% 98% 98%  Weight: 88 kg     Height: 5\' 2"  (1.575 m)       Intake/Output Summary (Last 24 hours) at 01/06/2018 0903 Last data filed at 01/06/2018 0600 Gross per 24 hour  Intake 173.11 ml  Output 600 ml  Net -426.89 ml   Filed Weights   01/04/18 2359 01/05/18 1509  Weight: 86.2 kg 88 kg    Physical Exam   GEN: Well nourished, well developed, in no acute distress.  HEENT: Grossly normal.  Neck: Supple, no JVD, carotid bruits, or masses. Cardiac: RRR, no murmurs, rubs, or gallops. No clubbing, cyanosis, edema.  Radials/DP/PT 2+ and equal bilaterally.  Respiratory:  Respirations regular and unlabored, clear to auscultation  bilaterally. GI: Soft, nontender, nondistended, BS + x 4. MS: no deformity or atrophy. Skin: warm and dry, no rash. Neuro:  Strength and sensation are intact. Psych: AAOx3.  Normal affect.  Labs    Chemistry Recent Labs  Lab 01/04/18 1258 01/05/18 0003 01/06/18 0013  NA 138 138 140  K 3.9 4.2 3.9  CL 103 103 107  CO2 26 28 26   GLUCOSE 85 139* 153*  BUN 20 22* 15  CREATININE 0.74 0.85 0.71  CALCIUM 9.2 9.1 8.6*  PROT  --  7.6  --   ALBUMIN  --  4.0  --   AST  --  16  --   ALT  --  16  --   ALKPHOS  --  54  --   BILITOT  --  0.2*  --   GFRNONAA >60 >60 >60  GFRAA >60 >60 >60  ANIONGAP 9 7 7      Hematology Recent Labs  Lab 01/04/18 1258 01/05/18 0003 01/06/18 0013  WBC 7.9 10.0 8.7  RBC 4.72 4.61 4.16  HGB 14.4 14.1 12.6  HCT 41.8 40.9 37.0  MCV 88.7 88.7 88.9  MCH 30.4 30.7 30.4  MCHC 34.3 34.6 34.1  RDW 14.2 14.7* 14.3  PLT 261 257 238    Cardiac Enzymes Recent Labs  Lab 01/05/18 0003 01/05/18 0840 01/05/18 1159 01/05/18 1805  TROPONINI <0.03 0.10* 0.13* 0.19*      Radiology    Dg Chest 2 View  Result Date: 01/04/2018 CLINICAL DATA:  c/o CP few days. Was seen by PCP yesterday and told to follow up with echocardiogram next Friday but pt states she can not wait due to pain being uncomfortable EXAM: CHEST - 2 VIEW COMPARISON:  None. FINDINGS: The heart size and mediastinal contours are within normal limits. Both lungs are clear. Degenerative changes are seen in thoracic spine. IMPRESSION: No evidence for acute cardiopulmonary abnormality. Electronically Signed   By: Norva Pavlov M.D.   On: 01/04/2018 13:37   Ct Angio Chest Pe W And/or Wo Contrast  Result Date: 01/05/2018 CLINICAL DATA:  Centralized chest pain EXAM: CT ANGIOGRAPHY CHEST WITH CONTRAST TECHNIQUE: Multidetector CT imaging of the chest was performed using the standard protocol during bolus administration of intravenous contrast. Multiplanar CT image reconstructions and MIPs were  obtained to evaluate the vascular anatomy. CONTRAST:  75mL ISOVUE-370 IOPAMIDOL (ISOVUE-370) INJECTION 76% COMPARISON:  Chest x-ray 01/04/2018 FINDINGS: Cardiovascular: Satisfactory opacification of the pulmonary arteries to the segmental level. No evidence of pulmonary embolism. Normal heart size. No pericardial effusion. Nonaneurysmal aorta. No dissection seen. Mediastinum/Nodes: No enlarged mediastinal, hilar, or axillary lymph nodes. Thyroid gland, trachea, and esophagus demonstrate no significant findings. Lungs/Pleura: Lungs are clear. No pleural effusion or pneumothorax. Upper Abdomen: No acute abnormality. Musculoskeletal: No chest wall abnormality. No acute or significant osseous findings. Review of the MIP images confirms the above findings. IMPRESSION: Negative for acute pulmonary embolus or aortic dissection. Clear lung fields. Electronically Signed   By: Jasmine Pang M.D.   On: 01/05/2018 02:42   Nm Myocar Multi W/spect W/wall Motion / Ef  Result Date: 01/05/2018  Abnormal, probably low risk myocardial perfusion stress test.  There is a small in size, mild in severity, reversible defect involving the basal and mid inferolateral segments concerning for ischemia but cannot rule out artifact (attenuation and misregistration).  Left ventricular systolic function appears grossly normal, though EF cannot be calculated due to significant extracardiac activity.    Dg Shoulder Left  Result Date: 01/04/2018 CLINICAL DATA:  57 year old female with chest and left shoulder pain. EXAM: LEFT SHOULDER - 2+ VIEW COMPARISON:  Chest radiographs today reported separately. FINDINGS: Bone mineralization is within normal limits. No glenohumeral joint dislocation. Proximal left humerus intact. Mild to moderate degenerative changes at the left [facility] joint including osteophytosis. The left clavicle and scapula appear intact. Negative visible left ribs and lung parenchyma. IMPRESSION: No acute osseous abnormality  identified. 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 Electronically Signed   By: Odessa Fleming M.D.   On: 01/04/2018 16:22    Telemetry    RSR - Personally Reviewed  Cardiac Studies   Lexiscan Cardiolite 8.21.2019  Abnormal, probably low risk myocardial perfusion stress test. There is a small in size, mild in severity, reversible defect involving the basal and mid inferolateral segments concerning for ischemia but cannot rule out artifact (attenuation and misregistration). Left ventricular systolic function appears grossly normal, though EF cannot be calculated due to significant extracardiac activity. _____________   2D Echocardiogram 8.21.2019  Study Conclusions   - Left ventricle: The cavity size was normal. Wall thickness was   normal. Systolic function was normal. The estimated ejection   fraction was in the range of 55% to 60%. Wall motion was normal;   there were no regional wall motion abnormalities. Doppler   parameters are consistent with abnormal left ventricular  relaxation (grade 1 diastolic dysfunction). _____________   Patient Profile     57 y.o. female w/ a h/o DMII, HTN, obesity, and tob abuse, who was admitted with sscp/NSTEMI with subsequent abnl stress test.  Assessment & Plan    1.  NSTEMI:  Admitted w/ sscp.  Trop peaked @ 0.19 last night (no redraw).  Echo w/ nl EF.  MV w/ basal and mid inflat ischemia.  Intermittent, 1/10 chest discomfort overnight.  Pain free this AM and scheduled for cath.  She is nervous and had multiple questions - all answered and she verbalized understanding.  The patient understands that risks include but are not limited to stroke (1 in 1000), death (1 in 1000), kidney failure [usually temporary] (1 in 500), bleeding (1 in 200), allergic reaction [possibly serious] (1 in 200), and agrees to proceed.  Cont asa, statin, heparin.  No wheezing on exam.  Will add low dose bisoprolol.  2.  Essential HTN:  Trending 130's to 140's.  HCTZ on  hold pending cath.  Cont ARB.  Adding low dose  blocker in setting of above.  3.  HL:  LDL 113.  Cont high potency statin therapy.  4.  DMII:  A1c 6.5.  Metformin on hold.  5.  Tob abuse: cessation advised.  Signed, Nicolasa Duckinghristopher Jerline Linzy, NP  01/06/2018, 9:03 AM    For questions or updates, please contact   Please consult [email or web address] for contact info under Cardiology/STEMI.

## 2018-01-06 NOTE — Progress Notes (Signed)
ANTICOAGULATION CONSULT NOTE - Initial Consult  Pharmacy Consult for heparin Indication: chest pain/ACS  Allergies  Allergen Reactions  . Codeine Itching    Patient Measurements: Height: 5\' 2"  (157.5 cm) Weight: 194 lb (88 kg) IBW/kg (Calculated) : 50.1 Heparin Dosing Weight: 69.7 kg  Vital Signs: Temp: 97.9 F (36.6 C) (08/21 2005) Temp Source: Oral (08/21 2005) BP: 146/65 (08/21 2005) Pulse Rate: 86 (08/21 2005)  Labs: Recent Labs    01/04/18 1258  01/05/18 0003 01/05/18 0840 01/05/18 1159 01/05/18 1805 01/06/18 0013  HGB 14.4  --  14.1  --   --   --  12.6  HCT 41.8  --  40.9  --   --   --  37.0  PLT 261  --  257  --   --   --  238  APTT  --   --   --   --  34  --   --   LABPROT  --   --   --   --  12.8  --   --   INR  --   --   --   --  0.97  --   --   HEPARINUNFRC  --   --   --   --   --  0.32 0.17*  CREATININE 0.74  --  0.85  --   --   --  0.71  TROPONINI <0.03   < > <0.03 0.10* 0.13* 0.19*  --    < > = values in this interval not displayed.    Estimated Creatinine Clearance: 80 mL/min (by C-G formula based on SCr of 0.71 mg/dL).   Medical History: Past Medical History:  Diagnosis Date  . Anxiety   . Asthma   . Hypertension   . Recurrent major depressive disorder, in partial remission (HCC) 09/17/2017   Assessment: Sherry Torres year old female admitted with chest pain. Pharmacy consulted for dosing of heparin for chest pain/ACS. Pt received one dose of prophylactic Lovenox 40 mg at approximately 0900  Goal of Therapy:  Heparin level 0.3-0.7 units/ml Monitor platelets by anticoagulation protocol: Yes   Plan:  Will continue heparin infusion at 800 units/hr and recheck a HL in 6 hours.   8/22 0000 HL 0.17. 2100 unit bolus and increase rate to 1050 units/hr. Recheck in 6 hours.  CBC ordered with morning labs.  Pharmacy will continue to monitor.  Erich MontaneMcBane,Korby Ratay S, PharmD Pharmacy Resident  01/06/2018 1:22 AM

## 2018-01-07 ENCOUNTER — Telehealth: Payer: Self-pay | Admitting: *Deleted

## 2018-01-07 ENCOUNTER — Encounter: Payer: Self-pay | Admitting: *Deleted

## 2018-01-07 LAB — CBC
HCT: 34 % — ABNORMAL LOW (ref 35.0–47.0)
Hemoglobin: 11.8 g/dL — ABNORMAL LOW (ref 12.0–16.0)
MCH: 30.7 pg (ref 26.0–34.0)
MCHC: 34.7 g/dL (ref 32.0–36.0)
MCV: 88.5 fL (ref 80.0–100.0)
PLATELETS: 205 10*3/uL (ref 150–440)
RBC: 3.84 MIL/uL (ref 3.80–5.20)
RDW: 14.4 % (ref 11.5–14.5)
WBC: 8.7 10*3/uL (ref 3.6–11.0)

## 2018-01-07 LAB — GLUCOSE, CAPILLARY: GLUCOSE-CAPILLARY: 137 mg/dL — AB (ref 70–99)

## 2018-01-07 MED ORDER — ISOSORBIDE MONONITRATE ER 30 MG PO TB24: 30.0000 mg | ORAL_TABLET | Freq: Every day | ORAL | 1 refills | Status: DC

## 2018-01-07 MED ORDER — NITROGLYCERIN 0.4 MG SL SUBL: 0.4000 mg | SUBLINGUAL_TABLET | SUBLINGUAL | 2 refills | Status: DC | PRN

## 2018-01-07 MED ORDER — CLOPIDOGREL BISULFATE 75 MG PO TABS: 75.0000 mg | ORAL_TABLET | Freq: Every day | ORAL | 2 refills | Status: DC

## 2018-01-07 MED ORDER — ATORVASTATIN CALCIUM 80 MG PO TABS: 80.0000 mg | ORAL_TABLET | Freq: Every day | ORAL | 1 refills | Status: DC

## 2018-01-07 MED ORDER — BISOPROLOL FUMARATE 5 MG PO TABS: 5.0000 mg | ORAL_TABLET | Freq: Every day | ORAL | 1 refills | Status: DC

## 2018-01-07 MED ORDER — ASPIRIN 81 MG PO CHEW: 81.0000 mg | CHEWABLE_TABLET | Freq: Every day | ORAL | 2 refills | Status: DC

## 2018-01-07 NOTE — Progress Notes (Signed)
Patient given all discharge instructions per MD order, all questions were answered. All belongings sent home with patient. Discharged home with husband via wheelchair.

## 2018-01-07 NOTE — Plan of Care (Signed)
  Problem: Activity: Goal: Risk for activity intolerance will decrease Outcome: Progressing Note:  Up to bathroom independently    Problem: Elimination: Goal: Will not experience complications related to urinary retention Outcome: Progressing   Problem: Pain Managment: Goal: General experience of comfort will improve Outcome: Progressing Note:  No complaints of pain throughout the shift, just soreness/achyness at right radial cath site.   Problem: Safety: Goal: Ability to remain free from injury will improve Outcome: Progressing   Problem: Skin Integrity: Goal: Risk for impaired skin integrity will decrease Outcome: Progressing   Problem: Activity: Goal: Ability to tolerate increased activity will improve Outcome: Progressing   Problem: Cardiovascular: Goal: Vascular access site(s) Level 0-1 will be maintained Outcome: Progressing Note:  Right radial cath site, clean dry and intact, level 0   Problem: Education: Goal: Knowledge of General Education information will improve Description Including pain rating scale, medication(s)/side effects and non-pharmacologic comfort measures Outcome: Completed/Met   Problem: Nutrition: Goal: Adequate nutrition will be maintained Outcome: Completed/Met   Problem: Coping: Goal: Level of anxiety will decrease Outcome: Completed/Met

## 2018-01-07 NOTE — Discharge Instructions (Signed)
Diet control, smoking cessation.

## 2018-01-07 NOTE — Telephone Encounter (Signed)
Admitted

## 2018-01-07 NOTE — Progress Notes (Signed)
Progress Note  Patient Name: Sherry Torres Date of Encounter: 01/07/2018  Primary Cardiologist: Yvonne Kendall, MD  Subjective   No chest pain or sob overnight. R wrist feels ok.  Hopeful for d/c.  Has ambulated some in the room w/o difficulty but hasn't walked in halls yet.  Inpatient Medications    Scheduled Meds: . aspirin  81 mg Oral Daily  . atorvastatin  80 mg Oral q1800  . bisoprolol  5 mg Oral Daily  . buPROPion  150 mg Oral BID  . clopidogrel  75 mg Oral Q breakfast  . enoxaparin (LOVENOX) injection  40 mg Subcutaneous Q24H  . insulin aspart  0-15 Units Subcutaneous TID WC  . insulin aspart  0-5 Units Subcutaneous QHS  . irbesartan  37.5 mg Oral Daily  . isosorbide mononitrate  30 mg Oral Daily  . sodium chloride flush  3 mL Intravenous Q12H   Continuous Infusions: . sodium chloride     PRN Meds: sodium chloride, acetaminophen, albuterol, bisacodyl, cyclobenzaprine, morphine injection, nicotine polacrilex, nitroGLYCERIN, ondansetron (ZOFRAN) IV, senna-docusate, sodium chloride flush   Vital Signs    Vitals:   01/06/18 1550 01/06/18 1952 01/07/18 0435 01/07/18 0729  BP: 126/61 110/64 116/64 (!) 125/45  Pulse: 81 63 63 (!) 58  Resp: 18 18 20 18   Temp: 97.8 F (36.6 C) 99.1 F (37.3 C) (!) 97.4 F (36.3 C) 97.9 F (36.6 C)  TempSrc: Oral Oral Oral Oral  SpO2: 96% 97% 94% 99%  Weight:      Height:        Intake/Output Summary (Last 24 hours) at 01/07/2018 0832 Last data filed at 01/07/2018 0435 Gross per 24 hour  Intake 1150.14 ml  Output 525 ml  Net 625.14 ml   Filed Weights   01/04/18 2359 01/05/18 1509 01/06/18 1006  Weight: 86.2 kg 88 kg 88.1 kg    Physical Exam   GEN: Well nourished, well developed, in no acute distress.  HEENT: Grossly normal.  Neck: Supple, no JVD, carotid bruits, or masses. Cardiac: RRR, no murmurs, rubs, or gallops. No clubbing, cyanosis, edema.  Radials/DP/PT 2+ and equal bilaterally. R wrist cath site w/o  bleeding/bruit/hematoma. Respiratory:  Respirations regular and unlabored, diminished breath sounds @ bilat bases, otw clear to auscultation bilaterally. GI: Soft, nontender, nondistended, BS + x 4. MS: no deformity or atrophy. Skin: warm and dry, no rash. Neuro:  Strength and sensation are intact. Psych: AAOx3.  Normal affect.  Labs    Chemistry Recent Labs  Lab 01/04/18 1258 01/05/18 0003 01/06/18 0013  NA 138 138 140  K 3.9 4.2 3.9  CL 103 103 107  CO2 26 28 26   GLUCOSE 85 139* 153*  BUN 20 22* 15  CREATININE 0.74 0.85 0.71  CALCIUM 9.2 9.1 8.6*  PROT  --  7.6  --   ALBUMIN  --  4.0  --   AST  --  16  --   ALT  --  16  --   ALKPHOS  --  54  --   BILITOT  --  0.2*  --   GFRNONAA >60 >60 >60  GFRAA >60 >60 >60  ANIONGAP 9 7 7      Hematology Recent Labs  Lab 01/05/18 0003 01/06/18 0013 01/07/18 0343  WBC 10.0 8.7 8.7  RBC 4.61 4.16 3.84  HGB 14.1 12.6 11.8*  HCT 40.9 37.0 34.0*  MCV 88.7 88.9 88.5  MCH 30.7 30.4 30.7  MCHC 34.6 34.1 34.7  RDW 14.7*  14.3 14.4  PLT 257 238 205    Cardiac Enzymes Recent Labs  Lab 01/05/18 0003 01/05/18 0840 01/05/18 1159 01/05/18 1805  TROPONINI <0.03 0.10* 0.13* 0.19*     Radiology    Nm Myocar Multi W/spect W/wall Motion / Ef  Result Date: 01/05/2018  Abnormal, probably low risk myocardial perfusion stress test.  There is a small in size, mild in severity, reversible defect involving the basal and mid inferolateral segments concerning for ischemia but cannot rule out artifact (attenuation and misregistration).  Left ventricular systolic function appears grossly normal, though EF cannot be calculated due to significant extracardiac activity.     Telemetry    RSR/sinus brady - Personally Reviewed  Cardiac Studies   Lexiscan Cardiolite 8.21.2019  Abnormal, probably low risk myocardial perfusion stress test. There is a small in size, mild in severity, reversible defect involving the basal and mid  inferolateral segments concerning for ischemia but cannot rule out artifact (attenuation and misregistration). Left ventricular systolic function appears grossly normal, though EF cannot be calculated due to significant extracardiac activity. _____________   2D Echocardiogram 8.21.2019  Study Conclusions  - Left ventricle: The cavity size was normal. Wall thickness was normal. Systolic function was normal. The estimated ejection fraction was in the range of 55% to 60%. Wall motion was normal; there were no regional wall motion abnormalities. Doppler parameters are consistent with abnormal left ventricular relaxation (grade 1 diastolic dysfunction). _____________  Cardiac Catheterization 8.22.2019   Mid RCA lesion is 95% stenosed.  Ost Cx lesion is 40% stenosed.  Dist Cx lesion is 95% stenosed.  Ost LAD to Prox LAD lesion is 30% stenosed.   1.  Significant one-vessel coronary artery disease and a left dominant system.  The culprit for non-ST elevation myocardial infarction seems to be a significant stenosis involving the distal left circumflex at the bifurcation of an OM branch.  The vessel is 1.5 mm at most in that area and not optimal for PCI.  There is also significant stenosis in a nondominant right coronary artery.   2.  Normal LV systolic function by echo.  Mildly elevated left ventricular end-diastolic pressure.   Recommendations: Recommend medical therapy for coronary artery disease, smoking cessation and cardiac rehab I added Plavix and Imdur.  Monitor the patient for another day and if stable, can discharge tomorrow. Avoid catheterization via the right radial artery in the future due to radial loop. _____________    Patient Profile     57 y.o. female w/ a h/o DMII, HTN, obesity, and tob abuse, who was admitted with sscp/NSTEMI with subsequent abnl stress test. Cath 8/22 with severe small vessel distal LCX (1.595mm vessel) and mRCA (nondominant)  dzs.  Assessment & Plan    1.  NSTEMI/CAD:  Admitted w/ sscp.  Trop peaked @ 0.19. Nl EF by echo.  MV w/ basal and mid inflat ischemia.  Cath revealed severe distal LCX (1.755mm vessel) and mRCA dzs (nondominant).  Suspect dLCX dzs is culprit but too small for intervention.  Med rx rec. No recurrent c/p.  Ambulate this AM. Cont asa, plavix,  blocker, arb, and high potency statin therapy.  Will benefit from outpt cardiac rehab.  We will arrange for f/u within the next week.  2.  Essential HTN:  Improved with addition of  blocker and nitrate.  Cont arb.  3.  HL:  LDL 113.  Cont lipitor 80.  4.  DM II:  A1c 6.5.  Metformin on hold.  Resume tomorrow.  5.  Tob Abuse:  Cessation advised.  Signed, Nicolasa Ducking, NP  01/07/2018, 8:32 AM    For questions or updates, please contact   Please consult [email or web address] for contact info under Cardiology/STEMI.

## 2018-01-07 NOTE — Discharge Summary (Signed)
[facility] [place] [facility]   PATIENT NAME: Sherry Torres    MR#:  [id number]  DATE OF BIRTH:  [birth year]  DATE OF ADMISSION:  01/05/2018   ADMITTING PHYSICIAN: Barbaraann Rondo, MD  DATE OF DISCHARGE: 01/07/2018 PRIMARY CARE PHYSICIAN: Carlean Jews, NP   ADMISSION DIAGNOSIS:  Chest pain, unspecified type [R07.9] DISCHARGE DIAGNOSIS:  Active Problems:   Chest pain   NSTEMI (non-ST elevated myocardial infarction) (HCC)  SECONDARY DIAGNOSIS:   Past Medical History:  Diagnosis Date  . Anxiety   . Asthma   . Hypertension   . Recurrent major depressive disorder, in partial remission (HCC) 09/17/2017   HOSPITAL COURSE:  NSTEMI, Troponin was elevated up to 0.1 then 0.19, even on heparin drip. She was given aspirin and Lipitor. Abnormal stress test. S/P cardiac cath, which show one-vessel CAD. Medical treatment, continue aspirin and Lipitor, added Plavix, bisoprolol and Imdur per Dr. Kirke Corin.  Hyperlipidemia and obesity.  LDL 113.  Started Lipitor.  Advised diet control and exercise. Diabetes sliding scale. Resume metformin after discharge. Hypertension.  Continue hypertension medication. Tobacco abuse.  Smoking cessation was counseled for 3 to 4 minutes.  Patient wants to quit. DISCHARGE CONDITIONS:  Stable, discharge to home today. CONSULTS OBTAINED:  Treatment Team:  Barbaraann Rondo, MD End, Cristal Deer, MD DRUG ALLERGIES:   Allergies  Allergen Reactions  . Codeine Itching   DISCHARGE MEDICATIONS:   Allergies as of 01/07/2018      Reactions   Codeine Itching      Medication List    STOP taking these medications   hydrochlorothiazide 25 MG tablet Commonly known as:  HYDRODIURIL   meloxicam 7.5 MG tablet Commonly known as:  MOBIC     TAKE these medications   aspirin 81 MG chewable tablet Chew 1 tablet (81 mg total) by mouth daily.   atorvastatin 80 MG tablet Commonly known as:  LIPITOR Take 1 tablet (80 mg total)  by mouth daily at 6 PM.   bisoprolol 5 MG tablet Commonly known as:  ZEBETA Take 1 tablet (5 mg total) by mouth daily.   buPROPion 150 MG 12 hr tablet Commonly known as:  ZYBAN Take 1 tablet (150 mg total) by mouth 2 (two) times daily.   clopidogrel 75 MG tablet Commonly known as:  PLAVIX Take 1 tablet (75 mg total) by mouth daily with breakfast.   cyclobenzaprine 5 MG tablet Commonly known as:  FLEXERIL Take 1 tablet (5 mg total) by mouth 3 (three) times daily as needed for muscle spasms.   diclofenac sodium 1 % Gel Commonly known as:  VOLTAREN Apply 4 g topically 4 (four) times daily.   isosorbide mononitrate 30 MG 24 hr tablet Commonly known as:  IMDUR Take 1 tablet (30 mg total) by mouth daily.   metFORMIN 500 MG tablet Commonly known as:  GLUCOPHAGE Take 0.5 tablets (250 mg total) by mouth 2 (two) times daily with a meal.   nitroGLYCERIN 0.4 MG SL tablet Commonly known as:  NITROSTAT Place 1 tablet (0.4 mg total) under the tongue every 5 (five) minutes as needed for chest pain.   olmesartan 5 MG tablet Commonly known as:  BENICAR Take 1 tablet (5 mg total) by mouth daily.   VENTOLIN HFA 108 (90 Base) MCG/ACT inhaler Generic drug:  albuterol Inhale 1-2 puffs into the lungs daily.        DISCHARGE INSTRUCTIONS:  See AVS.  If you experience worsening of your admission symptoms, develop shortness of breath,  life threatening emergency, suicidal or homicidal thoughts you must seek medical attention immediately by calling 911 or calling your MD immediately  if symptoms less severe.  You Must read complete instructions/literature along with all the possible adverse reactions/side effects for all the Medicines you take and that have been prescribed to you. Take any new Medicines after you have completely understood and accpet all the possible adverse reactions/side effects.   Please note  You were cared for by a hospitalist during your hospital stay. If you have any  questions about your discharge medications or the care you received while you were in the hospital after you are discharged, you can call the unit and asked to speak with the hospitalist on call if the hospitalist that took care of you is not available. Once you are discharged, your primary care physician will handle any further medical issues. Please note that NO REFILLS for any discharge medications will be authorized once you are discharged, as it is imperative that you return to your primary care physician (or establish a relationship with a primary care physician if you do not have one) for your aftercare needs so that they can reassess your need for medications and monitor your lab values.    On the day of Discharge:  VITAL SIGNS:  Blood pressure (!) 125/45, pulse (!) 58, temperature 97.9 F (36.6 C), temperature source Oral, resp. rate 18, height 5\' 2"  (1.575 m), weight 88.1 kg, SpO2 99 %. PHYSICAL EXAMINATION:  GENERAL:  57 y.o.-year-old patient lying in the bed with no acute distress.  EYES: Pupils equal, round, reactive to light and accommodation. No scleral icterus. Extraocular muscles intact.  HEENT: Head atraumatic, normocephalic. Oropharynx and nasopharynx clear.  NECK:  Supple, no jugular venous distention. No thyroid enlargement, no tenderness.  LUNGS: Normal breath sounds bilaterally, no wheezing, rales,rhonchi or crepitation. No use of accessory muscles of respiration.  CARDIOVASCULAR: S1, S2 normal. No murmurs, rubs, or gallops.  ABDOMEN: Soft, non-tender, non-distended. Bowel sounds present. No organomegaly or mass.  EXTREMITIES: No pedal edema, cyanosis, or clubbing.  NEUROLOGIC: Cranial nerves II through XII are intact. Muscle strength 5/5 in all extremities. Sensation intact. Gait not checked.  PSYCHIATRIC: The patient is alert and oriented x 3.  SKIN: No obvious rash, lesion, or ulcer.  DATA REVIEW:   CBC Recent Labs  Lab 01/07/18 0343  WBC 8.7  HGB 11.8*  HCT  34.0*  PLT 205    Chemistries  Recent Labs  Lab 01/05/18 0003 01/06/18 0013  NA 138 140  K 4.2 3.9  CL 103 107  CO2 28 26  GLUCOSE 139* 153*  BUN 22* 15  CREATININE 0.85 0.71  CALCIUM 9.1 8.6*  AST 16  --   ALT 16  --   ALKPHOS 54  --   BILITOT 0.2*  --      Microbiology Results  No results found for this or any previous visit.  RADIOLOGY:  No results found.   Management plans discussed with the patient, family and they are in agreement.  CODE STATUS: Full Code   TOTAL TIME TAKING CARE OF THIS PATIENT: 33 minutes.    Shaune PollackQing [place] Square M.D on 01/07/2018 at 10:45 AM  Between 7am to 6pm - Pager - [phone]  After 6pm go to [email or web address] - Social research officer, governmentpassword EPAS [facility]  [facility] [place] [facility]  Office  [phone]  CC: Primary care physician; Carlean JewsBoscia, Heather E, NP   Note: This dictation was prepared with Dragon dictation along with smaller phrase  technology. Any transcriptional errors that result from this process are unintentional.

## 2018-01-07 NOTE — Care Management (Signed)
RNCM updated Utilization Review team that patient is being discharged.

## 2018-01-07 NOTE — Telephone Encounter (Signed)
-----   Message from Coralee RudSabrina F Gilley sent at 01/07/2018  9:21 AM EDT ----- Regarding: toc 8/29 10:30 Eula Listenyan Dunn, PA

## 2018-01-10 NOTE — Telephone Encounter (Signed)
[facility].  Patient understands to follow up with provider Eula Listenyan Dunn PA on 01/13/18 at 10:30AM [facility]. Patient understands discharge instructions? Yes Patient understands medications and regiment? Yes Patient understands to bring all medications to this visit? Yes  Patient confirmed appointment information with no further questions at this time.

## 2018-01-11 ENCOUNTER — Ambulatory Visit: Payer: BLUE CROSS/BLUE SHIELD | Admitting: Adult Health

## 2018-01-11 ENCOUNTER — Encounter: Payer: Self-pay | Admitting: Physician Assistant

## 2018-01-11 ENCOUNTER — Ambulatory Visit (INDEPENDENT_AMBULATORY_CARE_PROVIDER_SITE_OTHER): Payer: BLUE CROSS/BLUE SHIELD | Admitting: Nurse Practitioner

## 2018-01-11 ENCOUNTER — Encounter: Payer: Self-pay | Admitting: Nurse Practitioner

## 2018-01-11 VITALS — BP 143/57 | HR 57 | Resp 16 | Ht 62.0 in | Wt 195.6 lb

## 2018-01-11 DIAGNOSIS — F5102 Adjustment insomnia: Secondary | ICD-10-CM | POA: Insufficient documentation

## 2018-01-11 DIAGNOSIS — E119 Type 2 diabetes mellitus without complications: Secondary | ICD-10-CM

## 2018-01-11 DIAGNOSIS — M25512 Pain in left shoulder: Secondary | ICD-10-CM | POA: Insufficient documentation

## 2018-01-11 DIAGNOSIS — M19012 Primary osteoarthritis, left shoulder: Secondary | ICD-10-CM

## 2018-01-11 DIAGNOSIS — I10 Essential (primary) hypertension: Secondary | ICD-10-CM | POA: Diagnosis not present

## 2018-01-11 DIAGNOSIS — I214 Non-ST elevation (NSTEMI) myocardial infarction: Secondary | ICD-10-CM

## 2018-01-11 MED ORDER — TRAZODONE HCL 50 MG PO TABS: 25.0000 mg | ORAL_TABLET | Freq: Every evening | ORAL | 3 refills | Status: DC | PRN

## 2018-01-11 MED ORDER — HYDROCHLOROTHIAZIDE 12.5 MG PO TABS: 12.5000 mg | ORAL_TABLET | Freq: Every day | ORAL | 3 refills | Status: DC

## 2018-01-11 NOTE — Progress Notes (Signed)
[facility] [address] [place], [place] [id number]  Internal MEDICINE  Office Visit Note  Patient Name: Sherry Torres  [id number]  [id number]  Date of Service: 01/11/2018   Pt is here for recent hospital follow up.    Chief Complaint  Patient presents with  . Chest Pain    hospital follow up. pt concerned about hospital taking her off her fluid pill and now she is noticing some fluid   . Quality Metric Gaps    foot exam     The patient was recently hospitalized. After being seen here for left shoulder pain, the pain continued to get worse, radiating into her left chest. Was admitted on Tuesday night. Had cardiac catheterization done, with blockage in one of the cardiac vessels. Is being treated medically for this per Dr. Kirke CorinArida. Was told that blockage did not do any damage to the heart. She is not on plavix and  Imdur. Was taken off HCTZ.  Has noted feet swelling since her release. Has had a 5 pound weight gain since release. She sees cardiology for follow up on Thursday. She does state that she has complete relief of shoulder and chest pain since being released from the hospital.  She admits to having some trouble sleeping since she was hospitalized. Feels like she has increased anxiety and that is keeping her awake.     Current Medication: Outpatient Encounter Medications as of 01/11/2018  Medication Sig Note  . aspirin 81 MG chewable tablet Chew 1 tablet (81 mg total) by mouth daily.   Marland Kitchen. atorvastatin (LIPITOR) 80 MG tablet Take 1 tablet (80 mg total) by mouth daily at 6 PM.   . bisoprolol (ZEBETA) 5 MG tablet Take 1 tablet (5 mg total) by mouth daily.   Marland Kitchen. buPROPion (ZYBAN) 150 MG 12 hr tablet Take 1 tablet (150 mg total) by mouth 2 (two) times daily.   . clopidogrel (PLAVIX) 75 MG tablet Take 1 tablet (75 mg total) by mouth daily with breakfast.   . cyclobenzaprine (FLEXERIL) 5 MG tablet Take 1 tablet (5 mg total) by mouth 3 (three) times daily as needed for  muscle spasms.   . diclofenac sodium (VOLTAREN) 1 % GEL Apply 4 g topically 4 (four) times daily. 01/05/2018: Insurance has not approved, did not pick up from pharmacy  . isosorbide mononitrate (IMDUR) 30 MG 24 hr tablet Take 1 tablet (30 mg total) by mouth daily.   . metFORMIN (GLUCOPHAGE) 500 MG tablet Take 0.5 tablets (250 mg total) by mouth 2 (two) times daily with a meal.   . nitroGLYCERIN (NITROSTAT) 0.4 MG SL tablet Place 1 tablet (0.4 mg total) under the tongue every 5 (five) minutes as needed for chest pain.   Marland Kitchen. olmesartan (BENICAR) 5 MG tablet Take 1 tablet (5 mg total) by mouth daily.   . VENTOLIN HFA 108 (90 Base) MCG/ACT inhaler Inhale 1-2 puffs into the lungs daily.   . hydrochlorothiazide (HYDRODIURIL) 12.5 MG tablet Take 1 tablet (12.5 mg total) by mouth daily.   . traZODone (DESYREL) 50 MG tablet Take 0.5-1 tablets (25-50 mg total) by mouth at bedtime as needed for sleep.    No facility-administered encounter medications on file as of 01/11/2018.     Surgical History: Past Surgical History:  Procedure Laterality Date  . LEFT HEART CATH AND CORONARY ANGIOGRAPHY N/A 01/06/2018   Procedure: LEFT HEART CATH AND CORONARY ANGIOGRAPHY;  Surgeon: Iran OuchArida, Muhammad A, MD;  Location: [facility] INVASIVE CV LAB;  Service: Cardiovascular;  Laterality: N/A;    Medical History: Past Medical History:  Diagnosis Date  . Anxiety   . Asthma   . CAD (coronary artery disease)    a. NSTEMI 8/19; b. LHC 8/19: sig 1V CAD & left dominant system. The culprit for the NSTEMI was 95% dLCx @ the bifurcation of an OM branch. The vessel was 1.5 mm at most in an area that was not optimal for PCI, med Rx, she was also noted to have 40% ost LCx & 95% mRCA (nondominant), mildly elevated LVEDP  . Diastolic dysfunction    a. echo 8/19:  EF 55-60%, normal wall motion, Gr1DD  . Hypertension   . Recurrent major depressive disorder, in partial remission (HCC) 09/17/2017    Family History: Family History  Problem  Relation Age of Onset  . Cancer Mother   . Breast cancer Mother 63  . Hyperlipidemia Father     Social History   Socioeconomic History  . Marital status: Married    Spouse name: Not on file  . Number of children: Not on file  . Years of education: Not on file  . Highest education level: Not on file  Occupational History  . Not on file  Social Needs  . Financial resource strain: Not on file  . Food insecurity:    Worry: Not on file    Inability: Not on file  . Transportation needs:    Medical: Not on file    Non-medical: Not on file  Tobacco Use  . Smoking status: Former Smoker    Types: Cigarettes  . Smokeless tobacco: Never Used  . Tobacco comment: hasnt smoked in 1 wk  Substance and Sexual Activity  . Alcohol use: No    Frequency: Never  . Drug use: No  . Sexual activity: Not on file  Lifestyle  . Physical activity:    Days per week: Not on file    Minutes per session: Not on file  . Stress: Not on file  Relationships  . Social connections:    Talks on phone: Not on file    Gets together: Not on file    Attends religious service: Not on file    Active member of club or organization: Not on file    Attends meetings of clubs or organizations: Not on file    Relationship status: Not on file  . Intimate partner violence:    Fear of current or ex partner: Not on file    Emotionally abused: Not on file    Physically abused: Not on file    Forced sexual activity: Not on file  Other Topics Concern  . Not on file  Social History Narrative  . Not on file      Review of Systems  Constitutional: Positive for fatigue. Negative for activity change and appetite change.  HENT: Negative for congestion, postnasal drip, rhinorrhea, sinus pressure and voice change.   Eyes: Negative.   Respiratory: Negative for cough, chest tightness, shortness of breath and wheezing.   Cardiovascular: Negative for chest pain and palpitations.       Elevated blood pressure. Chest pain  has resolved since her last visit.   Gastrointestinal: Negative for constipation, diarrhea, nausea and vomiting.  Endocrine: Negative for polydipsia and polyuria.       Improving blood sugars.   Genitourinary: Negative.  Negative for frequency.  Musculoskeletal: Positive for arthralgias. Negative for neck pain.       Lleft shoulder pain has resolved since her last visit .  Skin: Negative.  Negative for rash.  Allergic/Immunologic: Negative for environmental allergies.  Neurological: Negative for dizziness, weakness, light-headedness and headaches.  Hematological: Negative for adenopathy. Does not bruise/bleed easily.  Psychiatric/Behavioral: Positive for sleep disturbance. Negative for agitation. The patient is nervous/anxious.     Today's Vitals   01/11/18 1029  BP: (!) 143/57  Pulse: (!) 57  Resp: 16  SpO2: 96%  Weight: 195 lb 9.6 oz (88.7 kg)  Height: 5\' 2"  (1.575 m)    Physical Exam  Constitutional: She is oriented to person, place, and time. She appears well-developed and well-nourished. No distress.  HENT:  Head: Normocephalic and atraumatic.  Nose: Nose normal.  Mouth/Throat: Oropharynx is clear and moist. No oropharyngeal exudate.  Eyes: Pupils are equal, round, and reactive to light. Conjunctivae and EOM are normal.  Neck: Normal range of motion. Neck supple. No JVD present. Carotid bruit is not present. No tracheal deviation present. No thyromegaly present.  Cardiovascular: Normal rate, regular rhythm and normal heart sounds. Exam reveals no gallop and no friction rub.  No murmur heard. Pulmonary/Chest: Effort normal and breath sounds normal. No respiratory distress. She has no wheezes. She has no rales. She exhibits no tenderness.  Abdominal: Soft. Bowel sounds are normal. There is no tenderness.  Musculoskeletal: Normal range of motion.  Left shoulder pain has resolved. She has good ROM and strength of left shoulder .  Lymphadenopathy:    She has no cervical  adenopathy.  Neurological: She is alert and oriented to person, place, and time. No cranial nerve deficit.  Skin: Skin is warm and dry. She is not diaphoretic.  Psychiatric: She has a normal mood and affect. Her behavior is normal. Judgment and thought content normal.  Nursing note and vitals reviewed.  Assessment/Plan: 1. NSTEMI (non-ST elevated myocardial infarction) [facility]) Reviewed progress notes, labs, and imaging studies from recent hospitalization. Patient to follow up with cadiology this comintg thyrsdad for further evaluation and recommendations about returning to work.   2. Essential hypertension Add back HCTZ 12.5mg  tablets daily. Continue other bp medication as prescribed . - hydrochlorothiazide (HYDRODIURIL) 12.5 MG tablet; Take 1 tablet (12.5 mg total) by mouth daily.  Dispense: 90 tablet; Refill: 3  3. Primary osteoarthritis of left shoulder Resolved.   4. Adjustment insomnia Add trazodone 50mg  tablets. May take 1/2 to 1 tablet at bedtime as needed for insomnia.  - traZODone (DESYREL) 50 MG tablet; Take 0.5-1 tablets (25-50 mg total) by mouth at bedtime as needed for sleep.  Dispense: 30 tablet; Refill: 3  5. Type 2 diabetes mellitus without complication, without long-term current use of insulin (HCC) Continue diabetic medication as prescribed .  General Counseling: Sherry Torres verbalizes understanding of the findings of todays visit and agrees with plan of treatment. I have discussed any further diagnostic evaluation that may be needed or ordered today. We also reviewed her medications today. she has been encouraged to call the office with any questions or concerns that should arise related to todays visit.    Counseling:  Cardiac risk factor modification:  1. Control blood pressure. 2. Exercise as prescribed. 3. Follow low sodium, low fat diet. and low fat and low cholestrol diet. 4. Take ASA 81mg  once a day. 5. Restricted calories diet to lose weight.  This patient  was seen by Vincent Gros FNP Collaboration with Dr Lyndon Code as a part of collaborative care agreement    I have reviewed all medical records from hospital follow up including radiology reports and consults from  other physicians. Appropriate follow up diagnostics will be scheduled as needed. Patient/ Family understands the plan of treatment. Time spent 25 minutes.   Dr Lyndon Code, MD Internal Medicine

## 2018-01-11 NOTE — Progress Notes (Signed)
Office Visit    Patient Name: Sherry Torres Date of Encounter: 01/13/2018  Primary Care Provider:  Carlean Jews, NP Primary Cardiologist:  Yvonne Kendall, MD  Chief Complaint    57 y/o ? with a history of type 2 diabetes mellitus, hypertension, obesity, tobacco abuse, and recent admission for non-STEMI with finding of small vessel coronary artery disease, who presents for follow-up.  Past Medical History    Past Medical History:  Diagnosis Date  . Anxiety   . Asthma   . CAD (coronary artery disease)    a. 12/2017 NSTEMI;  b. 12/2017 MV: basal and mid inflat ischemia; c. 12/2017 Cath: LM nl, LAD 30ost, D1 mild dzs, LCX 40ost, 95d, RCA 72m (nondominant). LCX felt to be culprit but only 1.82mm vessel-->Med rx.  . Diastolic dysfunction    a. 12/2017 Echo: EF 55-60%, no rwma, Gr1 DD.  Marland Kitchen Hypertension   . Recurrent major depressive disorder, in partial remission (HCC) 09/17/2017  . Tobacco abuse   . Type 2 diabetes mellitus (HCC)    Past Surgical History:  Procedure Laterality Date  . LEFT HEART CATH AND CORONARY ANGIOGRAPHY N/A 01/06/2018   Procedure: LEFT HEART CATH AND CORONARY ANGIOGRAPHY;  Surgeon: Iran Ouch, MD;  Location: [facility] INVASIVE CV LAB;  Service: Cardiovascular;  Laterality: N/A;    Allergies  Allergies  Allergen Reactions  . Codeine Itching    History of Present Illness    57 year old female with the above past medical history including tobacco abuse, hypertension, depression, anxiety, and type 2 diabetes mellitus.  She was recently admitted to [facility] regional after presenting with substernal chest pain and ruled in for non-STEMI with a troponin of 0.19.  Echocardiogram showed normal LV function.  A Myoview was undertaken and showed basal and mid inferolateral ischemia.  She continued to have intermittent chest discomfort and decision was made to pursue diagnostic catheterization.  This was performed on August 22 and showed severe circumflex and  RCA disease.  The circumflex was felt to be the culprit however, it was a 1.5 mm vessel and not operable for PCI.  The RCA was nondominant.  Medical therapy was recommended and she was placed on aspirin, Plavix, beta-blocker, ARB, nitrate, and high potency statin therapy.  She was subsequently discharged August 23.  At discharge, hydrochlorothiazide was held considering the addition of beta-blocker therapy.  In that setting, she noted increasing lower extremity swelling.  HCTZ was resumed by primary care.  Since then, lower extremity swelling has improved.  Since discharge, she has mostly done quite well.  She had one brief episode of chest discomfort that occurred at rest but has been ambulated without difficulty.  She denies PND, orthopnea, dizziness, syncope, or early satiety.  Thus far, she is tolerating medications well.  She is interested in participating in cardiac rehabilitation and notes that a referral had previously been made.  She has not smoked since leaving the hospital.  She hopes to return to work in the next 2 weeks or so.  Home Medications    Prior to Admission medications   Medication Sig Start Date End Date Taking? Authorizing Provider  aspirin 81 MG chewable tablet Chew 1 tablet (81 mg total) by mouth daily. 01/07/18  Yes Shaune Pollack, MD  atorvastatin (LIPITOR) 80 MG tablet Take 1 tablet (80 mg total) by mouth daily at 6 PM. 01/07/18  Yes Shaune Pollack, MD  bisoprolol (ZEBETA) 5 MG tablet Take 1 tablet (5 mg total) by mouth daily.  01/07/18  Yes Shaune Pollack, MD  buPROPion (ZYBAN) 150 MG 12 hr tablet Take 1 tablet (150 mg total) by mouth 2 (two) times daily. 09/17/17  Yes Carlean Jews, NP  clopidogrel (PLAVIX) 75 MG tablet Take 1 tablet (75 mg total) by mouth daily with breakfast. 01/07/18  Yes Shaune Pollack, MD  cyclobenzaprine (FLEXERIL) 5 MG tablet Take 1 tablet (5 mg total) by mouth 3 (three) times daily as needed for muscle spasms. 09/17/17  Yes Boscia, Heather E, NP  diclofenac sodium  (VOLTAREN) 1 % GEL Apply 4 g topically 4 (four) times daily. 01/03/18  Yes Boscia, Heather E, NP  hydrochlorothiazide (HYDRODIURIL) 12.5 MG tablet Take 1 tablet (12.5 mg total) by mouth daily. 01/11/18  Yes Boscia, Kathlynn Grate, NP  isosorbide mononitrate (IMDUR) 30 MG 24 hr tablet Take 1 tablet (30 mg total) by mouth daily. 01/07/18  Yes Shaune Pollack, MD  metFORMIN (GLUCOPHAGE) 500 MG tablet Take 0.5 tablets (250 mg total) by mouth 2 (two) times daily with a meal. 12/08/17  Yes Boscia, Heather E, NP  nitroGLYCERIN (NITROSTAT) 0.4 MG SL tablet Place 1 tablet (0.4 mg total) under the tongue every 5 (five) minutes as needed for chest pain. 01/07/18  Yes Shaune Pollack, MD  olmesartan (BENICAR) 5 MG tablet Take 1 tablet (5 mg total) by mouth daily. 12/07/17  Yes Carlean Jews, NP  traZODone (DESYREL) 50 MG tablet Take 0.5-1 tablets (25-50 mg total) by mouth at bedtime as needed for sleep. 01/11/18  Yes Boscia, Heather E, NP  VENTOLIN HFA 108 (90 Base) MCG/ACT inhaler Inhale 1-2 puffs into the lungs daily. 11/09/17  Yes Carlean Jews, NP    Review of Systems    One brief episode of chest discomfort that occurred at rest.  She has not had any exertional chest pain, dyspnea, PND, orthopnea, dizziness, syncope, or early satiety.  As noted above, she did have some lower extremity swelling that improved following resumption of HCTZ.  All other systems reviewed and are otherwise negative except as noted above.  Physical Exam    VS:  BP 138/68 (BP Location: Left Arm, Patient Position: Sitting, Cuff Size: Large)   Pulse (!) 55   Ht 5\' 2"  (1.575 m)   Wt 194 lb 8 oz (88.2 kg)   BMI 35.57 kg/m  , BMI Body mass index is 35.57 kg/m. GEN: Well nourished, well developed, in no acute distress.  HEENT: normal.  Neck: Supple, no JVD, carotid bruits, or masses. Cardiac: RRR, no murmurs, rubs, or gallops. No clubbing, cyanosis, edema.  Radials/DP/PT 2+ and equal bilaterally. Right wrist catheterization site without  bleeding, bruit, or hematoma. Respiratory:  Respirations regular and unlabored, diminished bilaterally. GI: Soft, nontender, nondistended, BS + x 4. MS: no deformity or atrophy. Skin: warm and dry, no rash. Neuro:  Strength and sensation are intact. Psych: Normal affect.  Accessory Clinical Findings    ECG -sinus bradycardia, 55, no acute ST or T changes.  Assessment & Plan    1.  Non-STEMI, subsequent episode of care/coronary artery disease: Patient recently hospitalized with chest discomfort and ruled in for non-STEMI.  She had an abnormal stress test which led to catheterization revealing severe left circumflex and RCA disease.  The circumflex was felt to be the culprit but was only 1.5 mm in diameter and thus not ideal for PCI.  The RCA is nondominant.  She is being medically managed with aspirin, Plavix, statin, beta-blocker, and nitrate therapy.  She did have one brief  episode of chest discomfort that occurred at rest last week.  We discussed her catheterization results at length and the role of medical therapy and trying to limit symptoms and prevent future events.  She verbalized understanding.  She understands how to use sublingual nitroglycerin if necessary.  We also discussed the benefits of cardiac rehabilitation and she is interested in participating.  A referral had already been made during her hospitalization.  2.  Essential hypertension: Stable on current regimen, which includes beta-blocker, ARB, nitrate, and diuretic.  3.  Hyperlipidemia: LDL 95 on August 22.  Continue high potency statin therapy.  Follow-up lipids and LFTs in 6 weeks.  4.  Borderline diabetes: A1c 6.5.  On metformin per primary care.  5.  Tobacco abuse: Patient has not smoked since hospital discharge.  I congratulated her on this and encouraged her to remain off of cigarettes.  6.  Disposition: Follow-up in 6 weeks with fasting lipids and LFTs at that time.   Nicolasa Duckinghristopher Berge, NP 01/13/2018, 12:08  PM

## 2018-01-13 ENCOUNTER — Ambulatory Visit: Payer: BLUE CROSS/BLUE SHIELD | Admitting: Nurse Practitioner

## 2018-01-13 ENCOUNTER — Encounter: Payer: Self-pay | Admitting: Nurse Practitioner

## 2018-01-13 VITALS — BP 138/68 | HR 55 | Ht 62.0 in | Wt 194.5 lb

## 2018-01-13 DIAGNOSIS — I25119 Atherosclerotic heart disease of native coronary artery with unspecified angina pectoris: Secondary | ICD-10-CM

## 2018-01-13 DIAGNOSIS — I214 Non-ST elevation (NSTEMI) myocardial infarction: Secondary | ICD-10-CM | POA: Diagnosis not present

## 2018-01-13 DIAGNOSIS — I10 Essential (primary) hypertension: Secondary | ICD-10-CM | POA: Diagnosis not present

## 2018-01-13 DIAGNOSIS — E785 Hyperlipidemia, unspecified: Secondary | ICD-10-CM | POA: Diagnosis not present

## 2018-01-13 DIAGNOSIS — Z720 Tobacco use: Secondary | ICD-10-CM

## 2018-01-13 MED ORDER — ISOSORBIDE MONONITRATE ER 30 MG PO TB24: 30.0000 mg | ORAL_TABLET | Freq: Every day | ORAL | 3 refills | Status: DC

## 2018-01-13 MED ORDER — BISOPROLOL FUMARATE 5 MG PO TABS: 5.0000 mg | ORAL_TABLET | Freq: Every day | ORAL | 3 refills | Status: DC

## 2018-01-13 MED ORDER — ATORVASTATIN CALCIUM 80 MG PO TABS: 80.0000 mg | ORAL_TABLET | Freq: Every day | ORAL | 3 refills | Status: DC

## 2018-01-13 MED ORDER — CLOPIDOGREL BISULFATE 75 MG PO TABS: 75.0000 mg | ORAL_TABLET | Freq: Every day | ORAL | 3 refills | Status: DC

## 2018-01-13 NOTE — Patient Instructions (Signed)
Medication Instructions:  Your physician recommends that you continue on your current medications as directed. Please refer to the Current Medication list given to you today.   Labwork: Your physician recommends that you return for lab work in: 6 WEEKS - AROUND October 10TH TO CHECK YOU CHOLESTEROL AND LIVER FUNCTION. - [facility]. You will check in at the front desk to the right as you walk into the atrium. Valet Parking is offered if needed.  - YOU WILL NEED TO BE FASTING.  Testing/Procedures: NONE  Follow-Up: Your physician recommends that you schedule a follow-up appointment in: 6 WEEKS WITH DR END.   If you need a refill on your cardiac medications before your next appointment, please call your pharmacy.

## 2018-01-20 ENCOUNTER — Telehealth: Payer: Self-pay | Admitting: Internal Medicine

## 2018-01-20 NOTE — Telephone Encounter (Signed)
I concur

## 2018-01-20 NOTE — Telephone Encounter (Signed)
Called patient. Symptoms: GI upset for the past week. Occurs around 10 pm at night and may last through the night, keeps her up. Started about 1 week ago. It does not occur during the daytime hours. Stomach "rolls," feels bloated, has gas. Sometimes feels indigestion. Denies chest pain, shortness of breath, jaw or left arm pain. Has been consuming an increased amount of "sugar-free" foods that she recently read could cause similar symptoms. She stopped those yesterday. Not sure if its coming from new medications from recent hospitalization that are new for her (Imdur, plavix, bisoprolol or atorvastatin). Been taking Pepto and Gas-X.  Advised patient to stop the sugar-free foods for now and follow up with PCP. Will route to Ward Givens, NP who last saw patient on 01/13/18.

## 2018-01-20 NOTE — Telephone Encounter (Signed)
Pt c/o of Chest Pain: STAT if CP now or developed within 24 hours  1. Are you having CP right now? No   2. Are you experiencing any other symptoms (ex. SOB, nausea, vomiting, sweating)?  Sweating interim gi upset at night   3. How long have you been experiencing CP?  Once episode last night GI issues for 1 week   4. Is your CP continuous or coming and going? Comes and goes   5. Have you taken Nitroglycerin? No   On new medications not sure if this is what is causing the issues  ?

## 2018-01-21 NOTE — Telephone Encounter (Signed)
S/w patient. She proceeded to say that last night it was less in her stomach and more in her shoulder, back, and across her chest.  She said when she sits up and takes a few deep breaths the discomfort eases up.   Discussed further with Ward Givens, NP. He recommends patient could try Prilosec OTC and f/u with PCP regarding further treatment and regimen as he feels this is not cardiac at this time.  Patient verbalized understanding of recommendations and will call back if any new concerns or questions arise.

## 2018-01-24 ENCOUNTER — Encounter: Payer: BLUE CROSS/BLUE SHIELD | Attending: Cardiovascular Disease | Admitting: *Deleted

## 2018-01-24 VITALS — Ht 61.8 in | Wt 192.1 lb

## 2018-01-24 DIAGNOSIS — Z79899 Other long term (current) drug therapy: Secondary | ICD-10-CM | POA: Insufficient documentation

## 2018-01-24 DIAGNOSIS — Z7984 Long term (current) use of oral hypoglycemic drugs: Secondary | ICD-10-CM | POA: Insufficient documentation

## 2018-01-24 DIAGNOSIS — Z7982 Long term (current) use of aspirin: Secondary | ICD-10-CM | POA: Diagnosis not present

## 2018-01-24 DIAGNOSIS — Z7902 Long term (current) use of antithrombotics/antiplatelets: Secondary | ICD-10-CM | POA: Insufficient documentation

## 2018-01-24 DIAGNOSIS — I214 Non-ST elevation (NSTEMI) myocardial infarction: Secondary | ICD-10-CM

## 2018-01-24 NOTE — Progress Notes (Signed)
Daily Session Note  Patient Details  Name: Sherry Torres MRN: [id number] Date of Birth: 12-21-1960 Referring Provider:     Cardiac Rehab from 01/24/2018 in [facility]iac [facility]  Referring Provider  Kathlyn Sacramento MD [Attending: Dr. Harrell Gave End]      Encounter Date: 01/24/2018  Check In: Session Check In - 01/24/18 1153      Check-In   Supervising physician immediately available to respond to emergencies  See telemetry face sheet for immediately available ER MD    Location  [facility]-Cardiac & Pulmonary Rehab    Staff Present  Renita Papa, RN BSN;Jessica Luan Pulling, MA, RCEP, CCRP, Exercise Physiologist    Tobacco Cessation  No Change   Quit 01/10/18   Warm-up and Cool-down  Not performed (comment)   Med Review   Resistance Training Performed  Yes    VAD Patient?  No    PAD/SET Patient?  No      Pain Assessment   Currently in Pain?  No/denies        Exercise Prescription Changes - 01/24/18 1200      Response to Exercise   Blood Pressure (Admit)  124/66    Blood Pressure (Exercise)  146/74    Blood Pressure (Exit)  124/70    Heart Rate (Admit)  56 bpm    Heart Rate (Exercise)  90 bpm    Heart Rate (Exit)  58 bpm    Oxygen Saturation (Admit)  97 %    Oxygen Saturation (Exercise)  99 %    Rating of Perceived Exertion (Exercise)  12    Symptoms  chest tightness at end 1/10    Comments  walk test results       Social History   Tobacco Use  Smoking Status Former Smoker  . Types: Cigarettes  Smokeless Tobacco Never Used  Tobacco Comment   hasnt smoked in 1 wk    Goals Met:  Proper associated with RPD/PD & O2 Sat Exercise tolerated well Personal goals reviewed No report of cardiac concerns or symptoms Strength training completed today  Goals Unmet:  Not Applicable  Comments: Med Review completed   Dr. Emily Filbert is Medical Director for [place] and [facility].

## 2018-01-24 NOTE — Patient Instructions (Signed)
Patient Instructions  Patient Details  Name: Sherry Torres MRN: [id number] Date of Birth: May 19, 1960 Referring Provider:  Iran Ouch, MD  Below are your personal goals for exercise, nutrition, and risk factors. Our goal is to help you stay on track towards obtaining and maintaining these goals. We will be discussing your progress on these goals with you throughout the program.  Initial Exercise Prescription: Initial Exercise Prescription - 01/24/18 1200      Date of Initial Exercise RX and Referring Provider   Date  01/24/18    Referring Provider  Lorine Bears MD   Attending: Dr. Cristal Deer End     Treadmill   MPH  1.8    Grade  1    Minutes  15    METs  2.63      NuStep   Level  2    SPM  80    Minutes  15    METs  2.5      Recumbant Elliptical   Level  1    RPM  50    Minutes  15    METs  2.5      Prescription Details   Frequency (times per week)  3    Duration  Progress to 45 minutes of aerobic exercise without signs/symptoms of physical distress      Intensity   THRR 40-80% of Max Heartrate  99-142    Ratings of Perceived Exertion  11-13    Perceived Dyspnea  0-4      Progression   Progression  Continue to progress workloads to maintain intensity without signs/symptoms of physical distress.      Resistance Training   Training Prescription  Yes    Weight  3 lbs    Reps  10-15       Exercise Goals: Frequency: Be able to perform aerobic exercise two to three times per week in program working toward 2-5 days per week of home exercise.  Intensity: Work with a perceived exertion of 11 (fairly light) - 15 (hard) while following your exercise prescription.  We will make changes to your prescription with you as you progress through the program.   Duration: Be able to do 30 to 45 minutes of continuous aerobic exercise in addition to a 5 minute warm-up and a 5 minute cool-down routine.   Nutrition Goals: Your personal nutrition goals will be  established when you do your nutrition analysis with the dietician.  The following are general nutrition guidelines to follow: Cholesterol < 200mg /day Sodium < 1500mg /day Fiber: Women over 50 yrs - 21 grams per day  Personal Goals: Personal Goals and Risk Factors at Admission - 01/24/18 1203      Core Components/Risk Factors/Patient Goals on Admission    Weight Management  Yes;Obesity;Weight Loss    Intervention  Weight Management: Develop a combined nutrition and exercise program designed to reach desired caloric intake, while maintaining appropriate intake of nutrient and fiber, sodium and fats, and appropriate energy expenditure required for the weight goal.;Weight Management: Provide education and appropriate resources to help participant work on and attain dietary goals.;Weight Management/Obesity: Establish reasonable short term and long term weight goals.;Obesity: Provide education and appropriate resources to help participant work on and attain dietary goals.    Admit Weight  191 lb (86.6 kg)    Goal Weight: Short Term  187 lb (84.8 kg)    Goal Weight: Long Term  140 lb (63.5 kg)    Expected Outcomes  Short Term: Continue  to assess and modify interventions until short term weight is achieved;Long Term: Adherence to nutrition and physical activity/exercise program aimed toward attainment of established weight goal;Weight Loss: Understanding of general recommendations for a balanced deficit meal plan, which promotes 1-2 lb weight loss per week and includes a negative energy balance of [id number] kcal/d;Understanding recommendations for meals to include 15-35% energy as protein, 25-35% energy from fat, 35-60% energy from carbohydrates, less than 200mg  of dietary cholesterol, 20-35 gm of total fiber daily;Understanding of distribution of calorie intake throughout the day with the consumption of 4-5 meals/snacks    Tobacco Cessation  Yes   quit 01/10/18   Intervention  Assist the participant in  steps to quit. Provide individualized education and counseling about committing to Tobacco Cessation, relapse prevention, and pharmacological support that can be provided by physician.;Education officer, environmental, assist with locating and accessing local/national Quit Smoking programs, and support quit date choice.    Expected Outcomes  Short Term: Will quit all tobacco product use, adhering to prevention of relapse plan.;Long Term: Complete abstinence from all tobacco products for at least 12 months from quit date.    Diabetes  Yes    Intervention  Provide education about signs/symptoms and action to take for hypo/hyperglycemia.;Provide education about proper nutrition, including hydration, and aerobic/resistive exercise prescription along with prescribed medications to achieve blood glucose in normal ranges: Fasting glucose 65-99 mg/dL    Expected Outcomes  Short Term: Participant verbalizes understanding of the signs/symptoms and immediate care of hyper/hypoglycemia, proper foot care and importance of medication, aerobic/resistive exercise and nutrition plan for blood glucose control.;Long Term: Attainment of HbA1C < 7%.    Hypertension  Yes    Intervention  Provide education on lifestyle modifcations including regular physical activity/exercise, weight management, moderate sodium restriction and increased consumption of fresh fruit, vegetables, and low fat dairy, alcohol moderation, and smoking cessation.;Monitor prescription use compliance.    Expected Outcomes  Short Term: Continued assessment and intervention until BP is < 140/48mm HG in hypertensive participants. < 130/48mm HG in hypertensive participants with diabetes, heart failure or chronic kidney disease.;Long Term: Maintenance of blood pressure at goal levels.    Lipids  Yes    Intervention  Provide education and support for participant on nutrition & aerobic/resistive exercise along with prescribed medications to achieve LDL 70mg , HDL  >40mg .    Expected Outcomes  Short Term: Participant states understanding of desired cholesterol values and is compliant with medications prescribed. Participant is following exercise prescription and nutrition guidelines.;Long Term: Cholesterol controlled with medications as prescribed, with individualized exercise RX and with personalized nutrition plan. Value goals: LDL < 70mg , HDL > 40 mg.       Tobacco Use Initial Evaluation: Social History   Tobacco Use  Smoking Status Former Smoker  . Types: Cigarettes  Smokeless Tobacco Never Used  Tobacco Comment   hasnt smoked in 1 wk    Exercise Goals and Review: Exercise Goals    Row Name 01/24/18 1233             Exercise Goals   Increase Physical Activity  Yes       Intervention  Provide advice, education, support and counseling about physical activity/exercise needs.;Develop an individualized exercise prescription for aerobic and resistive training based on initial evaluation findings, risk stratification, comorbidities and participant's personal goals.       Expected Outcomes  Short Term: Attend rehab on a regular basis to increase amount of physical activity.;Long Term: Add in home exercise to make exercise  part of routine and to increase amount of physical activity.;Long Term: Exercising regularly at least 3-5 days a week.       Increase Strength and Stamina  Yes       Intervention  Provide advice, education, support and counseling about physical activity/exercise needs.;Develop an individualized exercise prescription for aerobic and resistive training based on initial evaluation findings, risk stratification, comorbidities and participant's personal goals.       Expected Outcomes  Short Term: Increase workloads from initial exercise prescription for resistance, speed, and METs.;Short Term: Perform resistance training exercises routinely during rehab and add in resistance training at home;Long Term: Improve cardiorespiratory fitness,  muscular endurance and strength as measured by increased METs and functional capacity ( )       Able to understand and use rate of perceived exertion (RPE) scale  Yes       Intervention  Provide education and explanation on how to use RPE scale       Expected Outcomes  Short Term: Able to use RPE daily in rehab to express subjective intensity level;Long Term:  Able to use RPE to guide intensity level when exercising independently       Knowledge and understanding of Target Heart Rate Range (THRR)  Yes       Intervention  Provide education and explanation of THRR including how the numbers were predicted and where they are located for reference       Expected Outcomes  Short Term: Able to state/look up THRR;Short Term: Able to use daily as guideline for intensity in rehab;Long Term: Able to use THRR to govern intensity when exercising independently       Able to check pulse independently  Yes       Intervention  Provide education and demonstration on how to check pulse in carotid and radial arteries.;Review the importance of being able to check your own pulse for safety during independent exercise       Expected Outcomes  Short Term: Able to explain why pulse checking is important during independent exercise;Long Term: Able to check pulse independently and accurately       Understanding of Exercise Prescription  Yes       Intervention  Provide education, explanation, and written materials on patient's individual exercise prescription       Expected Outcomes  Short Term: Able to explain program exercise prescription;Long Term: Able to explain home exercise prescription to exercise independently          Copy of goals given to participant.

## 2018-01-24 NOTE — Progress Notes (Signed)
Cardiac Individual Treatment Plan  Patient Details  Name: Sherry Torres MRN: [id number] Date of Birth: [birth year] Referring Provider:     Cardiac Rehab from 01/24/2018 in [facility] Cardiac and Pulmonary Rehab  Referring Provider  Kathlyn Sacramento MD [Attending: Dr. Harrell Gave End]      Initial Encounter Date:    Cardiac Rehab from 01/24/2018 in [facility] Cardiac and Pulmonary Rehab  Date  01/24/18      Visit Diagnosis: NSTEMI (non-ST elevated myocardial infarction) [facility])  Patient's Home Medications on Admission:  Current Outpatient Medications:  .  aspirin 81 MG chewable tablet, Chew 1 tablet (81 mg total) by mouth daily., Disp: 30 tablet, Rfl: 2 .  atorvastatin (LIPITOR) 80 MG tablet, Take 1 tablet (80 mg total) by mouth daily at 6 PM., Disp: 90 tablet, Rfl: 3 .  bisoprolol (ZEBETA) 5 MG tablet, Take 1 tablet (5 mg total) by mouth daily., Disp: 90 tablet, Rfl: 3 .  buPROPion (ZYBAN) 150 MG 12 hr tablet, Take 1 tablet (150 mg total) by mouth 2 (two) times daily., Disp: 60 tablet, Rfl: 5 .  clopidogrel (PLAVIX) 75 MG tablet, Take 1 tablet (75 mg total) by mouth daily with breakfast., Disp: 90 tablet, Rfl: 3 .  cyclobenzaprine (FLEXERIL) 5 MG tablet, Take 1 tablet (5 mg total) by mouth 3 (three) times daily as needed for muscle spasms., Disp: 30 tablet, Rfl: 3 .  hydrochlorothiazide (HYDRODIURIL) 12.5 MG tablet, Take 1 tablet (12.5 mg total) by mouth daily., Disp: 90 tablet, Rfl: 3 .  isosorbide mononitrate (IMDUR) 30 MG 24 hr tablet, Take 1 tablet (30 mg total) by mouth daily., Disp: 90 tablet, Rfl: 3 .  metFORMIN (GLUCOPHAGE) 500 MG tablet, Take 0.5 tablets (250 mg total) by mouth 2 (two) times daily with a meal., Disp: 60 tablet, Rfl: 3 .  nitroGLYCERIN (NITROSTAT) 0.4 MG SL tablet, Place 1 tablet (0.4 mg total) under the tongue every 5 (five) minutes as needed for chest pain., Disp: 30 tablet, Rfl: 2 .  olmesartan (BENICAR) 5 MG tablet, Take 1 tablet (5 mg total) by mouth daily., Disp: 30  tablet, Rfl: 3 .  VENTOLIN HFA 108 (90 Base) MCG/ACT inhaler, Inhale 1-2 puffs into the lungs daily., Disp: 18 Inhaler, Rfl: 2 .  diclofenac sodium (VOLTAREN) 1 % GEL, Apply 4 g topically 4 (four) times daily. (Patient not taking: Reported on 01/24/2018), Disp: 100 g, Rfl: 1 .  traZODone (DESYREL) 50 MG tablet, Take 0.5-1 tablets (25-50 mg total) by mouth at bedtime as needed for sleep. (Patient not taking: Reported on 01/13/2018), Disp: 30 tablet, Rfl: 3  Past Medical History: Past Medical History:  Diagnosis Date  . Anxiety   . Asthma   . CAD (coronary artery disease)    a. 12/2017 NSTEMI;  b. 12/2017 MV: basal and mid inflat ischemia; c. 12/2017 Cath: LM nl, LAD 30ost, D1 mild dzs, LCX 40ost, 95d, RCA 79m(nondominant). LCX felt to be culprit but only 1.573mvessel-->Med rx.  . Diastolic dysfunction    a. 12/2017 Echo: EF 55-60%, no rwma, Gr1 DD.  . Marland Kitchenypertension   . [place]  . Tobacco abuse   . Type 2 diabetes mellitus (HCC)     Tobacco Use: Social History   Tobacco Use  Smoking Status Former Smoker  . Types: Cigarettes  Smokeless Tobacco Never Used  Tobacco Comment   hasnt smoked in 1 wk    Labs: Recent Review [facility]  [facility] Cardiac and Pulmonary Rehab Latest Ref Rng & Units 06/03/2017 09/17/2017 01/05/2018 01/06/2018   Cholestrol 0 - 200 mg/dL 191 - 196 164   LDLCALC 0 - 99 mg/dL 118(H) - 113(H) 95   HDL >40 mg/dL 48 - 39(L) 33(L)   Trlycerides <150 mg/dL 127 - 220(H) 182(H)   Hemoglobin A1c - 7.0(H) 6.5 - -       Exercise Target Goals: Exercise Program Goal: Individual exercise prescription set using results from initial 6 min walk test and THRR while considering  patient's activity barriers and safety.   Exercise Prescription Goal: Initial exercise prescription builds to 30-45 minutes a day of aerobic activity, 2-3 days per week.  Home exercise guidelines will be given to patient during program  as part of exercise prescription that the participant will acknowledge.  Activity Barriers & Risk Stratification: Activity Barriers & Cardiac Risk Stratification - 01/24/18 1201      Activity Barriers & Cardiac Risk Stratification   Activity Barriers  Shortness of Breath;Chest Pain/Angina;Deconditioning;Muscular Weakness    Cardiac Risk Stratification  Moderate       6 Minute Walk: 6 Minute Walk    Row Name 01/24/18 1222         6 Minute Walk   Phase  Initial     Distance  1045 feet     Walk Time  6 minutes     # of Rest Breaks  0     MPH  1.98     METS  2.68     RPE  12     VO2 Peak  9.37     Symptoms  Yes (comment)     Comments  chest tightness at end 1/10     Resting HR  56 bpm     Resting BP  124/66     Resting Oxygen Saturation   97 %     Exercise Oxygen Saturation  during 6 min walk  99 %     Max Ex. HR  90 bpm     Max Ex. BP  146/74     2 Minute Post BP  124/70        Oxygen Initial Assessment:   Oxygen Re-Evaluation:   Oxygen Discharge (Final Oxygen Re-Evaluation):   Initial Exercise Prescription: Initial Exercise Prescription - 01/24/18 1200      Date of Initial Exercise RX and Referring Provider   Date  01/24/18    Referring Provider  Kathlyn Sacramento MD   Attending: Dr. Harrell Gave End     Treadmill   MPH  1.8    Grade  1    Minutes  15    METs  2.63      NuStep   Level  2    SPM  80    Minutes  15    METs  2.5      Recumbant Elliptical   Level  1    RPM  50    Minutes  15    METs  2.5      Prescription Details   Frequency (times per week)  3    Duration  Progress to 45 minutes of aerobic exercise without signs/symptoms of physical distress      Intensity   THRR 40-80% of Max Heartrate  99-142    Ratings of Perceived Exertion  11-13    Perceived Dyspnea  0-4      Progression   Progression  Continue to progress workloads to maintain intensity without signs/symptoms  of physical distress.      Resistance Training   Training  Prescription  Yes    Weight  3 lbs    Reps  10-15       Perform Capillary Blood Glucose checks as needed.  Exercise Prescription Changes: Exercise Prescription Changes    Row Name 01/24/18 1200             Response to Exercise   Blood Pressure (Admit)  124/66       Blood Pressure (Exercise)  146/74       Blood Pressure (Exit)  124/70       Heart Rate (Admit)  56 bpm       Heart Rate (Exercise)  90 bpm       Heart Rate (Exit)  58 bpm       Oxygen Saturation (Admit)  97 %       Oxygen Saturation (Exercise)  99 %       Rating of Perceived Exertion (Exercise)  12       Symptoms  chest tightness at end 1/10       Comments  walk test results          Exercise Comments:   Exercise Goals and Review: Exercise Goals    Row Name 01/24/18 1233             Exercise Goals   Increase Physical Activity  Yes       Intervention  Provide advice, education, support and counseling about physical activity/exercise needs.;Develop an individualized exercise prescription for aerobic and resistive training based on initial evaluation findings, risk stratification, comorbidities and participant's personal goals.       Expected Outcomes  Short Term: Attend rehab on a regular basis to increase amount of physical activity.;Long Term: Add in home exercise to make exercise part of routine and to increase amount of physical activity.;Long Term: Exercising regularly at least 3-5 days a week.       Increase Strength and Stamina  Yes       Intervention  Provide advice, education, support and counseling about physical activity/exercise needs.;Develop an individualized exercise prescription for aerobic and resistive training based on initial evaluation findings, risk stratification, comorbidities and participant's personal goals.       Expected Outcomes  Short Term: Increase workloads from initial exercise prescription for resistance, speed, and METs.;Short Term: Perform resistance training exercises  routinely during rehab and add in resistance training at home;Long Term: Improve cardiorespiratory fitness, muscular endurance and strength as measured by increased METs and functional capacity (6MWT)       Able to understand and use rate of perceived exertion (RPE) scale  Yes       Intervention  Provide education and explanation on how to use RPE scale       Expected Outcomes  Short Term: Able to use RPE daily in rehab to express subjective intensity level;Long Term:  Able to use RPE to guide intensity level when exercising independently       Knowledge and understanding of Target Heart Rate Range (THRR)  Yes       Intervention  Provide education and explanation of THRR including how the numbers were predicted and where they are located for reference       Expected Outcomes  Short Term: Able to state/look up THRR;Short Term: Able to use daily as guideline for intensity in rehab;Long Term: Able to use THRR to govern intensity when exercising independently  Able to check pulse independently  Yes       Intervention  Provide education and demonstration on how to check pulse in carotid and radial arteries.;Review the importance of being able to check your own pulse for safety during independent exercise       Expected Outcomes  Short Term: Able to explain why pulse checking is important during independent exercise;Long Term: Able to check pulse independently and accurately       Understanding of Exercise Prescription  Yes       Intervention  Provide education, explanation, and written materials on patient's individual exercise prescription       Expected Outcomes  Short Term: Able to explain program exercise prescription;Long Term: Able to explain home exercise prescription to exercise independently          Exercise Goals Re-Evaluation :   Discharge Exercise Prescription (Final Exercise Prescription Changes): Exercise Prescription Changes - 01/24/18 1200      Response to Exercise   Blood  Pressure (Admit)  124/66    Blood Pressure (Exercise)  146/74    Blood Pressure (Exit)  124/70    Heart Rate (Admit)  56 bpm    Heart Rate (Exercise)  90 bpm    Heart Rate (Exit)  58 bpm    Oxygen Saturation (Admit)  97 %    Oxygen Saturation (Exercise)  99 %    Rating of Perceived Exertion (Exercise)  12    Symptoms  chest tightness at end 1/10    Comments  walk test results       Nutrition:  Target Goals: Understanding of nutrition guidelines, daily intake of sodium <1533m, cholesterol <2077m calories 30% from fat and 7% or less from saturated fats, daily to have 5 or more servings of fruits and vegetables.  Biometrics: Pre Biometrics - 01/24/18 1233      Pre Biometrics   Height  5' 1.8" (1.57 m)    Weight  192 lb 1.6 oz (87.1 kg)    Waist Circumference  40 inches    Hip Circumference  45.5 inches    Waist to Hip Ratio  0.88 %    BMI (Calculated)  35.35    Single Leg Stand  16.86 seconds        Nutrition Therapy Plan and Nutrition Goals: Nutrition Therapy & Goals - 01/24/18 1213      Intervention Plan   Intervention  Prescribe, educate and counsel regarding individualized specific dietary modifications aiming towards targeted core components such as weight, hypertension, lipid management, diabetes, heart failure and other comorbidities.;Nutrition handout(s) given to patient.    Expected Outcomes  Short Term Goal: Understand basic principles of dietary content, such as calories, fat, sodium, cholesterol and nutrients.;Long Term Goal: Adherence to prescribed nutrition plan.;Short Term Goal: A plan has been developed with personal nutrition goals set during dietitian appointment.       Nutrition Assessments: Nutrition Assessments - 01/24/18 1213      MEDFICTS Scores   Pre Score  111       Nutrition Goals Re-Evaluation:   Nutrition Goals Discharge (Final Nutrition Goals Re-Evaluation):   Psychosocial: Target Goals: Acknowledge presence or absence of significant  depression and/or stress, maximize coping skills, provide positive support system. Participant is able to verbalize types and ability to use techniques and skills needed for reducing stress and depression.   Initial Review & Psychosocial Screening: Initial Psych Review & Screening - 01/24/18 1208      Initial Review   Current issues  with  Current Sleep Concerns;Current Anxiety/Panic;Current Stress Concerns    Source of Stress Concerns  Unable to participate in former interests or hobbies;Unable to perform yard/household activities    Comments  Currently waking up with chest discomfort/ questionable acid reflux during the night. It is scary for her. Sometimes she takes Nitro, other times she just waits. She is not back at work yet. She works at a [employer].       Family Dynamics   Good Support System?  Yes   spouse, family, friends     Barriers   Psychosocial barriers to participate in program  There are no identifiable barriers or psychosocial needs.;The patient should benefit from training in stress management and relaxation.      Screening Interventions   Interventions  Encouraged to exercise;Program counselor consult;To provide support and resources with identified psychosocial needs;Provide feedback about the scores to participant    Expected Outcomes  Short Term goal: Utilizing psychosocial counselor, staff and physician to assist with identification of specific Stressors or current issues interfering with healing process. Setting desired goal for each stressor or current issue identified.;Long Term Goal: Stressors or current issues are controlled or eliminated.;Short Term goal: Identification and review with participant of any Quality of Life or Depression concerns found by scoring the questionnaire.;Long Term goal: The participant improves quality of Life and PHQ9 Scores as seen by post scores and/or verbalization of changes       Quality of Life Scores:  Quality of Life - 01/24/18  1209      Quality of Life   Select  Quality of Life      Quality of Life Scores   Health/Function Pre  20 %    Socioeconomic Pre  24 %    Psych/Spiritual Pre  22.29 %    Family Pre  20.4 %    GLOBAL Pre  21.35 %      Scores of 19 and below usually indicate a poorer quality of life in these areas.  A difference of  2-3 points is a clinically meaningful difference.  A difference of 2-3 points in the total score of the Quality of Life Index has been associated with significant improvement in overall quality of life, self-image, physical symptoms, and general health in studies assessing change in quality of life.  PHQ-9: Recent Review Flowsheet Data    Depression screen [facility] 2/9 01/24/2018 01/11/2018 01/03/2018 09/17/2017 06/11/2017   Decreased Interest 2 0 0 0 0   Down, Depressed, Hopeless 1 0 0 0 0   PHQ - 2 Score 3 0 0 0 0   Altered sleeping 3 - - - -   Tired, decreased energy 1 - - - -   Change in appetite 1 - - - -   Feeling bad or failure about yourself  0 - - - -   Trouble concentrating 0 - - - -   Moving slowly or fidgety/restless 0 - - - -   Suicidal thoughts 0 - - - -   PHQ-9 Score 8 - - - -   Difficult doing work/chores Somewhat difficult - - - -     Interpretation of Total Score  Total Score Depression Severity:  1-4 = Minimal depression, 5-9 = Mild depression, 10-14 = Moderate depression, 15-19 = Moderately severe depression, 20-27 = Severe depression   Psychosocial Evaluation and Intervention:   Psychosocial Re-Evaluation:   Psychosocial Discharge (Final Psychosocial Re-Evaluation):   Vocational Rehabilitation: Provide vocational rehab assistance to qualifying candidates.  Vocational Rehab Evaluation & Intervention: Vocational Rehab - 01/24/18 1207      Initial Vocational Rehab Evaluation & Intervention   Assessment shows need for Vocational Rehabilitation  No       Education: Education Goals: Education classes will be provided on a variety of topics geared  toward better understanding of heart health and risk factor modification. Participant will state understanding/return demonstration of topics presented as noted by education test scores.  Learning Barriers/Preferences: Learning Barriers/Preferences - 01/24/18 1205      Learning Barriers/Preferences   Learning Barriers  Exercise Concerns    Learning Preferences  None       Education Topics:  AED/CPR: - Group verbal and written instruction with the use of models to demonstrate the basic use of the AED with the basic ABC's of resuscitation.   General Nutrition Guidelines/Fats and Fiber: -Group instruction provided by verbal, written material, models and posters to present the general guidelines for heart healthy nutrition. Gives an explanation and review of dietary fats and fiber.   Controlling Sodium/Reading Food Labels: -Group verbal and written material supporting the discussion of sodium use in heart healthy nutrition. Review and explanation with models, verbal and written materials for utilization of the food label.   Exercise Physiology & General Exercise Guidelines: - Group verbal and written instruction with models to review the exercise physiology of the cardiovascular system and associated critical values. Provides general exercise guidelines with specific guidelines to those with heart or lung disease.    Aerobic Exercise & Resistance Training: - Gives group verbal and written instruction on the various components of exercise. Focuses on aerobic and resistive training programs and the benefits of this training and how to safely progress through these programs..   Flexibility, Balance, Mind/Body Relaxation: Provides group verbal/written instruction on the benefits of flexibility and balance training, including mind/body exercise modes such as yoga, pilates and tai chi.  Demonstration and skill practice provided.   Stress and Anxiety: - Provides group verbal and written  instruction about the health risks of elevated stress and causes of high stress.  Discuss the correlation between heart/lung disease and anxiety and treatment options. Review healthy ways to manage with stress and anxiety.   Depression: - Provides group verbal and written instruction on the correlation between heart/lung disease and depressed mood, treatment options, and the stigmas associated with seeking treatment.   Anatomy & Physiology of the Heart: - Group verbal and written instruction and models provide basic cardiac anatomy and physiology, with the coronary electrical and arterial systems. Review of Valvular disease and Heart Failure   Cardiac Procedures: - Group verbal and written instruction to review commonly prescribed medications for heart disease. Reviews the medication, class of the drug, and side effects. Includes the steps to properly store meds and maintain the prescription regimen. (beta blockers and nitrates)   Cardiac Medications I: - Group verbal and written instruction to review commonly prescribed medications for heart disease. Reviews the medication, class of the drug, and side effects. Includes the steps to properly store meds and maintain the prescription regimen.   Cardiac Medications II: -Group verbal and written instruction to review commonly prescribed medications for heart disease. Reviews the medication, class of the drug, and side effects. (all other drug classes)    Go Sex-Intimacy & Heart Disease, Get SMART - Goal Setting: - Group verbal and written instruction through game format to discuss heart disease and the return to sexual intimacy. Provides group verbal and written material to discuss and  apply goal setting through the application of the S.M.A.R.T. Method.   Other Matters of the Heart: - Provides group verbal, written materials and models to describe Stable Angina and Peripheral Artery. Includes description of the disease process and treatment  options available to the cardiac patient.   Exercise & Equipment Safety: - Individual verbal instruction and demonstration of equipment use and safety with use of the equipment.   Cardiac Rehab from 01/24/2018 in [facility] Cardiac and Pulmonary Rehab  Date  01/24/18  Educator  [facility]  Instruction Review Code  1- Verbalizes Understanding      Infection Prevention: - Provides verbal and written material to individual with discussion of infection control including proper hand washing and proper equipment cleaning during exercise session.   Cardiac Rehab from 01/24/2018 in [facility] Cardiac and Pulmonary Rehab  Date  01/24/18  Educator  [facility]  Instruction Review Code  1- Verbalizes Understanding      Falls Prevention: - Provides verbal and written material to individual with discussion of falls prevention and safety.   Cardiac Rehab from 01/24/2018 in [facility] Cardiac and Pulmonary Rehab  Date  01/24/18  Educator  [facility]  Instruction Review Code  1- Verbalizes Understanding      Diabetes: - Individual verbal and written instruction to review signs/symptoms of diabetes, desired ranges of glucose level fasting, after meals and with exercise. Acknowledge that pre and post exercise glucose checks will be done for 3 sessions at entry of program.   Cardiac Rehab from 01/24/2018 in [facility] Cardiac and Pulmonary Rehab  Date  01/24/18  Educator  [facility]  Instruction Review Code  1- Verbalizes Understanding      Know Your Numbers and Risk Factors: -Group verbal and written instruction about important numbers in your health.  Discussion of what are risk factors and how they play a role in the disease process.  Review of Cholesterol, Blood Pressure, Diabetes, and BMI and the role they play in your overall health.   Sleep Hygiene: -Provides group verbal and written instruction about how sleep can affect your health.  Define sleep hygiene, discuss sleep cycles and impact of sleep habits. Review good sleep hygiene tips.     Other: -Provides group and verbal instruction on various topics (see comments)   Knowledge Questionnaire Score: Knowledge Questionnaire Score - 01/24/18 1205      Knowledge Questionnaire Score   Pre Score  22/26   correct answers reviewed with Clarene Critchley, focus on exercise and nutrition      Core Components/Risk Factors/Patient Goals at Admission: Personal Goals and Risk Factors at Admission - 01/24/18 1203      Core Components/Risk Factors/Patient Goals on Admission    Weight Management  Yes;Obesity;Weight Loss    Intervention  Weight Management: Develop a combined nutrition and exercise program designed to reach desired caloric intake, while maintaining appropriate intake of nutrient and fiber, sodium and fats, and appropriate energy expenditure required for the weight goal.;Weight Management: Provide education and appropriate resources to help participant work on and attain dietary goals.;Weight Management/Obesity: Establish reasonable short term and long term weight goals.;Obesity: Provide education and appropriate resources to help participant work on and attain dietary goals.    Admit Weight  191 lb (86.6 kg)    Goal Weight: Short Term  187 lb (84.8 kg)    Goal Weight: Long Term  140 lb (63.5 kg)    Expected Outcomes  Short Term: Continue to assess and modify interventions until short term weight is achieved;Long Term: Adherence to nutrition  and physical activity/exercise program aimed toward attainment of established weight goal;Weight Loss: Understanding of general recommendations for a balanced deficit meal plan, which promotes 1-2 lb weight loss per week and includes a negative energy balance of [phone] kcal/d;Understanding recommendations for meals to include 15-35% energy as protein, 25-35% energy from fat, 35-60% energy from carbohydrates, less than 229m of dietary cholesterol, 20-35 gm of total fiber daily;Understanding of distribution of calorie intake throughout the day  with the consumption of 4-5 meals/snacks    Tobacco Cessation  Yes   quit 01/10/18   Intervention  Assist the participant in steps to quit. Provide individualized education and counseling about committing to Tobacco Cessation, relapse prevention, and pharmacological support that can be provided by physician.;OAdvice worker assist with locating and accessing local/national Quit Smoking programs, and support quit date choice.    Expected Outcomes  Short Term: Will quit all tobacco product use, adhering to prevention of relapse plan.;Long Term: Complete abstinence from all tobacco products for at least 12 months from quit date.    Diabetes  Yes    Intervention  Provide education about signs/symptoms and action to take for hypo/hyperglycemia.;Provide education about proper nutrition, including hydration, and aerobic/resistive exercise prescription along with prescribed medications to achieve blood glucose in normal ranges: Fasting glucose 65-99 mg/dL    Expected Outcomes  Short Term: Participant verbalizes understanding of the signs/symptoms and immediate care of hyper/hypoglycemia, proper foot care and importance of medication, aerobic/resistive exercise and nutrition plan for blood glucose control.;Long Term: Attainment of HbA1C < 7%.    Hypertension  Yes    Intervention  Provide education on lifestyle modifcations including regular physical activity/exercise, weight management, moderate sodium restriction and increased consumption of fresh fruit, vegetables, and low fat dairy, alcohol moderation, and smoking cessation.;Monitor prescription use compliance.    Expected Outcomes  Short Term: Continued assessment and intervention until BP is < 140/971mHG in hypertensive participants. < 130/802mG in hypertensive participants with diabetes, heart failure or chronic kidney disease.;Long Term: Maintenance of blood pressure at goal levels.    Lipids  Yes    Intervention  Provide education and  support for participant on nutrition & aerobic/resistive exercise along with prescribed medications to achieve LDL <61m58mDL >40mg2m Expected Outcomes  Short Term: Participant states understanding of desired cholesterol values and is compliant with medications prescribed. Participant is following exercise prescription and nutrition guidelines.;Long Term: Cholesterol controlled with medications as prescribed, with individualized exercise RX and with personalized nutrition plan. Value goals: LDL < 61mg,73m > 40 mg.       Core Components/Risk Factors/Patient Goals Review:    Core Components/Risk Factors/Patient Goals at Discharge (Final Review):    ITP Comments: ITP Comments    Row Name 01/24/18 1155           ITP Comments  Med Review completed. Initial ITP created. Diagnosis can be found in [facility]         Comments: Initial ITP

## 2018-01-26 ENCOUNTER — Encounter: Payer: Self-pay | Admitting: *Deleted

## 2018-01-26 DIAGNOSIS — I214 Non-ST elevation (NSTEMI) myocardial infarction: Secondary | ICD-10-CM

## 2018-01-26 LAB — GLUCOSE, CAPILLARY
GLUCOSE-CAPILLARY: 95 mg/dL (ref 70–99)
Glucose-Capillary: 90 mg/dL (ref 70–99)

## 2018-01-26 NOTE — Progress Notes (Signed)
Cardiac Individual Treatment Plan  Patient Details  Name: Sherry Torres MRN: [id number] Date of Birth: 02-22-1961 Referring Provider:     Cardiac Rehab from 01/24/2018 in [facility] Cardiac and Pulmonary Rehab  Referring Provider  Kathlyn Sacramento MD [Attending: Dr. Harrell Gave End]      Initial Encounter Date:    Cardiac Rehab from 01/24/2018 in [facility] Cardiac and Pulmonary Rehab  Date  01/24/18      Visit Diagnosis: NSTEMI (non-ST elevated myocardial infarction) [facility])  Patient's Home Medications on Admission:  Current Outpatient Medications:  .  aspirin 81 MG chewable tablet, Chew 1 tablet (81 mg total) by mouth daily., Disp: 30 tablet, Rfl: 2 .  atorvastatin (LIPITOR) 80 MG tablet, Take 1 tablet (80 mg total) by mouth daily at 6 PM., Disp: 90 tablet, Rfl: 3 .  bisoprolol (ZEBETA) 5 MG tablet, Take 1 tablet (5 mg total) by mouth daily., Disp: 90 tablet, Rfl: 3 .  buPROPion (ZYBAN) 150 MG 12 hr tablet, Take 1 tablet (150 mg total) by mouth 2 (two) times daily., Disp: 60 tablet, Rfl: 5 .  clopidogrel (PLAVIX) 75 MG tablet, Take 1 tablet (75 mg total) by mouth daily with breakfast., Disp: 90 tablet, Rfl: 3 .  cyclobenzaprine (FLEXERIL) 5 MG tablet, Take 1 tablet (5 mg total) by mouth 3 (three) times daily as needed for muscle spasms., Disp: 30 tablet, Rfl: 3 .  diclofenac sodium (VOLTAREN) 1 % GEL, Apply 4 g topically 4 (four) times daily. (Patient not taking: Reported on 01/24/2018), Disp: 100 g, Rfl: 1 .  hydrochlorothiazide (HYDRODIURIL) 12.5 MG tablet, Take 1 tablet (12.5 mg total) by mouth daily., Disp: 90 tablet, Rfl: 3 .  isosorbide mononitrate (IMDUR) 30 MG 24 hr tablet, Take 1 tablet (30 mg total) by mouth daily., Disp: 90 tablet, Rfl: 3 .  metFORMIN (GLUCOPHAGE) 500 MG tablet, Take 0.5 tablets (250 mg total) by mouth 2 (two) times daily with a meal., Disp: 60 tablet, Rfl: 3 .  nitroGLYCERIN (NITROSTAT) 0.4 MG SL tablet, Place 1 tablet (0.4 mg total) under the tongue every 5 (five)  minutes as needed for chest pain., Disp: 30 tablet, Rfl: 2 .  olmesartan (BENICAR) 5 MG tablet, Take 1 tablet (5 mg total) by mouth daily., Disp: 30 tablet, Rfl: 3 .  traZODone (DESYREL) 50 MG tablet, Take 0.5-1 tablets (25-50 mg total) by mouth at bedtime as needed for sleep. (Patient not taking: Reported on 01/13/2018), Disp: 30 tablet, Rfl: 3 .  VENTOLIN HFA 108 (90 Base) MCG/ACT inhaler, Inhale 1-2 puffs into the lungs daily., Disp: 18 Inhaler, Rfl: 2  Past Medical History: Past Medical History:  Diagnosis Date  . Anxiety   . Asthma   . CAD (coronary artery disease)    a. 12/2017 NSTEMI;  b. 12/2017 MV: basal and mid inflat ischemia; c. 12/2017 Cath: LM nl, LAD 30ost, D1 mild dzs, LCX 40ost, 95d, RCA 46m(nondominant). LCX felt to be culprit but only 1.543mvessel-->Med rx.  . Diastolic dysfunction    a. 12/2017 Echo: EF 55-60%, no rwma, Gr1 DD.  . Marland Kitchenypertension   . Recurrent major depressive disorder, [place] partial [place]  . Tobacco abuse   . Type 2 diabetes mellitus (HCC)     Tobacco Use: Social History   Tobacco Use  Smoking Status Former Smoker  . Types: Cigarettes  Smokeless Tobacco Never Used  Tobacco Comment   hasnt smoked in 1 wk    Labs: Recent Review [facility] Cardiac and Pulmonary Rehab Latest Ref Rng & Units 06/03/2017 09/17/2017 01/05/2018 01/06/2018   Cholestrol 0 - 200 mg/dL 191 - 196 164   LDLCALC 0 - 99 mg/dL 118(H) - 113(H) 95   HDL >40 mg/dL 48 - 39(L) 33(L)   Trlycerides <150 mg/dL 127 - 220(H) 182(H)   Hemoglobin A1c - 7.0(H) 6.5 - -       Exercise Target Goals: Exercise Program Goal: Individual exercise prescription set using results from initial 6 min walk test and THRR while considering  patient's activity barriers and safety.   Exercise Prescription Goal: Initial exercise prescription builds to 30-45 minutes a day of aerobic activity, 2-3 days per week.  Home exercise guidelines will be given to patient during  program as part of exercise prescription that the participant will acknowledge.  Activity Barriers & Risk Stratification: Activity Barriers & Cardiac Risk Stratification - 01/24/18 1201      Activity Barriers & Cardiac Risk Stratification   Activity Barriers  Shortness of Breath;Chest Pain/Angina;Deconditioning;Muscular Weakness    Cardiac Risk Stratification  Moderate       6 Minute Walk: 6 Minute Walk    Row Name 01/24/18 1222         6 Minute Walk   Phase  Initial     Distance  1045 feet     Walk Time  6 minutes     # of Rest Breaks  0     MPH  1.98     METS  2.68     RPE  12     VO2 Peak  9.37     Symptoms  Yes (comment)     Comments  chest tightness at end 1/10     Resting HR  56 bpm     Resting BP  124/66     Resting Oxygen Saturation   97 %     Exercise Oxygen Saturation  during 6 min walk  99 %     Max Ex. HR  90 bpm     Max Ex. BP  146/74     2 Minute Post BP  124/70        Oxygen Initial Assessment:   Oxygen Re-Evaluation:   Oxygen Discharge (Final Oxygen Re-Evaluation):   Initial Exercise Prescription: Initial Exercise Prescription - 01/24/18 1200      Date of Initial Exercise RX and Referring Provider   Date  01/24/18    Referring Provider  Kathlyn Sacramento MD   Attending: Dr. Harrell Gave End     Treadmill   MPH  1.8    Grade  1    Minutes  15    METs  2.63      NuStep   Level  2    SPM  80    Minutes  15    METs  2.5      Recumbant Elliptical   Level  1    RPM  50    Minutes  15    METs  2.5      Prescription Details   Frequency (times per week)  3    Duration  Progress to 45 minutes of aerobic exercise without signs/symptoms of physical distress      Intensity   THRR 40-80% of Max Heartrate  99-142    Ratings of Perceived Exertion  11-13    Perceived Dyspnea  0-4      Progression   Progression  Continue to progress workloads to maintain intensity without signs/symptoms  of physical distress.      Resistance Training    Training Prescription  Yes    Weight  3 lbs    Reps  10-15       Perform Capillary Blood Glucose checks as needed.  Exercise Prescription Changes: Exercise Prescription Changes    Row Name 01/24/18 1200             Response to Exercise   Blood Pressure (Admit)  124/66       Blood Pressure (Exercise)  146/74       Blood Pressure (Exit)  124/70       Heart Rate (Admit)  56 bpm       Heart Rate (Exercise)  90 bpm       Heart Rate (Exit)  58 bpm       Oxygen Saturation (Admit)  97 %       Oxygen Saturation (Exercise)  99 %       Rating of Perceived Exertion (Exercise)  12       Symptoms  chest tightness at end 1/10       Comments  walk test results          Exercise Comments:   Exercise Goals and Review: Exercise Goals    Row Name 01/24/18 1233             Exercise Goals   Increase Physical Activity  Yes       Intervention  Provide advice, education, support and counseling about physical activity/exercise needs.;Develop an individualized exercise prescription for aerobic and resistive training based on initial evaluation findings, risk stratification, comorbidities and participant's personal goals.       Expected Outcomes  Short Term: Attend rehab on a regular basis to increase amount of physical activity.;Long Term: Add in home exercise to make exercise part of routine and to increase amount of physical activity.;Long Term: Exercising regularly at least 3-5 days a week.       Increase Strength and Stamina  Yes       Intervention  Provide advice, education, support and counseling about physical activity/exercise needs.;Develop an individualized exercise prescription for aerobic and resistive training based on initial evaluation findings, risk stratification, comorbidities and participant's personal goals.       Expected Outcomes  Short Term: Increase workloads from initial exercise prescription for resistance, speed, and METs.;Short Term: Perform resistance training  exercises routinely during rehab and add in resistance training at home;Long Term: Improve cardiorespiratory fitness, muscular endurance and strength as measured by increased METs and functional capacity (6MWT)       Able to understand and use rate of perceived exertion (RPE) scale  Yes       Intervention  Provide education and explanation on how to use RPE scale       Expected Outcomes  Short Term: Able to use RPE daily in rehab to express subjective intensity level;Long Term:  Able to use RPE to guide intensity level when exercising independently       Knowledge and understanding of Target Heart Rate Range (THRR)  Yes       Intervention  Provide education and explanation of THRR including how the numbers were predicted and where they are located for reference       Expected Outcomes  Short Term: Able to state/look up THRR;Short Term: Able to use daily as guideline for intensity in rehab;Long Term: Able to use THRR to govern intensity when exercising independently  Able to check pulse independently  Yes       Intervention  Provide education and demonstration on how to check pulse in carotid and radial arteries.;Review the importance of being able to check your own pulse for safety during independent exercise       Expected Outcomes  Short Term: Able to explain why pulse checking is important during independent exercise;Long Term: Able to check pulse independently and accurately       Understanding of Exercise Prescription  Yes       Intervention  Provide education, explanation, and written materials on patient's individual exercise prescription       Expected Outcomes  Short Term: Able to explain program exercise prescription;Long Term: Able to explain home exercise prescription to exercise independently          Exercise Goals Re-Evaluation :   Discharge Exercise Prescription (Final Exercise Prescription Changes): Exercise Prescription Changes - 01/24/18 1200      Response to Exercise    Blood Pressure (Admit)  124/66    Blood Pressure (Exercise)  146/74    Blood Pressure (Exit)  124/70    Heart Rate (Admit)  56 bpm    Heart Rate (Exercise)  90 bpm    Heart Rate (Exit)  58 bpm    Oxygen Saturation (Admit)  97 %    Oxygen Saturation (Exercise)  99 %    Rating of Perceived Exertion (Exercise)  12    Symptoms  chest tightness at end 1/10    Comments  walk test results       Nutrition:  Target Goals: Understanding of nutrition guidelines, daily intake of sodium '1500mg'$ , cholesterol '200mg'$ , calories 30% from fat and 7% or less from saturated fats, daily to have 5 or more servings of fruits and vegetables.  Biometrics: Pre Biometrics - 01/24/18 1233      Pre Biometrics   Height  5' 1.8" (1.57 m)    Weight  192 lb 1.6 oz (87.1 kg)    Waist Circumference  40 inches    Hip Circumference  45.5 inches    Waist to Hip Ratio  0.88 %    BMI (Calculated)  35.35    Single Leg Stand  16.86 seconds        Nutrition Therapy Plan and Nutrition Goals: Nutrition Therapy & Goals - 01/24/18 1213      Intervention Plan   Intervention  Prescribe, educate and counsel regarding individualized specific dietary modifications aiming towards targeted core components such as weight, hypertension, lipid management, diabetes, heart failure and other comorbidities.;Nutrition handout(s) given to patient.    Expected Outcomes  Short Term Goal: Understand basic principles of dietary content, such as calories, fat, sodium, cholesterol and nutrients.;Long Term Goal: Adherence to prescribed nutrition plan.;Short Term Goal: A plan has been developed with personal nutrition goals set during dietitian appointment.       Nutrition Assessments: Nutrition Assessments - 01/24/18 1213      MEDFICTS Scores   Pre Score  111       Nutrition Goals Re-Evaluation:   Nutrition Goals Discharge (Final Nutrition Goals Re-Evaluation):   Psychosocial: Target Goals: Acknowledge presence or absence of  significant depression and/or stress, maximize coping skills, provide positive support system. Participant is able to verbalize types and ability to use techniques and skills needed for reducing stress and depression.   Initial Review & Psychosocial Screening: Initial Psych Review & Screening - 01/24/18 1208      Initial Review   Current issues  with  Current Sleep Concerns;Current Anxiety/Panic;Current Stress Concerns    Source of Stress Concerns  Unable to participate in former interests or hobbies;Unable to perform yard/household activities    Comments  Currently waking up with chest discomfort/ questionable acid reflux during the night. It is scary for her. Sometimes she takes Nitro, other times she just waits. She is not back at work yet. She works at a [employer].       Family Dynamics   Good Support System?  Yes   spouse, family, friends     Barriers   Psychosocial barriers to participate in program  There are no identifiable barriers or psychosocial needs.;The patient should benefit from training in stress management and relaxation.      Screening Interventions   Interventions  Encouraged to exercise;Program counselor consult;To provide support and resources with identified psychosocial needs;Provide feedback about the scores to participant    Expected Outcomes  Short Term goal: Utilizing psychosocial counselor, staff and physician to assist with identification of specific Stressors or current issues interfering with healing process. Setting desired goal for each stressor or current issue identified.;Long Term Goal: Stressors or current issues are controlled or eliminated.;Short Term goal: Identification and review with participant of any Quality of Life or Depression concerns found by scoring the questionnaire.;Long Term goal: The participant improves quality of Life and PHQ9 Scores as seen by post scores and/or verbalization of changes       Quality of Life Scores:  Quality of Life  - 01/24/18 1209      Quality of Life   Select  Quality of Life      Quality of Life Scores   Health/Function Pre  20 %    Socioeconomic Pre  24 %    Psych/Spiritual Pre  22.29 %    Family Pre  20.4 %    GLOBAL Pre  21.35 %      Scores of 19 and below usually indicate a poorer quality of life in these areas.  A difference of  2-3 points is a clinically meaningful difference.  A difference of 2-3 points in the total score of the Quality of Life Index has been associated with significant improvement in overall quality of life, self-image, physical symptoms, and general health in studies assessing change in quality of life.  PHQ-9: Recent Review Flowsheet Data    Depression screen [facility] 2/9 01/24/2018 01/11/2018 01/03/2018 09/17/2017 06/11/2017   Decreased Interest 2 0 0 0 0   Down, Depressed, Hopeless 1 0 0 0 0   PHQ - 2 Score 3 0 0 0 0   Altered sleeping 3 - - - -   Tired, decreased energy 1 - - - -   Change in appetite 1 - - - -   Feeling bad or failure about yourself  0 - - - -   Trouble concentrating 0 - - - -   Moving slowly or fidgety/restless 0 - - - -   Suicidal thoughts 0 - - - -   PHQ-9 Score 8 - - - -   Difficult doing work/chores Somewhat difficult - - - -     Interpretation of Total Score  Total Score Depression Severity:  1-4 = Minimal depression, 5-9 = Mild depression, 10-14 = Moderate depression, 15-19 = Moderately severe depression, 20-27 = Severe depression   Psychosocial Evaluation and Intervention:   Psychosocial Re-Evaluation:   Psychosocial Discharge (Final Psychosocial Re-Evaluation):   Vocational Rehabilitation: Provide vocational rehab assistance to qualifying candidates.  Vocational Rehab Evaluation & Intervention: Vocational Rehab - 01/24/18 1207      Initial Vocational Rehab Evaluation & Intervention   Assessment shows need for Vocational Rehabilitation  No       Education: Education Goals: Education classes will be provided on a variety of  topics geared toward better understanding of heart health and risk factor modification. Participant will state understanding/return demonstration of topics presented as noted by education test scores.  Learning Barriers/Preferences: Learning Barriers/Preferences - 01/24/18 1205      Learning Barriers/Preferences   Learning Barriers  Exercise Concerns    Learning Preferences  None       Education Topics:  AED/CPR: - Group verbal and written instruction with the use of models to demonstrate the basic use of the AED with the basic ABC's of resuscitation.   General Nutrition Guidelines/Fats and Fiber: -Group instruction provided by verbal, written material, models and posters to present the general guidelines for heart healthy nutrition. Gives an explanation and review of dietary fats and fiber.   Controlling Sodium/Reading Food Labels: -Group verbal and written material supporting the discussion of sodium use in heart healthy nutrition. Review and explanation with models, verbal and written materials for utilization of the food label.   Exercise Physiology & General Exercise Guidelines: - Group verbal and written instruction with models to review the exercise physiology of the cardiovascular system and associated critical values. Provides general exercise guidelines with specific guidelines to those with heart or lung disease.    Aerobic Exercise & Resistance Training: - Gives group verbal and written instruction on the various components of exercise. Focuses on aerobic and resistive training programs and the benefits of this training and how to safely progress through these programs..   Flexibility, Balance, Mind/Body Relaxation: Provides group verbal/written instruction on the benefits of flexibility and balance training, including mind/body exercise modes such as yoga, pilates and tai chi.  Demonstration and skill practice provided.   Stress and Anxiety: - Provides group verbal  and written instruction about the health risks of elevated stress and causes of high stress.  Discuss the correlation between heart/lung disease and anxiety and treatment options. Review healthy ways to manage with stress and anxiety.   Depression: - Provides group verbal and written instruction on the correlation between heart/lung disease and depressed mood, treatment options, and the stigmas associated with seeking treatment.   Anatomy & Physiology of the Heart: - Group verbal and written instruction and models provide basic cardiac anatomy and physiology, with the coronary electrical and arterial systems. Review of Valvular disease and Heart Failure   Cardiac Procedures: - Group verbal and written instruction to review commonly prescribed medications for heart disease. Reviews the medication, class of the drug, and side effects. Includes the steps to properly store meds and maintain the prescription regimen. (beta blockers and nitrates)   Cardiac Medications I: - Group verbal and written instruction to review commonly prescribed medications for heart disease. Reviews the medication, class of the drug, and side effects. Includes the steps to properly store meds and maintain the prescription regimen.   Cardiac Medications II: -Group verbal and written instruction to review commonly prescribed medications for heart disease. Reviews the medication, class of the drug, and side effects. (all other drug classes)    Go Sex-Intimacy & Heart Disease, Get SMART - Goal Setting: - Group verbal and written instruction through game format to discuss heart disease and the return to sexual intimacy. Provides group verbal and written material to discuss and  apply goal setting through the application of the S.M.A.R.T. Method.   Other Matters of the Heart: - Provides group verbal, written materials and models to describe Stable Angina and Peripheral Artery. Includes description of the disease process  and treatment options available to the cardiac patient.   Exercise & Equipment Safety: - Individual verbal instruction and demonstration of equipment use and safety with use of the equipment.   Cardiac Rehab from 01/24/2018 in [facility] Cardiac and Pulmonary Rehab  Date  01/24/18  Educator  [facility]  Instruction Review Code  1- Verbalizes Understanding      Infection Prevention: - Provides verbal and written material to individual with discussion of infection control including proper hand washing and proper equipment cleaning during exercise session.   Cardiac Rehab from 01/24/2018 in [facility] Cardiac and Pulmonary Rehab  Date  01/24/18  Educator  [facility]  Instruction Review Code  1- Verbalizes Understanding      Falls Prevention: - Provides verbal and written material to individual with discussion of falls prevention and safety.   Cardiac Rehab from 01/24/2018 in [facility] Cardiac and Pulmonary Rehab  Date  01/24/18  Educator  [facility]  Instruction Review Code  1- Verbalizes Understanding      Diabetes: - Individual verbal and written instruction to review signs/symptoms of diabetes, desired ranges of glucose level fasting, after meals and with exercise. Acknowledge that pre and post exercise glucose checks will be done for 3 sessions at entry of program.   Cardiac Rehab from 01/24/2018 in [facility] Cardiac and Pulmonary Rehab  Date  01/24/18  Educator  [facility]  Instruction Review Code  1- Verbalizes Understanding      Know Your Numbers and Risk Factors: -Group verbal and written instruction about important numbers in your health.  Discussion of what are risk factors and how they play a role in the disease process.  Review of Cholesterol, Blood Pressure, Diabetes, and BMI and the role they play in your overall health.   Sleep Hygiene: -Provides group verbal and written instruction about how sleep can affect your health.  Define sleep hygiene, discuss sleep cycles and impact of sleep habits. Review good sleep hygiene  tips.    Other: -Provides group and verbal instruction on various topics (see comments)   Knowledge Questionnaire Score: Knowledge Questionnaire Score - 01/24/18 1205      Knowledge Questionnaire Score   Pre Score  22/26   correct answers reviewed with Clarene Critchley, focus on exercise and nutrition      Core Components/Risk Factors/Patient Goals at Admission: Personal Goals and Risk Factors at Admission - 01/24/18 1203      Core Components/Risk Factors/Patient Goals on Admission    Weight Management  Yes;Obesity;Weight Loss    Intervention  Weight Management: Develop a combined nutrition and exercise program designed to reach desired caloric intake, while maintaining appropriate intake of nutrient and fiber, sodium and fats, and appropriate energy expenditure required for the weight goal.;Weight Management: Provide education and appropriate resources to help participant work on and attain dietary goals.;Weight Management/Obesity: Establish reasonable short term and long term weight goals.;Obesity: Provide education and appropriate resources to help participant work on and attain dietary goals.    Admit Weight  191 lb (86.6 kg)    Goal Weight: Short Term  187 lb (84.8 kg)    Goal Weight: Long Term  140 lb (63.5 kg)    Expected Outcomes  Short Term: Continue to assess and modify interventions until short term weight is achieved;Long Term: Adherence to nutrition  and physical activity/exercise program aimed toward attainment of established weight goal;Weight Loss: Understanding of general recommendations for a balanced deficit meal plan, which promotes 1-2 lb weight loss per week and includes a negative energy balance of [phone] kcal/d;Understanding recommendations for meals to include 15-35% energy as protein, 25-35% energy from fat, 35-60% energy from carbohydrates, less than '200mg'$  of dietary cholesterol, 20-35 gm of total fiber daily;Understanding of distribution of calorie intake throughout the  day with the consumption of 4-5 meals/snacks    Tobacco Cessation  Yes   quit 01/10/18   Intervention  Assist the participant in steps to quit. Provide individualized education and counseling about committing to Tobacco Cessation, relapse prevention, and pharmacological support that can be provided by physician.;Advice worker, assist with locating and accessing local/national Quit Smoking programs, and support quit date choice.    Expected Outcomes  Short Term: Will quit all tobacco product use, adhering to prevention of relapse plan.;Long Term: Complete abstinence from all tobacco products for at least 12 months from quit date.    Diabetes  Yes    Intervention  Provide education about signs/symptoms and action to take for hypo/hyperglycemia.;Provide education about proper nutrition, including hydration, and aerobic/resistive exercise prescription along with prescribed medications to achieve blood glucose in normal ranges: Fasting glucose 65-99 mg/dL    Expected Outcomes  Short Term: Participant verbalizes understanding of the signs/symptoms and immediate care of hyper/hypoglycemia, proper foot care and importance of medication, aerobic/resistive exercise and nutrition plan for blood glucose control.;Long Term: Attainment of HbA1C < 7%.    Hypertension  Yes    Intervention  Provide education on lifestyle modifcations including regular physical activity/exercise, weight management, moderate sodium restriction and increased consumption of fresh fruit, vegetables, and low fat dairy, alcohol moderation, and smoking cessation.;Monitor prescription use compliance.    Expected Outcomes  Short Term: Continued assessment and intervention until BP is < 140/36m HG in hypertensive participants. < 130/851mHG in hypertensive participants with diabetes, heart failure or chronic kidney disease.;Long Term: Maintenance of blood pressure at goal levels.    Lipids  Yes    Intervention  Provide education and  support for participant on nutrition & aerobic/resistive exercise along with prescribed medications to achieve LDL '70mg'$ , HDL >'40mg'$ .    Expected Outcomes  Short Term: Participant states understanding of desired cholesterol values and is compliant with medications prescribed. Participant is following exercise prescription and nutrition guidelines.;Long Term: Cholesterol controlled with medications as prescribed, with individualized exercise RX and with personalized nutrition plan. Value goals: LDL < '70mg'$ , HDL > 40 mg.       Core Components/Risk Factors/Patient Goals Review:    Core Components/Risk Factors/Patient Goals at Discharge (Final Review):    ITP Comments: ITP Comments    Row Name 01/24/18 1155 01/26/18 0557         ITP Comments  Med Review completed. Initial ITP created. [facility]  30 day review completed. ITP sent to Dr. MaEmily FilbertMedical Director of Cardiac Rehab. Continue with ITP unless changes are made by physician  New to program         Comments:

## 2018-01-26 NOTE — Progress Notes (Signed)
Daily Session Note  Patient Details  Name: Sherry Torres MRN: [id number] Date of Birth: [birth year] Referring Provider:     Cardiac Rehab from 01/24/2018 in [facility] Cardiac and Pulmonary Rehab  Referring Provider  Kathlyn Sacramento MD [Attending: Dr. Harrell Gave End]      Encounter Date: 01/26/2018  Check In: Session Check In - 01/26/18 1751      Check-In   Supervising physician immediately available to respond to emergencies  See telemetry face sheet for immediately available ER MD    Location  [facility]-Cardiac & Pulmonary Rehab    Staff Present  Renita Papa, RN Vickki Hearing, BA, ACSM CEP, Exercise Physiologist;Carroll Enterkin, RN, BSN    Medication changes reported      No    Fall or balance concerns reported     No    Warm-up and Cool-down  Performed on first and last piece of equipment    Resistance Training Performed  Yes    VAD Patient?  No    PAD/SET Patient?  No      Pain Assessment   Currently in Pain?  No/denies    Multiple Pain Sites  No          Social History   Tobacco Use  Smoking Status Former Smoker  . Types: Cigarettes  Smokeless Tobacco Never Used  Tobacco Comment   hasnt smoked in 1 wk    Goals Met:  Independence with exercise equipment Exercise tolerated well No report of cardiac concerns or symptoms Strength training completed today  Goals Unmet:  Not Applicable  Comments: First full day of exercise!  Patient was oriented to gym and equipment including functions, settings, policies, and procedures.  Patient's individual exercise prescription and treatment plan were reviewed.  All starting workloads were established based on the results of the 6 minute walk test done at initial orientation visit.  The plan for exercise progression was also introduced and progression will be customized based on patient's performance and goals.    Dr. Emily Filbert is Medical Director for [facility].

## 2018-01-27 ENCOUNTER — Ambulatory Visit: Payer: BLUE CROSS/BLUE SHIELD

## 2018-01-27 DIAGNOSIS — I214 Non-ST elevation (NSTEMI) myocardial infarction: Secondary | ICD-10-CM

## 2018-01-27 LAB — GLUCOSE, CAPILLARY
GLUCOSE-CAPILLARY: 64 mg/dL — AB (ref 70–99)
Glucose-Capillary: 117 mg/dL — ABNORMAL HIGH (ref 70–99)
Glucose-Capillary: 83 mg/dL (ref 70–99)

## 2018-01-27 NOTE — Progress Notes (Signed)
Daily Session Note  Patient Details  Name: Sherry Torres MRN: [id number] Date of Birth: [birth year] Referring Provider:     Cardiac Rehab from 01/24/2018 in [facility] Cardiac and Pulmonary Rehab  Referring Provider  Kathlyn Sacramento MD [Attending: Dr. Harrell Gave End]      Encounter Date: 01/27/2018  Check In: Session Check In - 01/27/18 1617      Check-In   Supervising physician immediately available to respond to emergencies  See telemetry face sheet for immediately available ER MD    Location  [facility]-Cardiac & Pulmonary Rehab    Staff Present  Justin Mend RCP,RRT,BSRT;Meredith Sherryll Burger, RN Vickki Hearing, BA, ACSM CEP, Exercise Physiologist    Medication changes reported      No    Fall or balance concerns reported     No    Warm-up and Cool-down  Performed on first and last piece of equipment    Resistance Training Performed  Yes    VAD Patient?  No      Pain Assessment   Currently in Pain?  No/denies          Social History   Tobacco Use  Smoking Status Former Smoker  . Types: Cigarettes  Smokeless Tobacco Never Used  Tobacco Comment   hasnt smoked in 1 wk    Goals Met:  Independence with exercise equipment Exercise tolerated well No report of cardiac concerns or symptoms Strength training completed today  Goals Unmet:  Not Applicable  Comments: Pt able to follow exercise prescription today without complaint.  Will continue to monitor for progression.   Dr. Emily Filbert is Medical Director for [place] [facility].

## 2018-01-28 ENCOUNTER — Ambulatory Visit (INDEPENDENT_AMBULATORY_CARE_PROVIDER_SITE_OTHER): Payer: BLUE CROSS/BLUE SHIELD | Admitting: Nurse Practitioner

## 2018-01-28 ENCOUNTER — Encounter: Payer: Self-pay | Admitting: Nurse Practitioner

## 2018-01-28 ENCOUNTER — Telehealth: Payer: Self-pay | Admitting: Nurse Practitioner

## 2018-01-28 VITALS — BP 123/64 | HR 57 | Resp 16 | Ht 62.0 in | Wt 192.8 lb

## 2018-01-28 DIAGNOSIS — E1165 Type 2 diabetes mellitus with hyperglycemia: Secondary | ICD-10-CM | POA: Insufficient documentation

## 2018-01-28 DIAGNOSIS — I214 Non-ST elevation (NSTEMI) myocardial infarction: Secondary | ICD-10-CM

## 2018-01-28 DIAGNOSIS — R300 Dysuria: Secondary | ICD-10-CM

## 2018-01-28 DIAGNOSIS — R079 Chest pain, unspecified: Secondary | ICD-10-CM | POA: Diagnosis not present

## 2018-01-28 DIAGNOSIS — I10 Essential (primary) hypertension: Secondary | ICD-10-CM | POA: Diagnosis not present

## 2018-01-28 DIAGNOSIS — Z0001 Encounter for general adult medical examination with abnormal findings: Secondary | ICD-10-CM | POA: Diagnosis not present

## 2018-01-28 LAB — POCT GLYCOSYLATED HEMOGLOBIN (HGB A1C): Hemoglobin A1C: 5.7 % — AB (ref 4.0–5.6)

## 2018-01-28 NOTE — Telephone Encounter (Signed)
Thank you. Chest pain is left sided, not right. We did do ECG in office today and it was within normal limits. She states that pain is the same as it was when she was initially admitted to hospital.

## 2018-01-28 NOTE — Progress Notes (Signed)
[facility], [place] [address]  Internal MEDICINE  Office Visit Note  Patient Name: Sherry Torres  [id number]  [id number]  Date of Service: 02/09/2018   Pt is here for routine health maintenance examination   Chief Complaint  Patient presents with  . Annual Exam    pt stated that she is still having pains at night in her chest   . Diabetes    pt is concerned about blood sugar levels being low     The patient has been attending cardiac rehab after having NSTEMI. She is being managed with medications. She continues to have chest pain, intermittently. Having this nearly every day. This is worse with exertion. Will have to rest for several minutes, then is able to resume activity. Will need to take 1 to 2 nitroglycerine tablets to help relieve the pain. She does have a cardiologist, however, next appointment is not until next month.  Blood sugars dong very well. In fact, they read on the low side after rehab. Sugar is generally in 110s prior to starting. After her workout, sugar is around 60. She currently takes metformin '250mg'$  twice daily.    Current Medication: Outpatient Encounter Medications as of 01/28/2018  Medication Sig Note  . aspirin 81 MG chewable tablet Chew 1 tablet (81 mg total) by mouth daily.   Marland Kitchen atorvastatin (LIPITOR) 80 MG tablet Take 1 tablet (80 mg total) by mouth daily at 6 PM.   . bisoprolol (ZEBETA) 5 MG tablet Take 1 tablet (5 mg total) by mouth daily.   Marland Kitchen buPROPion (ZYBAN) 150 MG 12 hr tablet Take 1 tablet (150 mg total) by mouth 2 (two) times daily.   . clopidogrel (PLAVIX) 75 MG tablet Take 1 tablet (75 mg total) by mouth daily with breakfast.   . cyclobenzaprine (FLEXERIL) 5 MG tablet Take 1 tablet (5 mg total) by mouth 3 (three) times daily as needed for muscle spasms.   . metFORMIN (GLUCOPHAGE) 500 MG tablet Take 0.5 tablets (250 mg total) by mouth 2 (two) times daily with a meal.   . nitroGLYCERIN (NITROSTAT) 0.4  MG SL tablet Place 1 tablet (0.4 mg total) under the tongue every 5 (five) minutes as needed for chest pain.   Marland Kitchen olmesartan (BENICAR) 5 MG tablet Take 1 tablet (5 mg total) by mouth daily.   . VENTOLIN HFA 108 (90 Base) MCG/ACT inhaler Inhale 1-2 puffs into the lungs daily.   . [DISCONTINUED] hydrochlorothiazide (HYDRODIURIL) 12.5 MG tablet Take 1 tablet (12.5 mg total) by mouth daily.   . [DISCONTINUED] isosorbide mononitrate (IMDUR) 30 MG 24 hr tablet Take 1 tablet (30 mg total) by mouth daily.   . diclofenac sodium (VOLTAREN) 1 % GEL Apply 4 g topically 4 (four) times daily. 01/05/2018: Insurance has not approved, did not pick up from pharmacy  . traZODone (DESYREL) 50 MG tablet Take 0.5-1 tablets (25-50 mg total) by mouth at bedtime as needed for sleep.    No facility-administered encounter medications on file as of 01/28/2018.     Surgical History: Past Surgical History:  Procedure Laterality Date  . LEFT HEART CATH AND CORONARY ANGIOGRAPHY N/A 01/06/2018   Procedure: LEFT HEART CATH AND CORONARY ANGIOGRAPHY;  Surgeon: Wellington Hampshire, MD;  Location: [facility];  Service: Cardiovascular;  Laterality: N/A;    Medical History: Past Medical History:  Diagnosis Date  . Anxiety   . Asthma   . CAD (coronary artery disease)    a. 12/2017  NSTEMI;  b. 12/2017 MV: basal and mid inflat ischemia; c. 12/2017 Cath: LM nl, LAD 30ost, D1 mild dzs, LCX 40ost, 95d, RCA 70m(nondominant). LCX felt to be culprit but only 1.560mvessel-->Med rx.  . Diastolic dysfunction    a. 12/2017 Echo: EF 55-60%, no rwma, Gr1 DD.  . Marland Kitchenypertension   . Recurrent major depressive disorder, in partial remission (HCG[place]chales5/07/2017  . Tobacco abuse   . Type 2 diabetes mellitus (HCC)     Family History: Family History  Problem Relation Age of Onset  . Cancer Mother   . Breast cancer Mother 7040. Hyperlipidemia Father       Review of Systems  Constitutional: Positive for fatigue. Negative for activity change  and appetite change.  HENT: Negative for congestion, postnasal drip, rhinorrhea, sinus pressure and voice change.   Eyes: Negative.   Respiratory: Negative for cough, chest tightness, shortness of breath and wheezing.   Cardiovascular: Positive for chest pain. Negative for palpitations.       Blood pressure well managed. Having intermittent chest pain requiring rest and treatment with nitroglycerin.   Gastrointestinal: Negative for abdominal pain, constipation, diarrhea, nausea and vomiting.  Endocrine: Negative for cold intolerance, heat intolerance, polydipsia, polyphagia and polyuria.       Improving blood sugars.   Genitourinary: Negative.  Negative for frequency.  Musculoskeletal: Positive for arthralgias. Negative for neck pain.       Lleft shoulder pain has resolved since her last visit .  Skin: Negative for rash.  Allergic/Immunologic: Negative for environmental allergies.  Neurological: Negative for dizziness, weakness, light-headedness and headaches.  Hematological: Negative for adenopathy. Does not bruise/bleed easily.  Psychiatric/Behavioral: Positive for sleep disturbance. Negative for agitation. The patient is nervous/anxious.      Today's Vitals   01/28/18 0905  BP: 123/64  Pulse: (!) 57  Resp: 16  SpO2: 98%  Weight: 192 lb 12.8 oz (87.5 kg)  Height: '5\' 2"'$  (1.575 m)   Physical Exam  Constitutional: She is oriented to person, place, and time. She appears well-developed and well-nourished. No distress.  HENT:  Head: Normocephalic and atraumatic.  Nose: Nose normal.  Mouth/Throat: Oropharynx is clear and moist. No oropharyngeal exudate.  Eyes: Pupils are equal, round, and reactive to light. Conjunctivae and EOM are normal.  Neck: Normal range of motion. Neck supple. No JVD present. Carotid bruit is not present. No tracheal deviation present. No thyromegaly present.  Cardiovascular: Normal rate, regular rhythm, normal heart sounds and intact distal pulses. Exam reveals  no gallop and no friction rub.  No murmur heard. Pulses:      Dorsalis pedis pulses are 2+ on the right side, and 2+ on the left side.       Posterior tibial pulses are 2+ on the right side, and 2+ on the left side.  Pulmonary/Chest: Effort normal and breath sounds normal. No respiratory distress. She has no wheezes. She has no rales. She exhibits no tenderness. Right breast exhibits no inverted nipple, no mass, no nipple discharge, no skin change and no tenderness. Left breast exhibits no inverted nipple, no mass, no nipple discharge, no skin change and no tenderness.  Abdominal: Soft. Bowel sounds are normal. There is no tenderness.  Musculoskeletal: Normal range of motion.       Right foot: There is normal range of motion and no deformity.       Left foot: There is normal range of motion and no deformity.  Left shoulder pain has resolved. She has  good ROM and strength of left shoulder .  Feet:  Right Foot:  Protective Sensation: 10 sites tested. 10 sites sensed.  Skin Integrity: Negative for ulcer, warmth, callus or dry skin.  Left Foot:  Protective Sensation: 10 sites tested. 10 sites sensed.  Skin Integrity: Negative for ulcer, warmth, callus or dry skin.  Lymphadenopathy:    She has no cervical adenopathy.  Neurological: She is alert and oriented to person, place, and time. No cranial nerve deficit.  Skin: Skin is warm and dry. Capillary refill takes less than 2 seconds. She is not diaphoretic.  Psychiatric: She has a normal mood and affect. Her behavior is normal. Judgment and thought content normal.  Nursing note and vitals reviewed.    LABS: Recent Results (from the past 2160 hour(s))  Basic metabolic panel     Status: None   Collection Time: 01/04/18 12:58 PM  Result Value Ref Range   Sodium 138 135 - 145 mmol/L   Potassium 3.9 3.5 - 5.1 mmol/L   Chloride 103 98 - 111 mmol/L   CO2 26 22 - 32 mmol/L   Glucose, Bld 85 70 - 99 mg/dL   BUN 20 6 - 20 mg/dL   Creatinine, Ser  0.74 0.44 - 1.00 mg/dL   Calcium 9.2 8.9 - 10.3 mg/dL   GFR calc non Af Amer >60 >60 mL/min   GFR calc Af Amer >60 >60 mL/min    Comment: (NOTE) The eGFR has been calculated using the CKD EPI equation. This calculation has not been validated in all clinical situations. eGFR's persistently <60 mL/min signify possible Chronic Kidney Disease.    Anion gap 9 5 - 15    Comment: Performed at [facility], [address] [place], [place] [address]  CBC     Status: None   Collection Time: 01/04/18 12:58 PM  Result Value Ref Range   WBC 7.9 3.6 - 11.0 K/uL   RBC 4.72 3.80 - 5.20 MIL/uL   Hemoglobin 14.4 12.0 - 16.0 g/dL   HCT 41.8 35.0 - 47.0 %   MCV 88.7 80.0 - 100.0 fL   MCH 30.4 26.0 - 34.0 pg   MCHC 34.3 32.0 - 36.0 g/dL   RDW 14.2 11.5 - 14.5 %   Platelets 261 150 - 440 K/uL    Comment: Performed at [facility], [address] [place], [place] [address]  Troponin I     Status: None   Collection Time: 01/04/18 12:58 PM  Result Value Ref Range   Troponin I <0.03 <0.03 ng/mL    Comment: Performed at [facility], [address] [place], [place] [address]  Troponin I     Status: None   Collection Time: 01/04/18  4:03 PM  Result Value Ref Range   Troponin I <0.03 <0.03 ng/mL    Comment: Performed at [facility], [address] [place], [place] [address]  CBC     Status: Abnormal   Collection Time: 01/05/18 12:03 AM  Result Value Ref Range   WBC 10.0 3.6 - 11.0 K/uL   RBC 4.61 3.80 - 5.20 MIL/uL   Hemoglobin 14.1 12.0 - 16.0 g/dL   HCT 40.9 35.0 - 47.0 %   MCV 88.7 80.0 - 100.0 fL   MCH 30.7 26.0 - 34.0 pg   MCHC 34.6 32.0 - 36.0 g/dL   RDW 14.7 (H) 11.5 - 14.5 %   Platelets 257 150 - 440 K/uL    Comment: Performed at [facility], [place], [place] [address]  Comprehensive metabolic panel     Status: Abnormal   Collection Time: 01/05/18 12:03 AM  Result Value Ref Range   Sodium 138 135 - 145 mmol/L    Potassium 4.2 3.5 - 5.1 mmol/L   Chloride 103 98 - 111 mmol/L   CO2 28 22 - 32 mmol/L   Glucose, Bld 139 (H) 70 - 99 mg/dL   BUN 22 (H) 6 - 20 mg/dL   Creatinine, Ser 0.85 0.44 - 1.00 mg/dL   Calcium 9.1 8.9 - 10.3 mg/dL   Total Protein 7.6 6.5 - 8.1 g/dL   Albumin 4.0 3.5 - 5.0 g/dL   AST 16 15 - 41 U/L   ALT 16 0 - 44 U/L   Alkaline Phosphatase 54 38 - 126 U/L   Total Bilirubin 0.2 (L) 0.3 - 1.2 mg/dL   GFR calc non Af Amer >60 >60 mL/min   GFR calc Af Amer >60 >60 mL/min    Comment: (NOTE) The eGFR has been calculated using the CKD EPI equation. This calculation has not been validated in all clinical situations. eGFR's persistently <60 mL/min signify possible Chronic Kidney Disease.    Anion gap 7 5 - 15    Comment: Performed at [facility], [address] [place], [place] [address]  Troponin I     Status: None   Collection Time: 01/05/18 12:03 AM  Result Value Ref Range   Troponin I <0.03 <0.03 ng/mL    Comment: Performed at [facility], [address] [place], [place] [address]  Lipid panel     Status: Abnormal   Collection Time: 01/05/18 12:03 AM  Result Value Ref Range   Cholesterol 196 0 - 200 mg/dL   Triglycerides 220 (H) <150 mg/dL   HDL 39 (L) >40 mg/dL   Total CHOL/HDL Ratio 5.0 RATIO   VLDL 44 (H) 0 - 40 mg/dL   LDL Cholesterol 113 (H) 0 - 99 mg/dL    Comment:        Total Cholesterol/HDL:CHD Risk Coronary Heart Disease Risk Table                     Men   Women  1/2 Average Risk   3.4   3.3  Average Risk       5.0   4.4  2 X Average Risk   9.6   7.1  3 X Average Risk  23.4   11.0        Use the calculated Patient Ratio above and the CHD Risk Table to determine the patient's CHD Risk.        ATP III CLASSIFICATION (LDL):  <100     mg/dL   Optimal  100-129  mg/dL   Near or Above                    Optimal  130-159  mg/dL   Borderline  160-189  mg/dL   High  >190     mg/dL   Very High Performed at [facility], [place], [place] [address]   HIV antibody (Routine Testing)     Status: None   Collection Time: 01/05/18  8:40 AM  Result Value Ref Range   HIV Screen 4th Generation wRfx Non Reactive Non Reactive    Comment: (NOTE) Performed At: [facility] [place], [place] [id number] Rush Farmer MD RJ:[id number]   Troponin  I-serum (one time only)     Status: Abnormal   Collection Time: 01/05/18  8:40 AM  Result Value Ref Range   Troponin I 0.10 (HH) <0.03 ng/mL    Comment: CRITICAL RESULT CALLED TO, READ BACK BY AND VERIFIED WITH MEGAN JONES AT [phone] ON 01/05/18 [place]. Performed at [facility], [address] [place], [place] [address]   NM Myocar Multi W/Spect Tamela Oddi Motion / EF     Status: None   Collection Time: 01/05/18 11:48 AM  Result Value Ref Range   Rest HR 79 bpm   Rest BP 145/55 mmHg   Percent HR 83 %   Peak HR 136 bpm   Peak BP 181/101 mmHg   TID 1.08    LV sys vol 27 mL   LV dias vol 62 46 - 106 mL  Troponin I     Status: Abnormal   Collection Time: 01/05/18 11:59 AM  Result Value Ref Range   Troponin I 0.13 (HH) <0.03 ng/mL    Comment: CRITICAL VALUE NOTED. VALUE IS CONSISTENT WITH PREVIOUSLY REPORTED/CALLED VALUE. QSD Performed at [facility], [address] [place], [place] [address]   Protime-INR     Status: None   Collection Time: 01/05/18 11:59 AM  Result Value Ref Range   Prothrombin Time 12.8 11.4 - 15.2 seconds   INR 0.97     Comment: Performed at [facility], [address] [place], [place] [address]  APTT     Status: None   Collection Time: 01/05/18 11:59 AM  Result Value Ref Range   aPTT 34 24 - 36 seconds    Comment: Performed at [facility], [address] [place], [place] [address]  Glucose, capillary     Status: Abnormal   Collection Time: 01/05/18 12:39 PM  Result Value Ref Range   Glucose-Capillary 103 (H) 70 - 99 mg/dL  Glucose, capillary     Status: Abnormal    Collection Time: 01/05/18  5:11 PM  Result Value Ref Range   Glucose-Capillary 113 (H) 70 - 99 mg/dL  Heparin level (unfractionated)     Status: None   Collection Time: 01/05/18  6:05 PM  Result Value Ref Range   Heparin Unfractionated 0.32 0.30 - 0.70 IU/mL    Comment: (NOTE) If heparin results are below expected values, and patient dosage has  been confirmed, suggest follow up testing of antithrombin III levels. Performed at [facility], [address] [place], [place] [address]   Troponin I     Status: Abnormal   Collection Time: 01/05/18  6:05 PM  Result Value Ref Range   Troponin I 0.19 (HH) <0.03 ng/mL    Comment: CRITICAL VALUE NOTED. VALUE IS CONSISTENT WITH PREVIOUSLY REPORTED/CALLED VALUE.  TFK Performed at [facility], [address] [place], [place] [address]   Glucose, capillary     Status: Abnormal   Collection Time: 01/05/18  8:06 PM  Result Value Ref Range   Glucose-Capillary 185 (H) 70 - 99 mg/dL  ECHOCARDIOGRAM COMPLETE     Status: None   Collection Time: 01/05/18  8:33 PM  Result Value Ref Range   Weight 3,104 oz   Height 62 in   BP 146/65 mmHg  Lipid panel     Status: Abnormal   Collection Time: 01/06/18 12:13 AM  Result Value Ref Range   Cholesterol 164 0 - 200 mg/dL   Triglycerides 182 (H) <150 mg/dL   HDL 33 (L) >40 mg/dL  Total CHOL/HDL Ratio 5.0 RATIO   VLDL 36 0 - 40 mg/dL   LDL Cholesterol 95 0 - 99 mg/dL    Comment:        Total Cholesterol/HDL:CHD Risk Coronary Heart Disease Risk Table                     Men   Women  1/2 Average Risk   3.4   3.3  Average Risk       5.0   4.4  2 X Average Risk   9.6   7.1  3 X Average Risk  23.4   11.0        Use the calculated Patient Ratio above and the CHD Risk Table to determine the patient's CHD Risk.        ATP III CLASSIFICATION (LDL):  <100     mg/dL   Optimal  100-129  mg/dL   Near or Above                    Optimal  130-159  mg/dL   Borderline  160-189  mg/dL    High  >190     mg/dL   Very High Performed at [facility], [address] [place], [place] [address]   CBC     Status: None   Collection Time: 01/06/18 12:13 AM  Result Value Ref Range   WBC 8.7 3.6 - 11.0 K/uL   RBC 4.16 3.80 - 5.20 MIL/uL   Hemoglobin 12.6 12.0 - 16.0 g/dL   HCT 37.0 35.0 - 47.0 %   MCV 88.9 80.0 - 100.0 fL   MCH 30.4 26.0 - 34.0 pg   MCHC 34.1 32.0 - 36.0 g/dL   RDW 14.3 11.5 - 14.5 %   Platelets 238 150 - 440 K/uL    Comment: Performed at [facility], [address] [place], [place] [address]  Basic metabolic panel     Status: Abnormal   Collection Time: 01/06/18 12:13 AM  Result Value Ref Range   Sodium 140 135 - 145 mmol/L   Potassium 3.9 3.5 - 5.1 mmol/L   Chloride 107 98 - 111 mmol/L   CO2 26 22 - 32 mmol/L   Glucose, Bld 153 (H) 70 - 99 mg/dL   BUN 15 6 - 20 mg/dL   Creatinine, Ser 0.71 0.44 - 1.00 mg/dL   Calcium 8.6 (L) 8.9 - 10.3 mg/dL   GFR calc non Af Amer >60 >60 mL/min   GFR calc Af Amer >60 >60 mL/min    Comment: (NOTE) The eGFR has been calculated using the CKD EPI equation. This calculation has not been validated in all clinical situations. eGFR's persistently <60 mL/min signify possible Chronic Kidney Disease.    Anion gap 7 5 - 15    Comment: Performed at [facility], [place], [place] [address]  Heparin level (unfractionated)     Status: Abnormal   Collection Time: 01/06/18 12:13 AM  Result Value Ref Range   Heparin Unfractionated 0.17 (L) 0.30 - 0.70 IU/mL    Comment: (NOTE) If heparin results are below expected values, and patient dosage has  been confirmed, suggest follow up testing of antithrombin III levels. Performed at [facility], [address] [place], [place] [address]   Glucose, capillary     Status: Abnormal   Collection Time: 01/06/18  7:40 AM  Result Value Ref Range   Glucose-Capillary 120 (H) 70 - 99  mg/dL  Heparin level (unfractionated)     Status:  Abnormal   Collection Time: 01/06/18  7:54 AM  Result Value Ref Range   Heparin Unfractionated 0.25 (L) 0.30 - 0.70 IU/mL    Comment: (NOTE) If heparin results are below expected values, and patient dosage has  been confirmed, suggest follow up testing of antithrombin III levels. Performed at [facility], [address] [place], [place] [address]   Glucose, capillary     Status: Abnormal   Collection Time: 01/06/18 12:22 PM  Result Value Ref Range   Glucose-Capillary 108 (H) 70 - 99 mg/dL  Glucose, capillary     Status: None   Collection Time: 01/06/18  4:41 PM  Result Value Ref Range   Glucose-Capillary 90 70 - 99 mg/dL  Glucose, capillary     Status: None   Collection Time: 01/06/18  9:46 PM  Result Value Ref Range   Glucose-Capillary 94 70 - 99 mg/dL   Comment 1 Notify RN   CBC     Status: Abnormal   Collection Time: 01/07/18  3:43 AM  Result Value Ref Range   WBC 8.7 3.6 - 11.0 K/uL   RBC 3.84 3.80 - 5.20 MIL/uL   Hemoglobin 11.8 (L) 12.0 - 16.0 g/dL   HCT 34.0 (L) 35.0 - 47.0 %   MCV 88.5 80.0 - 100.0 fL   MCH 30.7 26.0 - 34.0 pg   MCHC 34.7 32.0 - 36.0 g/dL   RDW 14.4 11.5 - 14.5 %   Platelets 205 150 - 440 K/uL    Comment: Performed at [facility], [address] [place],  [address]  Glucose, capillary     Status: Abnormal   Collection Time: 01/07/18  7:30 AM  Result Value Ref Range   Glucose-Capillary 137 (H) 70 - 99 mg/dL  Glucose, capillary     Status: None   Collection Time: 01/26/18  4:02 PM  Result Value Ref Range   Glucose-Capillary 95 70 - 99 mg/dL  Glucose, capillary     Status: None   Collection Time: 01/26/18  5:46 PM  Result Value Ref Range   Glucose-Capillary 90 70 - 99 mg/dL  Glucose, capillary     Status: Abnormal   Collection Time: 01/27/18  4:07 PM  Result Value Ref Range   Glucose-Capillary 117 (H) 70 - 99 mg/dL  Glucose, capillary     Status: Abnormal   Collection Time: 01/27/18  5:23 PM  Result Value Ref  Range   Glucose-Capillary 64 (L) 70 - 99 mg/dL  Glucose, capillary     Status: None   Collection Time: 01/27/18  5:35 PM  Result Value Ref Range   Glucose-Capillary 83 70 - 99 mg/dL  UA/M w/rflx Culture, Routine     Status: None   Collection Time: 01/28/18  9:19 AM  Result Value Ref Range   Specific Gravity, UA 1.015 1.005 - 1.030   pH, UA 6.0 5.0 - 7.5   Color, UA Yellow Yellow   Appearance Ur Clear Clear   Leukocytes, UA Negative Negative   Protein, UA Negative Negative/Trace   Glucose, UA Negative Negative   Ketones, UA Negative Negative   RBC, UA Negative Negative   Bilirubin, UA Negative Negative   Urobilinogen, Ur 0.2 0.2 - 1.0 mg/dL   Nitrite, UA Negative Negative   Microscopic Examination Comment     Comment: Microscopic follows if indicated.   Microscopic Examination See below:     Comment: Microscopic was indicated and  was performed.   Urinalysis Reflex Comment     Comment: This specimen will not reflex to a Urine Culture.  Microscopic Examination     Status: None   Collection Time: 01/28/18  9:19 AM  Result Value Ref Range   WBC, UA 0-5 0 - 5 /hpf   RBC, UA 0-2 0 - 2 /hpf   Epithelial Cells (non renal) 0-10 0 - 10 /hpf   Casts None seen None seen /lpf   Mucus, UA Present Not Estab.   Bacteria, UA None seen None seen/Few  POCT HgB A1C     Status: Abnormal   Collection Time: 01/28/18  9:28 AM  Result Value Ref Range   Hemoglobin A1C 5.7 (A) 4.0 - 5.6 %   HbA1c POC (<> result, manual entry)     HbA1c, POC (prediabetic range)     HbA1c, POC (controlled diabetic range)    Glucose, capillary     Status: Abnormal   Collection Time: 01/31/18  4:04 PM  Result Value Ref Range   Glucose-Capillary 122 (H) 70 - 99 mg/dL  Glucose, capillary     Status: Abnormal   Collection Time: 01/31/18  5:18 PM  Result Value Ref Range   Glucose-Capillary 111 (H) 70 - 99 mg/dL  Basic metabolic panel     Status: None   Collection Time: 02/02/18  3:15 PM  Result Value Ref Range    Glucose 82 65 - 99 mg/dL   BUN 14 6 - 24 mg/dL   Creatinine, Ser 0.80 0.57 - 1.00 mg/dL   GFR calc non Af Amer 82 >59 mL/min/1.73   GFR calc Af Amer 95 >59 mL/min/1.73   BUN/Creatinine Ratio 18 9 - 23   Sodium 141 134 - 144 mmol/L   Potassium 4.7 3.5 - 5.2 mmol/L   Chloride 102 96 - 106 mmol/L   CO2 25 20 - 29 mmol/L   Calcium 9.3 8.7 - 10.2 mg/dL   Assessment/Plan: 1. Encounter for general adult medical examination with abnormal findings Annual health maintenance exam today.  2. Uncontrolled type 2 diabetes mellitus with hyperglycemia (HCC) - POCT HgB A1C 5.9 today. Continue diabetic medication as prescribed.   3. Essential hypertension Stable. Continue bp medication as prescribed  4. Chest pain, unspecified type - EKG 12-Lead repeat ECG is within normal limits. Will get patient sooner appointment with cardiology, as she does not see them until next month.  5. NSTEMI (non-ST elevated myocardial infarction) ([place]) Recovered. Encouraged her to follow up with cardiology as schedoled.  6. Dysuria - UA/M w/rflx Culture, Routine  General Counseling: Sokhna verbalizes understanding of the findings of todays visit and agrees with plan of treatment. I have discussed any further diagnostic evaluation that may be needed or ordered today. We also reviewed her medications today. she has been encouraged to call the office with any questions or concerns that should arise related to todays visit.    Counseling:  Diabetes Counseling:  1. Addition of ACE inh/ ARB'S for nephroprotection. Microalbumin is updated  2. Diabetic foot care, prevention of complications. Podiatry consult 3. Exercise and lose weight.  4. Diabetic eye examination, Diabetic eye exam is updated  5. Monitor blood sugar closlely. nutrition counseling.  6. Sign and symptoms of hypoglycemia including shaking sweating,confusion and headaches.   This patient was seen by Leretha Pol FNP Collaboration with Dr Lavera Guise  as a part of collaborative care agreement  Orders Placed This Encounter  Procedures  . Microscopic Examination  . UA/M  w/rflx Culture, Routine  . POCT HgB A1C  . EKG 12-Lead     Time spent: [place], MD  Internal Medicine

## 2018-01-28 NOTE — Telephone Encounter (Signed)
Sherry DikeJennifer,  PCP requesting f/u appointment to be moved up for continued chest pain.  Can you see if APP, Dr. Kirke CorinArida, or I can work her in sooner?  Thanks.  Thayer Ohmhris

## 2018-01-29 LAB — MICROSCOPIC EXAMINATION
Bacteria, UA: NONE SEEN
CASTS: NONE SEEN /LPF

## 2018-01-29 LAB — UA/M W/RFLX CULTURE, ROUTINE
Bilirubin, UA: NEGATIVE
Glucose, UA: NEGATIVE
KETONES UA: NEGATIVE
LEUKOCYTES UA: NEGATIVE
NITRITE UA: NEGATIVE
Protein, UA: NEGATIVE
RBC UA: NEGATIVE
Specific Gravity, UA: 1.015 (ref 1.005–1.030)
UUROB: 0.2 mg/dL (ref 0.2–1.0)
pH, UA: 6 (ref 5.0–7.5)

## 2018-01-31 DIAGNOSIS — I214 Non-ST elevation (NSTEMI) myocardial infarction: Secondary | ICD-10-CM

## 2018-01-31 LAB — GLUCOSE, CAPILLARY
GLUCOSE-CAPILLARY: 111 mg/dL — AB (ref 70–99)
Glucose-Capillary: 122 mg/dL — ABNORMAL HIGH (ref 70–99)

## 2018-01-31 NOTE — Progress Notes (Signed)
Sherry Cosierheresa c/o at night after dinner she has chest pain. I asked her to contact her Cardiologist. She does not have chest pain or any chest discomfort s/s exercising in Cardiac Rehab. I faxed an FYI to her Cardiologist.

## 2018-01-31 NOTE — Progress Notes (Signed)
Daily Session Note  Patient Details  Name: Sherry Torres MRN: [id number] Date of Birth: 10-20-1960 Referring Provider:     Cardiac Rehab from 01/24/2018 in [facility]  Referring Provider  Kathlyn Sacramento MD [Attending: Dr. Harrell Gave End]      Encounter Date: 01/31/2018  Check In: Session Check In - 01/31/18 [place]      Check-In   Supervising physician immediately available to respond to emergencies  See telemetry face sheet for immediately available ER MD    Location  [facility]-Cardiac & Pulmonary Rehab    Staff Present  Nada Maclachlan, BA, ACSM CEP, Exercise Physiologist;Carroll Enterkin, RN, Moises Blood, BS, ACSM CEP, Exercise Physiologist    Medication changes reported      No    Fall or balance concerns reported     No    Warm-up and Cool-down  Performed on first and last piece of equipment    Resistance Training Performed  Yes    VAD Patient?  No    PAD/SET Patient?  No      Pain Assessment   Currently in Pain?  No/denies    Multiple Pain Sites  No          Social History   Tobacco Use  Smoking Status Former Smoker  . Types: Cigarettes  Smokeless Tobacco Never Used  Tobacco Comment   hasnt smoked in 1 wk    Goals Met:  Independence with exercise equipment Exercise tolerated well No report of cardiac concerns or symptoms Strength training completed today  Goals Unmet:  Not Applicable  Comments: Pt able to follow exercise prescription today without complaint.  Will continue to monitor for progression.    Dr. Emily Filbert is Medical Director for [place] and [facility].

## 2018-02-01 NOTE — Telephone Encounter (Signed)
S/w patient. She is agreeable to come in tomorrow at 2:30 pm to see Alycia RossettiRyan. Appt scheduled.

## 2018-02-02 ENCOUNTER — Ambulatory Visit: Payer: BLUE CROSS/BLUE SHIELD | Admitting: Physician Assistant

## 2018-02-02 ENCOUNTER — Encounter: Payer: Self-pay | Admitting: *Deleted

## 2018-02-02 ENCOUNTER — Encounter: Payer: Self-pay | Admitting: Physician Assistant

## 2018-02-02 VITALS — BP 140/70 | HR 65 | Ht 61.5 in | Wt 195.0 lb

## 2018-02-02 DIAGNOSIS — I10 Essential (primary) hypertension: Secondary | ICD-10-CM

## 2018-02-02 DIAGNOSIS — R079 Chest pain, unspecified: Secondary | ICD-10-CM | POA: Diagnosis not present

## 2018-02-02 DIAGNOSIS — I208 Other forms of angina pectoris: Secondary | ICD-10-CM

## 2018-02-02 DIAGNOSIS — Z720 Tobacco use: Secondary | ICD-10-CM

## 2018-02-02 DIAGNOSIS — I25118 Atherosclerotic heart disease of native coronary artery with other forms of angina pectoris: Secondary | ICD-10-CM | POA: Diagnosis not present

## 2018-02-02 DIAGNOSIS — E782 Mixed hyperlipidemia: Secondary | ICD-10-CM | POA: Diagnosis not present

## 2018-02-02 MED ORDER — ISOSORBIDE MONONITRATE ER 60 MG PO TB24: 60.0000 mg | ORAL_TABLET | Freq: Every day | ORAL | 3 refills | Status: DC

## 2018-02-02 NOTE — Progress Notes (Signed)
Cardiology Office Note Date:  02/02/2018  Patient ID:  Sherry Torres, DOB 09/06/1960, MRN [id number] PCP:  Carlean JewsBoscia, Heather E, NP  Cardiologist:  Dr. Okey DupreEnd, MD    Chief Complaint: Chest pain  History of Present Illness: Sherry Torres is a 57 y.o. female with history of CAD with NSTEMI in 12/2017 with finding of small vessel CAD on LHC, DM2, HTN, obesity, anxiety, depression, and tobacco abuse who presents for evaluation of chest pain.   She was admitted to the hospital in 12/2017 with chest pain and ruled in for a NSTEMI with a peak troponin of 0.19. Echo showed normal LVSF. Myoview showed basal and mid inferolateral ischemia. She continued to note intermittent chest discomfort and underwent LHC that showed severe LCx and RCA disease. The LCx was felt to be the culprit lesion; however, it was 1.5 mm and not amenable to PCI. The RCA was nondominant. Medical therapy was advised. At discharge, her HCTZ was held and in that setting, she noted increasing lower extremity swelling that improved with restarting of her HCTZ by PCP. She was last seen in the office on 8/29 and was doing reasonably well with a brief episode of chest discomfort that occurred at rest. She was referred to cardiac rehab. She called in early 01/2018 with possible reflux symptoms in the setting of eating increased sugar-free foods. She was advised to try OTC PPI as symptoms were not felt to be cardiac in etiology.   She was seen by PCP on 01/28/18 with ongoing left-sided chest pain requiring prn SL NTG. EKG at that office showed sinus bradycardia without acute st/t changes.   She comes in today noting, since about one week out from her hospital discharge, she has had almost nightly episodes of chest pain that improve upon sitting up or taking SL NTG x 1-2. She is able to lay down during the day to rest or nap and be completely asymptomatic. She is able to exert herself and be asymptomatic. This pain she is having at nighttime is  located along the upper left chest wall and will sometimes radiate to the central chest. Pain feels similar to her angina that she had in 12/2017, though "not as severe." Currently, chest pain free. Compliant with all medications. Continues to work with cardiac rehab and has not been limited by angina with her exercises. She does note her symptoms seem to be worse if she eats a large meal in the evening time and is trying to eat her dinner by 6-7 PM now. No associated diaphoresis, nausea, vomiting, palpitations, dizziness, presyncope, or syncope.    Past Medical History:  Diagnosis Date  . Anxiety   . Asthma   . CAD (coronary artery disease)    a. 12/2017 NSTEMI;  b. 12/2017 MV: basal and mid inflat ischemia; c. 12/2017 Cath: LM nl, LAD 30ost, D1 mild dzs, LCX 40ost, 95d, RCA 6689m (nondominant). LCX felt to be culprit but only 1.585mm vessel-->Med rx.  . Diastolic dysfunction    a. 12/2017 Echo: EF 55-60%, no rwma, Gr1 DD.  Marland Kitchen. Hypertension   . Recurrent major depressive disorder, in partial remission (HCC) 09/17/2017  . Tobacco abuse   . Type 2 diabetes mellitus (HCC)     Past Surgical History:  Procedure Laterality Date  . LEFT HEART CATH AND CORONARY ANGIOGRAPHY N/A 01/06/2018   Procedure: LEFT HEART CATH AND CORONARY ANGIOGRAPHY;  Surgeon: Iran OuchArida, Muhammad A, MD;  Location: [facility] INVASIVE CV LAB;  Service: Cardiovascular;  Laterality: N/A;  Current Meds  Medication Sig  . aspirin 81 MG chewable tablet Chew 1 tablet (81 mg total) by mouth daily.  Marland Kitchen atorvastatin (LIPITOR) 80 MG tablet Take 1 tablet (80 mg total) by mouth daily at 6 PM.  . bisoprolol (ZEBETA) 5 MG tablet Take 1 tablet (5 mg total) by mouth daily.  Marland Kitchen buPROPion (ZYBAN) 150 MG 12 hr tablet Take 1 tablet (150 mg total) by mouth 2 (two) times daily.  . clopidogrel (PLAVIX) 75 MG tablet Take 1 tablet (75 mg total) by mouth daily with breakfast.  . cyclobenzaprine (FLEXERIL) 5 MG tablet Take 1 tablet (5 mg total) by mouth 3 (three) times  daily as needed for muscle spasms.  . diclofenac sodium (VOLTAREN) 1 % GEL Apply 4 g topically 4 (four) times daily.  . hydrochlorothiazide (HYDRODIURIL) 12.5 MG tablet Take 1 tablet (12.5 mg total) by mouth daily.  . isosorbide mononitrate (IMDUR) 30 MG 24 hr tablet Take 1 tablet (30 mg total) by mouth daily.  . metFORMIN (GLUCOPHAGE) 500 MG tablet Take 0.5 tablets (250 mg total) by mouth 2 (two) times daily with a meal.  . nitroGLYCERIN (NITROSTAT) 0.4 MG SL tablet Place 1 tablet (0.4 mg total) under the tongue every 5 (five) minutes as needed for chest pain.  Marland Kitchen olmesartan (BENICAR) 5 MG tablet Take 1 tablet (5 mg total) by mouth daily.  . traZODone (DESYREL) 50 MG tablet Take 0.5-1 tablets (25-50 mg total) by mouth at bedtime as needed for sleep.  . VENTOLIN HFA 108 (90 Base) MCG/ACT inhaler Inhale 1-2 puffs into the lungs daily.    Allergies:   Codeine   Social History:  The patient  reports that she has quit smoking. Her smoking use included cigarettes. She has never used smokeless tobacco. She reports that she does not drink alcohol or use drugs.   Family History:  The patient's family history includes Breast cancer (age of onset: 40) in her mother; Cancer in her mother; Hyperlipidemia in her father.  ROS:   Review of Systems  Constitutional: Positive for malaise/fatigue. Negative for chills, diaphoresis, fever and weight loss.  HENT: Negative for congestion.   Eyes: Negative for discharge and redness.  Respiratory: Negative for cough, hemoptysis, sputum production, shortness of breath and wheezing.   Cardiovascular: Positive for chest pain. Negative for palpitations, orthopnea, claudication, leg swelling and PND.  Gastrointestinal: Negative for abdominal pain, blood in stool, heartburn, melena, nausea and vomiting.  Genitourinary: Negative for hematuria.  Musculoskeletal: Negative for falls and myalgias.  Skin: Negative for rash.  Neurological: Positive for weakness. Negative for  dizziness, tingling, tremors, sensory change, speech change, focal weakness and loss of consciousness.  Endo/Heme/Allergies: Does not bruise/bleed easily.  Psychiatric/Behavioral: Negative for substance abuse. The patient is nervous/anxious.   All other systems reviewed and are negative.    PHYSICAL EXAM:  VS:  BP 140/70 (BP Location: Left Arm, Patient Position: Sitting, Cuff Size: Large)   Pulse 65   Ht 5' 1.5" (1.562 m)   Wt 195 lb (88.5 kg)   BMI 36.25 kg/m  BMI: Body mass index is 36.25 kg/m.  Physical Exam  Constitutional: She is oriented to person, place, and time. She appears well-developed and well-nourished.  HENT:  Head: Normocephalic and atraumatic.  Eyes: Right eye exhibits no discharge. Left eye exhibits no discharge.  Neck: Normal range of motion. No JVD present.  Cardiovascular: Normal rate, regular rhythm, S1 normal, S2 normal and normal heart sounds. Exam reveals no distant heart sounds, no friction  rub, no midsystolic click and no opening snap.  No murmur heard. Pulses:      Posterior tibial pulses are 2+ on the right side, and 2+ on the left side.  Pulmonary/Chest: Effort normal and breath sounds normal. No respiratory distress. She has no decreased breath sounds. She has no wheezes. She has no rales. She exhibits no tenderness.  Abdominal: Soft. She exhibits no distension. There is no tenderness.  Musculoskeletal: She exhibits no edema.  Neurological: She is alert and oriented to person, place, and time.  Skin: Skin is warm and dry. No cyanosis. Nails show no clubbing.  Psychiatric: She has a normal mood and affect. Her speech is normal and behavior is normal. Judgment and thought content normal.     EKG:  Was ordered and interpreted by me today. Shows NSR, 65 bpm, normal axis, low voltage QRS along the precordial leads, no acute st/t changes  Recent Labs: 06/03/2017: TSH 2.090 01/05/2018: ALT 16 01/06/2018: BUN 15; Creatinine, Ser 0.71; Potassium 3.9; Sodium  140 01/07/2018: Hemoglobin 11.8; Platelets 205  01/06/2018: Cholesterol 164; HDL 33; LDL Cholesterol 95; Total CHOL/HDL Ratio 5.0; Triglycerides 182; VLDL 36   CrCl cannot be calculated (Patient's most recent lab result is older than the maximum 21 days allowed.).   Wt Readings from Last 3 Encounters:  02/02/18 195 lb (88.5 kg)  01/28/18 192 lb 12.8 oz (87.5 kg)  01/24/18 192 lb 1.6 oz (87.1 kg)     Other studies reviewed: Additional studies/records reviewed today include: summarized above  ASSESSMENT AND PLAN:  1. CAD involving the native coronary arteries with stable, microvascular angina: Currently, symptom free. She only notes chest pain at night time after she has been asleep. She can lay down and nap during the day (in the bed or sofa) and be completely asymptomatic. She is not having any exertional pains. Cannot exclude possible angina decubitus vs possible pericarditis vs worsening GERD given correlation with large late evening meals. Increase Imdur to 60 mg daily (to be taken as 30 mg bid). Check echo to evaluate for pericardial effusion. Consider starting PPI. Continue ASA, Plavix, SL NTG, and bisoprolol. If needed, continue to escalate Imdur +/- addition of Ranexa. She will need to remain off work until her symptoms improve. Even when she does go back to work, I would prefer she avoid heavy lifting.   2. HTN: BP mildly elevated today. Increase Imdur as above. Consider increasing HCTZ to 25 mg daily if needed. Continue bisoprolol and olmesartan.   3. HLD: LDL of 95 in 12/2017. Goal LDL < 70. Orders for recheck lipid are in place for 02/2018. Continue Lipitor 80 mg daily.   4. Tobacco abuse: Continues to abstain.   Disposition: F/u with me in 2 weeks.   Current medicines are reviewed at length with the patient today.  The patient did not have any concerns regarding medicines.  Signed, Eula Listen, PA-C 02/02/2018 2:32 PM     [facility] [place] [address] [place], [place] [address] [phone]

## 2018-02-02 NOTE — Patient Instructions (Signed)
Medication Instructions:  Your physician recommends that you continue on your current medications as directed. Please refer to the Current Medication list given to you today.   Labwork: Your physician recommends that you return for lab work in: TODAY- BMET.   Testing/Procedures: Your physician has requested that you have an echocardiogram. Echocardiography is a painless test that uses sound waves to create images of your heart. It provides your doctor with information about the size and shape of your heart and how well your heart's chambers and valves are working. This procedure takes approximately one hour. There are no restrictions for this procedure. You may get an IV, if needed, to receive an ultrasound enhancing agent through to better visualize your heart.    Follow-Up: Your physician recommends that you schedule a follow-up appointment in: 2 WEEKS WITH APP.   If you need a refill on your cardiac medications before your next appointment, please call your pharmacy.   Echocardiogram An echocardiogram, or echocardiography, uses sound waves (ultrasound) to produce an image of your heart. The echocardiogram is simple, painless, obtained within a short period of time, and offers valuable information to your health care provider. The images from an echocardiogram can provide information such as:  Evidence of coronary artery disease (CAD).  Heart size.  Heart muscle function.  Heart valve function.  Aneurysm detection.  Evidence of a past heart attack.  Fluid buildup around the heart.  Heart muscle thickening.  Assess heart valve function.  Tell a health care provider about:  Any allergies you have.  All medicines you are taking, including vitamins, herbs, eye drops, creams, and over-the-counter medicines.  Any problems you or family members have had with anesthetic medicines.  Any blood disorders you have.  Any surgeries you have had.  Any medical conditions you  have.  Whether you are pregnant or may be pregnant. What happens before the procedure? No special preparation is needed. Eat and drink normally. What happens during the procedure?  In order to produce an image of your heart, gel will be applied to your chest and a wand-like tool (transducer) will be moved over your chest. The gel will help transmit the sound waves from the transducer. The sound waves will harmlessly bounce off your heart to allow the heart images to be captured in real-time motion. These images will then be recorded.  You may need an IV to receive a medicine that improves the quality of the pictures. What happens after the procedure? You may return to your normal schedule including diet, activities, and medicines, unless your health care provider tells you otherwise. This information is not intended to replace advice given to you by your health care provider. Make sure you discuss any questions you have with your health care provider. Document Released: 05/01/2000 Document Revised: 12/21/2015 Document Reviewed: 01/09/2013 Elsevier Interactive Patient Education  2017 [employer]

## 2018-02-03 ENCOUNTER — Other Ambulatory Visit: Payer: Self-pay | Admitting: Physician Assistant

## 2018-02-03 ENCOUNTER — Ambulatory Visit (INDEPENDENT_AMBULATORY_CARE_PROVIDER_SITE_OTHER): Payer: BLUE CROSS/BLUE SHIELD

## 2018-02-03 ENCOUNTER — Telehealth: Payer: Self-pay | Admitting: Physician Assistant

## 2018-02-03 ENCOUNTER — Other Ambulatory Visit: Payer: Self-pay

## 2018-02-03 DIAGNOSIS — I2510 Atherosclerotic heart disease of native coronary artery without angina pectoris: Secondary | ICD-10-CM | POA: Diagnosis not present

## 2018-02-03 DIAGNOSIS — I10 Essential (primary) hypertension: Secondary | ICD-10-CM

## 2018-02-03 DIAGNOSIS — R079 Chest pain, unspecified: Secondary | ICD-10-CM | POA: Diagnosis not present

## 2018-02-03 LAB — BASIC METABOLIC PANEL
BUN/Creatinine Ratio: 18 (ref 9–23)
BUN: 14 mg/dL (ref 6–24)
CALCIUM: 9.3 mg/dL (ref 8.7–10.2)
CO2: 25 mmol/L (ref 20–29)
Chloride: 102 mmol/L (ref 96–106)
Creatinine, Ser: 0.8 mg/dL (ref 0.57–1.00)
GFR calc non Af Amer: 82 mL/min/{1.73_m2} (ref >59)
GFR, EST AFRICAN AMERICAN: 95 mL/min/{1.73_m2} (ref >59)
Glucose: 82 mg/dL (ref 65–99)
Potassium: 4.7 mmol/L (ref 3.5–5.2)
Sodium: 141 mmol/L (ref 134–144)

## 2018-02-03 MED ORDER — HYDROCHLOROTHIAZIDE 25 MG PO TABS: 25.0000 mg | ORAL_TABLET | Freq: Every day | ORAL | 3 refills | Status: DC

## 2018-02-03 NOTE — Telephone Encounter (Signed)
Spoke with patient and reviewed results of her labs and repeat BMP ordered. She is coming in today for her echocardiogram and will schedule her repeat labs at that time. She verbalized understanding that HCTZ could be increased back to 25 mg once daily. She verbalized understanding of our conversation, agreement with plan, and had no further questions at this time.

## 2018-02-03 NOTE — Telephone Encounter (Signed)
Patient returning call-patient has ECHO with us at 4pm, please call cell phone to discuss

## 2018-02-03 NOTE — Telephone Encounter (Signed)
I left a message for the patient to call at her home & cell #'s regarding her BMP results from 02/02/18.

## 2018-02-04 ENCOUNTER — Telehealth: Payer: Self-pay | Admitting: *Deleted

## 2018-02-04 NOTE — Telephone Encounter (Signed)
Results called to pt. Pt verbalized understanding.  

## 2018-02-04 NOTE — Telephone Encounter (Signed)
No answer. Left message to call back.   

## 2018-02-04 NOTE — Telephone Encounter (Signed)
-----   Message from Sondra Bargesyan M Dunn, PA-C sent at 02/04/2018  7:01 AM EDT ----- Echo showed normal EF with normal wall motion. Slightly stiffened heart. Optimal BP and HR control advised. Mildly leaky mitral valve that can be followed periodically. Normal pressures in the lungs. No pericardial effusion. Reassuring study.

## 2018-02-09 DIAGNOSIS — R300 Dysuria: Secondary | ICD-10-CM | POA: Insufficient documentation

## 2018-02-11 ENCOUNTER — Other Ambulatory Visit (INDEPENDENT_AMBULATORY_CARE_PROVIDER_SITE_OTHER): Payer: BLUE CROSS/BLUE SHIELD

## 2018-02-11 DIAGNOSIS — I10 Essential (primary) hypertension: Secondary | ICD-10-CM

## 2018-02-12 LAB — BASIC METABOLIC PANEL
BUN/Creatinine Ratio: 19 (ref 9–23)
BUN: 16 mg/dL (ref 6–24)
CALCIUM: 9.4 mg/dL (ref 8.7–10.2)
CO2: 24 mmol/L (ref 20–29)
Chloride: 101 mmol/L (ref 96–106)
Creatinine, Ser: 0.85 mg/dL (ref 0.57–1.00)
GFR calc Af Amer: 88 mL/min/{1.73_m2} (ref >59)
GFR, EST NON AFRICAN AMERICAN: 76 mL/min/{1.73_m2} (ref >59)
Glucose: 252 mg/dL — ABNORMAL HIGH (ref 65–99)
POTASSIUM: 4.5 mmol/L (ref 3.5–5.2)
Sodium: 140 mmol/L (ref 134–144)

## 2018-02-16 ENCOUNTER — Encounter: Payer: BLUE CROSS/BLUE SHIELD | Attending: Cardiovascular Disease

## 2018-02-16 DIAGNOSIS — Z7902 Long term (current) use of antithrombotics/antiplatelets: Secondary | ICD-10-CM | POA: Insufficient documentation

## 2018-02-16 DIAGNOSIS — Z79899 Other long term (current) drug therapy: Secondary | ICD-10-CM | POA: Insufficient documentation

## 2018-02-16 DIAGNOSIS — Z7982 Long term (current) use of aspirin: Secondary | ICD-10-CM | POA: Insufficient documentation

## 2018-02-16 DIAGNOSIS — I214 Non-ST elevation (NSTEMI) myocardial infarction: Secondary | ICD-10-CM | POA: Insufficient documentation

## 2018-02-16 DIAGNOSIS — Z7984 Long term (current) use of oral hypoglycemic drugs: Secondary | ICD-10-CM | POA: Insufficient documentation

## 2018-02-17 ENCOUNTER — Telehealth: Payer: Self-pay

## 2018-02-17 NOTE — Telephone Encounter (Signed)
LMOM

## 2018-02-18 ENCOUNTER — Ambulatory Visit: Payer: BLUE CROSS/BLUE SHIELD | Admitting: Nurse Practitioner

## 2018-02-18 ENCOUNTER — Telehealth: Payer: Self-pay

## 2018-02-18 ENCOUNTER — Ambulatory Visit: Payer: BLUE CROSS/BLUE SHIELD | Admitting: Internal Medicine

## 2018-02-18 ENCOUNTER — Encounter: Payer: Self-pay | Admitting: Nurse Practitioner

## 2018-02-18 VITALS — BP 140/76 | HR 65 | Ht 62.0 in | Wt 192.5 lb

## 2018-02-18 DIAGNOSIS — I25118 Atherosclerotic heart disease of native coronary artery with other forms of angina pectoris: Secondary | ICD-10-CM | POA: Diagnosis not present

## 2018-02-18 DIAGNOSIS — I10 Essential (primary) hypertension: Secondary | ICD-10-CM | POA: Diagnosis not present

## 2018-02-18 DIAGNOSIS — E785 Hyperlipidemia, unspecified: Secondary | ICD-10-CM

## 2018-02-18 DIAGNOSIS — E119 Type 2 diabetes mellitus without complications: Secondary | ICD-10-CM | POA: Diagnosis not present

## 2018-02-18 MED ORDER — ISOSORBIDE MONONITRATE ER 60 MG PO TB24: 60.0000 mg | ORAL_TABLET | Freq: Two times a day (BID) | ORAL | 6 refills | Status: DC

## 2018-02-18 NOTE — Progress Notes (Signed)
Office Visit    Patient Name: Sherry Torres Date of Encounter: 02/18/2018  Primary Care Provider:  Carlean Jews, NP Primary Cardiologist:  Yvonne Kendall, MD  Chief Complaint    57 y/o ? with a h/o DM II, HTN, obesity, tob abuse, and recent admission for NSTEMI w/ finding of small vessel CAD, who presents for f/u of chest pain.  Past Medical History    Past Medical History:  Diagnosis Date  . Anxiety   . Asthma   . CAD (coronary artery disease)    a. 12/2017 NSTEMI;  b. 12/2017 MV: basal and mid inflat ischemia; c. 12/2017 Cath: LM nl, LAD 30ost, D1 mild dzs, LCX 40ost, 95d, RCA 98m (nondominant). LCX felt to be culprit but only 1.87mm vessel-->Med rx.  . Diastolic dysfunction    a. 12/2017 Echo: EF 55-60%, no rwma, Gr1 DD; b. 01/2018 Echo: EF 60-65%, gr2 DD, no rwma, mild MR, nl RV fxn.  . Hypertension   . Recurrent major depressive disorder, in partial remission (HCC) 09/17/2017  . Tobacco abuse   . Type 2 diabetes mellitus (HCC)    Past Surgical History:  Procedure Laterality Date  . LEFT HEART CATH AND CORONARY ANGIOGRAPHY N/A 01/06/2018   Procedure: LEFT HEART CATH AND CORONARY ANGIOGRAPHY;  Surgeon: Iran Ouch, MD;  Location: [facility] INVASIVE CV [facility];  Service: Cardiovascular;  Laterality: N/A;    Allergies  Allergies  Allergen Reactions  . Codeine Itching    History of Present Illness    57 y/o ? with the above PMH including tob abuse, HTN, depression, anxiety, and DMII.  She was recently admitted to [facility] after presenting w/ sscp and r/i for NSTEMI w/ a trop of 0.19.  Echo showed nl EF.  MV was undertaken and showed basal and mid inferolateral ischemia.  She subsequently underwent cath, which revealed severe, small vessel, LCX (1.69mm vessel - likely the culprit), and RCA (nondominant) dzs.  Med Rx was recommended and she was placed on asa, plavix,  blocker, ARB, nitrate, and high potency statin rx.  I saw her in clinic on 8/29, at which time she was doing  reasonably well.  Approx two weeks later, she called into the office 2/2 recurrent c/p occurring exclusively at night, esp after eating a large meal.  She was seen 9/18, with recommendation for echo  EF 60-65%, gr2 DD, no rwma, mild MR, nl RV fxn. No pericardial effusion noted.  Her isosorbide dose was increased to 30 mg twice daily.  Since her last visit, she has generally done quite well during the day and can be active without any symptoms or limitations but sometime between 5 and 7 PM, she begins to notice mild exertional chest discomfort that resolves fairly quickly with rest.  This is similar to the pattern that was occurring prior to the change in the isosorbide dose.  She has not yet returned to work or cardiac rehab.  She has not been having any significant nocturnal symptoms.  She denies PND, orthopnea, dizziness, syncope, edema, or early satiety.  Home Medications    Prior to Admission medications   Medication Sig Start Date End Date Taking? Authorizing Provider  aspirin 81 MG chewable tablet Chew 1 tablet (81 mg total) by mouth daily. 01/07/18   Shaune Pollack, MD  atorvastatin (LIPITOR) 80 MG tablet Take 1 tablet (80 mg total) by mouth daily at 6 PM. 01/13/18   Creig Hines, NP  bisoprolol (ZEBETA) 5 MG tablet Take 1  tablet (5 mg total) by mouth daily. 01/13/18   Creig Hines, NP  buPROPion (ZYBAN) 150 MG 12 hr tablet Take 1 tablet (150 mg total) by mouth 2 (two) times daily. 09/17/17   Carlean Jews, NP  clopidogrel (PLAVIX) 75 MG tablet Take 1 tablet (75 mg total) by mouth daily with breakfast. 01/13/18   Creig Hines, NP  cyclobenzaprine (FLEXERIL) 5 MG tablet Take 1 tablet (5 mg total) by mouth 3 (three) times daily as needed for muscle spasms. 09/17/17   Carlean Jews, NP  diclofenac sodium (VOLTAREN) 1 % GEL Apply 4 g topically 4 (four) times daily. 01/03/18   Carlean Jews, NP  hydrochlorothiazide (HYDRODIURIL) 25 MG tablet Take 1 tablet (25 mg  total) by mouth daily. 02/03/18 05/04/18  Sondra Barges, PA-C  isosorbide mononitrate (IMDUR) 60 MG 24 hr tablet Take 1 tablet (60 mg total) by mouth daily. 02/02/18 05/03/18  Sondra Barges, PA-C  metFORMIN (GLUCOPHAGE) 500 MG tablet Take 0.5 tablets (250 mg total) by mouth 2 (two) times daily with a meal. 12/08/17   Boscia, Kathlynn Grate, NP  nitroGLYCERIN (NITROSTAT) 0.4 MG SL tablet Place 1 tablet (0.4 mg total) under the tongue every 5 (five) minutes as needed for chest pain. 01/07/18   Shaune Pollack, MD  olmesartan (BENICAR) 5 MG tablet Take 1 tablet (5 mg total) by mouth daily. 12/07/17   Carlean Jews, NP  traZODone (DESYREL) 50 MG tablet Take 0.5-1 tablets (25-50 mg total) by mouth at bedtime as needed for sleep. 01/11/18   Carlean Jews, NP  VENTOLIN HFA 108 (90 Base) MCG/ACT inhaler Inhale 1-2 puffs into the lungs daily. 11/09/17   Carlean Jews, NP    Review of Systems    Ongoing intermittent exertional chest discomfort most days of the week occurring exclusively in the early evening hours.  This may or may not be associated with dyspnea.  She denies palpitations, PND, orthopnea, dizziness, syncope, edema, or early satiety.  All other systems reviewed and are otherwise negative except as noted above.  Physical Exam    VS:  BP 140/76 (BP Location: Left Arm, Patient Position: Sitting, Cuff Size: Normal)   Pulse 65   Ht 5\' 2"  (1.575 m)   Wt 192 lb 8 oz (87.3 kg)   BMI 35.21 kg/m  , BMI Body mass index is 35.21 kg/m.  Repeat blood pressure 146/72 GEN: Well nourished, well developed, in no acute distress. HEENT: normal. Neck: Supple, no JVD, carotid bruits, or masses. Cardiac: RRR, 2/6 systolic murmur at the right upper sternal border, rubs, or gallops. No clubbing, cyanosis, edema.  Radials/DP/PT 2+ and equal bilaterally.  Respiratory:  Respirations regular and unlabored, clear to auscultation bilaterally. GI: Soft, nontender, nondistended, BS + x 4. MS: no deformity or  atrophy. Skin: warm and dry, no rash. Neuro:  Strength and sensation are intact. Psych: Normal affect.  Accessory Clinical Findings    ECG personally reviewed by me today -regular sinus rhythm, 65, no acute ST or T changes - no acute changes.  Assessment & Plan    1.  Stable angina/coronary artery disease: Status post non-STEMI in August with catheterization at that time revealing moderate to severe small vessel disease including a 95% distal circumflex and 95% mid, nondominant RCA.  Circumflex was only 1.5 mm vessel.  When she was last seen on September 18, she reported discomfort occurring in the evenings and sometimes waking her up at night.  Nitrate dosing was  increased to 30 mg twice daily.  Follow-up echo showed normal LV function with grade 2 diastolic dysfunction and no evidence of a pericardial effusion.  Since her last visit, she is less likely to have symptoms that wakes her up at night but she still has exertional symptoms that begin sometime between 5 and 7 PM, most nights of the week, lasting a few minutes, and resolving with rest.  She interestingly does not have symptoms during the day when she is most active.  Blood pressure is elevated today at 140/76.  I repeated this and got 146/72.  I will increase her isosorbide to 60 mg twice daily and have asked her to take her evening dose closer to 5 PM instead of later in the evening as she is currently doing.  She remains on aspirin, statin, beta-blocker, ARB, and Plavix therapy.  If titration of nitrates does not improve symptoms, I would plan to add Ranexa next.  2.  Essential hypertension: Pressure is elevated today as outlined above.  Increasing nitrate.  Continue beta-blocker and ARB.  3.  Hyperlipidemia: LDL was 95 at the time of her non-STEMI.  She has orders in place for fasting lipids this month.  4.  Tobacco abuse: She quit following her event.  I congratulated her on this and encouraged her to remain off of cigarettes.  5.   Morbid obesity: Patient is looking forward to getting back to cardiac rehabilitation as she does want to lose weight.  She asked about going on the keto diet and I recommended that she discuss this with a nutritionist at cardiac rehab but also consider a program that is going to help her to learn how to eat including appropriate portion sizes such as weight watchers or utilization of a phone application such as my fitness pal.  6.  Disposition: Follow-up in clinic in 1 month or sooner if necessary.   Nicolasa Ducking, NP 02/18/2018, 2:11 PM

## 2018-02-18 NOTE — Patient Instructions (Signed)
Medication Instructions: - Your physician has recommended you make the following change in your medication:  1) INCREASE imdur (isosorbide) 60 mg- take 1 tablet by mouth TWICE daily  Labwork: - none ordered  Procedures/Testing: - none ordered  Follow-Up: - Your physician recommends that you schedule a follow-up appointment in: 1 month with Dr. Kathlene Cote, NP/ Alycia Rossetti, [place]   Any Additional Special Instructions Will Be Listed Below (If Applicable).     If you need a refill on your cardiac medications before your next appointment, please call your pharmacy.

## 2018-02-18 NOTE — Addendum Note (Signed)
Addended by: Aurelio Jew on: 02/18/2018 04:00 PM   Modules accepted: Orders

## 2018-02-18 NOTE — Telephone Encounter (Signed)
Patient wants a return to work note restricting her to 6hr daily at this time.    Please call when ready

## 2018-02-18 NOTE — Telephone Encounter (Signed)
Please advise 

## 2018-02-21 ENCOUNTER — Telehealth: Payer: Self-pay | Admitting: Nurse Practitioner

## 2018-02-21 NOTE — Telephone Encounter (Signed)
Pt would like a note stating she is able to return to work this week for only 6 hrs a day and next week regular hours. Please call to discuss.

## 2018-02-21 NOTE — Telephone Encounter (Signed)
Duplicate entry. Spoke with patient and advised that this message is in providers basket for review and that once we hear back from him then we can put letter up front for her to pick up. She verbalized understanding with no further questions at this time.

## 2018-02-23 ENCOUNTER — Encounter: Payer: Self-pay | Admitting: *Deleted

## 2018-02-23 DIAGNOSIS — I214 Non-ST elevation (NSTEMI) myocardial infarction: Secondary | ICD-10-CM

## 2018-02-23 NOTE — Telephone Encounter (Signed)
That's reasonable

## 2018-02-23 NOTE — Telephone Encounter (Signed)
S/w patient. She asked to have the letter put on her MyChart. She was appreciative. Letter sent to MyChart.

## 2018-02-23 NOTE — Progress Notes (Signed)
Cardiac Individual Treatment Plan  Patient Details  Name: Sherry Torres MRN: [id number] Date of Birth: 1961-04-11 Referring Provider:     Cardiac Rehab from 01/24/2018 in [facility] Cardiac and Pulmonary Rehab  Referring Provider  Kathlyn Sacramento MD [Attending: Dr. Harrell Gave End]      Initial Encounter Date:    Cardiac Rehab from 01/24/2018 in [facility] Cardiac and Pulmonary Rehab  Date  01/24/18      Visit Diagnosis: NSTEMI (non-ST elevated myocardial infarction) [facility])  Patient's Home Medications on Admission:  Current Outpatient Medications:  .  aspirin 81 MG chewable tablet, Chew 1 tablet (81 mg total) by mouth daily., Disp: 30 tablet, Rfl: 2 .  atorvastatin (LIPITOR) 80 MG tablet, Take 1 tablet (80 mg total) by mouth daily at 6 PM., Disp: 90 tablet, Rfl: 3 .  bisoprolol (ZEBETA) 5 MG tablet, Take 1 tablet (5 mg total) by mouth daily., Disp: 90 tablet, Rfl: 3 .  buPROPion (ZYBAN) 150 MG 12 hr tablet, Take 1 tablet (150 mg total) by mouth 2 (two) times daily., Disp: 60 tablet, Rfl: 5 .  clopidogrel (PLAVIX) 75 MG tablet, Take 1 tablet (75 mg total) by mouth daily with breakfast., Disp: 90 tablet, Rfl: 3 .  cyclobenzaprine (FLEXERIL) 5 MG tablet, Take 1 tablet (5 mg total) by mouth 3 (three) times daily as needed for muscle spasms., Disp: 30 tablet, Rfl: 3 .  diclofenac sodium (VOLTAREN) 1 % GEL, Apply 4 g topically 4 (four) times daily., Disp: 100 g, Rfl: 1 .  hydrochlorothiazide (HYDRODIURIL) 25 MG tablet, Take 1 tablet (25 mg total) by mouth daily., Disp: 90 tablet, Rfl: 3 .  isosorbide mononitrate (IMDUR) 60 MG 24 hr tablet, Take 1 tablet (60 mg total) by mouth 2 (two) times daily., Disp: 60 tablet, Rfl: 6 .  metFORMIN (GLUCOPHAGE) 500 MG tablet, Take 0.5 tablets (250 mg total) by mouth 2 (two) times daily with a meal., Disp: 60 tablet, Rfl: 3 .  nitroGLYCERIN (NITROSTAT) 0.4 MG SL tablet, Place 1 tablet (0.4 mg total) under the tongue every 5 (five) minutes as needed for chest pain.,  Disp: 30 tablet, Rfl: 2 .  olmesartan (BENICAR) 5 MG tablet, Take 1 tablet (5 mg total) by mouth daily., Disp: 30 tablet, Rfl: 3 .  traZODone (DESYREL) 50 MG tablet, Take 0.5-1 tablets (25-50 mg total) by mouth at bedtime as needed for sleep. (Patient not taking: Reported on 02/18/2018), Disp: 30 tablet, Rfl: 3 .  VENTOLIN HFA 108 (90 Base) MCG/ACT inhaler, Inhale 1-2 puffs into the lungs daily. (Patient not taking: Reported on 02/18/2018), Disp: 18 Inhaler, Rfl: 2  Past Medical History: Past Medical History:  Diagnosis Date  . Anxiety   . Asthma   . CAD (coronary artery disease)    a. 12/2017 NSTEMI;  b. 12/2017 MV: basal and mid inflat ischemia; c. 12/2017 Cath: LM nl, LAD 30ost, D1 mild dzs, LCX 40ost, 95d, RCA 10m(nondominant). LCX felt to be culprit but only 1.528mvessel-->Med rx.  . Diastolic dysfunction    a. 12/2017 Echo: EF 55-60%, no rwma, Gr1 DD; b. 01/2018 Echo: EF 60-65%, gr2 DD, no rwma, mild MR, nl RV fxn.  . Hyperlipidemia LDL goal <70   . Hypertension   . Recurrent major depressive disorder, in partial remission ([facility] [place]5/07/2017  . Tobacco abuse   . Type 2 diabetes mellitus (HCC)     Tobacco Use: Social History   Tobacco Use  Smoking Status Former Smoker  . Types: Cigarettes  Smokeless Tobacco Never  Used  Tobacco Comment   hasnt smoked in 1 wk    Labs: Recent Review Flowsheet Data    Labs for ITP Cardiac and Pulmonary Rehab Latest Ref Rng & Units 06/03/2017 09/17/2017 01/05/2018 01/06/2018 01/28/2018   Cholestrol 0 - 200 mg/dL 191 - 196 164 -   LDLCALC 0 - 99 mg/dL 118(H) - 113(H) 95 -   HDL >40 mg/dL 48 - 39(L) 33(L) -   Trlycerides <150 mg/dL 127 - 220(H) 182(H) -   Hemoglobin A1c 4.0 - 5.6 % 7.0(H) 6.5 - - 5.7(A)       Exercise Target Goals: Exercise Program Goal: Individual exercise prescription set using results from initial 6 min walk test and THRR while considering  patient's activity barriers and safety.   Exercise Prescription Goal: Initial exercise  prescription builds to 30-45 minutes a day of aerobic activity, 2-3 days per week.  Home exercise guidelines will be given to patient during program as part of exercise prescription that the participant will acknowledge.  Activity Barriers & Risk Stratification: Activity Barriers & Cardiac Risk Stratification - 01/24/18 1201      Activity Barriers & Cardiac Risk Stratification   Activity Barriers  Shortness of Breath;Chest Pain/Angina;Deconditioning;Muscular Weakness    Cardiac Risk Stratification  Moderate       6 Minute Walk: 6 Minute Walk    Row Name 01/24/18 1222         6 Minute Walk   Phase  Initial     Distance  1045 feet     Walk Time  6 minutes     # of Rest Breaks  0     MPH  1.98     METS  2.68     RPE  12     VO2 Peak  9.37     Symptoms  Yes (comment)     Comments  chest tightness at end 1/10     Resting HR  56 bpm     Resting BP  124/66     Resting Oxygen Saturation   97 %     Exercise Oxygen Saturation  during 6 min walk  99 %     Max Ex. HR  90 bpm     Max Ex. BP  146/74     2 Minute Post BP  124/70        Oxygen Initial Assessment:   Oxygen Re-Evaluation:   Oxygen Discharge (Final Oxygen Re-Evaluation):   Initial Exercise Prescription: Initial Exercise Prescription - 01/24/18 1200      Date of Initial Exercise RX and Referring Provider   Date  01/24/18    Referring Provider  Kathlyn Sacramento MD   Attending: Dr. Harrell Gave End     Treadmill   MPH  1.8    Grade  1    Minutes  15    METs  2.63      NuStep   Level  2    SPM  80    Minutes  15    METs  2.5      Recumbant Elliptical   Level  1    RPM  50    Minutes  15    METs  2.5      Prescription Details   Frequency (times per week)  3    Duration  Progress to 45 minutes of aerobic exercise without signs/symptoms of physical distress      Intensity   THRR 40-80% of Max Heartrate  99-142  Ratings of Perceived Exertion  11-13    Perceived Dyspnea  0-4      Progression    Progression  Continue to progress workloads to maintain intensity without signs/symptoms of physical distress.      Resistance Training   Training Prescription  Yes    Weight  3 lbs    Reps  10-15       Perform Capillary Blood Glucose checks as needed.  Exercise Prescription Changes: Exercise Prescription Changes    Row Name 01/24/18 1200 02/02/18 1400           Response to Exercise   Blood Pressure (Admit)  124/66  110/60      Blood Pressure (Exercise)  146/74  130/80      Blood Pressure (Exit)  124/70  130/82      Heart Rate (Admit)  56 bpm  87 bpm      Heart Rate (Exercise)  90 bpm  100 bpm      Heart Rate (Exit)  58 bpm  75 bpm      Oxygen Saturation (Admit)  97 %  -      Oxygen Saturation (Exercise)  99 %  -      Rating of Perceived Exertion (Exercise)  12  13      Symptoms  chest tightness at end 1/10  none during exercise      Comments  walk test results  -      Duration  -  Progress to 45 minutes of aerobic exercise without signs/symptoms of physical distress      Intensity  -  THRR unchanged        Progression   Progression  -  Continue to progress workloads to maintain intensity without signs/symptoms of physical distress.      Average METs  -  2.1        Resistance Training   Training Prescription  -  Yes      Weight  -  3 lb      Reps  -  10-15        NuStep   Level  -  2      SPM  -  80      Minutes  -  15        Recumbant Elliptical   Level  -  1      RPM  -  50      Minutes  -  15      METs  -  1.4         Exercise Comments: Exercise Comments    Row Name 01/26/18 1753           Exercise Comments   First full day of exercise!  Patient was oriented to gym and equipment including functions, settings, policies, and procedures.  Patient's individual exercise prescription and treatment plan were reviewed.  All starting workloads were established based on the results of the 6 minute walk test done at initial orientation visit.  The plan for  exercise progression was also introduced and progression will be customized based on patient's performance and goals.          Exercise Goals and Review: Exercise Goals    Row Name 01/24/18 1233             Exercise Goals   Increase Physical Activity  Yes       Intervention  Provide advice, education, support and counseling about physical activity/exercise needs.;Develop  an individualized exercise prescription for aerobic and resistive training based on initial evaluation findings, risk stratification, comorbidities and participant's personal goals.       Expected Outcomes  Short Term: Attend rehab on a regular basis to increase amount of physical activity.;Long Term: Add in home exercise to make exercise part of routine and to increase amount of physical activity.;Long Term: Exercising regularly at least 3-5 days a week.       Increase Strength and Stamina  Yes       Intervention  Provide advice, education, support and counseling about physical activity/exercise needs.;Develop an individualized exercise prescription for aerobic and resistive training based on initial evaluation findings, risk stratification, comorbidities and participant's personal goals.       Expected Outcomes  Short Term: Increase workloads from initial exercise prescription for resistance, speed, and METs.;Short Term: Perform resistance training exercises routinely during rehab and add in resistance training at home;Long Term: Improve cardiorespiratory fitness, muscular endurance and strength as measured by increased METs and functional capacity (6MWT)       Able to understand and use rate of perceived exertion (RPE) scale  Yes       Intervention  Provide education and explanation on how to use RPE scale       Expected Outcomes  Short Term: Able to use RPE daily in rehab to express subjective intensity level;Long Term:  Able to use RPE to guide intensity level when exercising independently       Knowledge and understanding  of Target Heart Rate Range (THRR)  Yes       Intervention  Provide education and explanation of THRR including how the numbers were predicted and where they are located for reference       Expected Outcomes  Short Term: Able to state/look up THRR;Short Term: Able to use daily as guideline for intensity in rehab;Long Term: Able to use THRR to govern intensity when exercising independently       Able to check pulse independently  Yes       Intervention  Provide education and demonstration on how to check pulse in carotid and radial arteries.;Review the importance of being able to check your own pulse for safety during independent exercise       Expected Outcomes  Short Term: Able to explain why pulse checking is important during independent exercise;Long Term: Able to check pulse independently and accurately       Understanding of Exercise Prescription  Yes       Intervention  Provide education, explanation, and written materials on patient's individual exercise prescription       Expected Outcomes  Short Term: Able to explain program exercise prescription;Long Term: Able to explain home exercise prescription to exercise independently          Exercise Goals Re-Evaluation : Exercise Goals Re-Evaluation    Row Name 01/26/18 1753 02/02/18 1450           Exercise Goal Re-Evaluation   Exercise Goals Review  Increase Physical Activity;Able to understand and use rate of perceived exertion (RPE) scale;Knowledge and understanding of Target Heart Rate Range (THRR);Understanding of Exercise Prescription;Increase Strength and Stamina  Increase Physical Activity;Increase Strength and Stamina;Able to understand and use rate of perceived exertion (RPE) scale      Comments  Reviewed RPE scale, THR and program prescription with pt today.  Pt voiced understanding and was given a copy of goals to take home.   Jolissa is new to exercise and has had difficulty  coordinating the REL, so hasnt reached target HR/MET  level for that reason.  Staff will monitor progress.      Expected Outcomes  Short: Use RPE daily to regulate intensity. Long: Follow program prescription in THR.  Short - attend consistently 3 days per week Long - increase MET level         Discharge Exercise Prescription (Final Exercise Prescription Changes): Exercise Prescription Changes - 02/02/18 1400      Response to Exercise   Blood Pressure (Admit)  110/60    Blood Pressure (Exercise)  130/80    Blood Pressure (Exit)  130/82    Heart Rate (Admit)  87 bpm    Heart Rate (Exercise)  100 bpm    Heart Rate (Exit)  75 bpm    Rating of Perceived Exertion (Exercise)  13    Symptoms  none during exercise    Duration  Progress to 45 minutes of aerobic exercise without signs/symptoms of physical distress    Intensity  THRR unchanged      Progression   Progression  Continue to progress workloads to maintain intensity without signs/symptoms of physical distress.    Average METs  2.1      Resistance Training   Training Prescription  Yes    Weight  3 lb    Reps  10-15      NuStep   Level  2    SPM  80    Minutes  15      Recumbant Elliptical   Level  1    RPM  50    Minutes  15    METs  1.4       Nutrition:  Target Goals: Understanding of nutrition guidelines, daily intake of sodium '1500mg'$ , cholesterol '200mg'$ , calories 30% from fat and 7% or less from saturated fats, daily to have 5 or more servings of fruits and vegetables.  Biometrics: Pre Biometrics - 01/24/18 1233      Pre Biometrics   Height  5' 1.8" (1.57 m)    Weight  192 lb 1.6 oz (87.1 kg)    Waist Circumference  40 inches    Hip Circumference  45.5 inches    Waist to Hip Ratio  0.88 %    BMI (Calculated)  35.35    Single Leg Stand  16.86 seconds        Nutrition Therapy Plan and Nutrition Goals: Nutrition Therapy & Goals - 01/24/18 1213      Intervention Plan   Intervention  Prescribe, educate and counsel regarding individualized specific dietary  modifications aiming towards targeted core components such as weight, hypertension, lipid management, diabetes, heart failure and other comorbidities.;Nutrition handout(s) given to patient.    Expected Outcomes  Short Term Goal: Understand basic principles of dietary content, such as calories, fat, sodium, cholesterol and nutrients.;Long Term Goal: Adherence to prescribed nutrition plan.;Short Term Goal: A plan has been developed with personal nutrition goals set during dietitian appointment.       Nutrition Assessments: Nutrition Assessments - 01/24/18 1213      MEDFICTS Scores   Pre Score  111       Nutrition Goals Re-Evaluation:   Nutrition Goals Discharge (Final Nutrition Goals Re-Evaluation):   Psychosocial: Target Goals: Acknowledge presence or absence of significant depression and/or stress, maximize coping skills, provide positive support system. Participant is able to verbalize types and ability to use techniques and skills needed for reducing stress and depression.   Initial Review & Psychosocial Screening: Initial Psych  Review & Screening - 01/24/18 1208      Initial Review   Current issues with  Current Sleep Concerns;Current Anxiety/Panic;Current Stress Concerns    Source of Stress Concerns  Unable to participate in former interests or hobbies;Unable to perform yard/household activities    Comments  Currently waking up with chest discomfort/ questionable acid reflux during the night. It is scary for her. Sometimes she takes Nitro, other times she just waits. She is not back at work yet. She works at a [employer].       Family Dynamics   Good Support System?  Yes   spouse, family, friends     Barriers   Psychosocial barriers to participate in program  There are no identifiable barriers or psychosocial needs.;The patient should benefit from training in stress management and relaxation.      Screening Interventions   Interventions  Encouraged to exercise;Program  counselor consult;To provide support and resources with identified psychosocial needs;Provide feedback about the scores to participant    Expected Outcomes  Short Term goal: Utilizing psychosocial counselor, staff and physician to assist with identification of specific Stressors or current issues interfering with healing process. Setting desired goal for each stressor or current issue identified.;Long Term Goal: Stressors or current issues are controlled or eliminated.;Short Term goal: Identification and review with participant of any Quality of Life or Depression concerns found by scoring the questionnaire.;Long Term goal: The participant improves quality of Life and PHQ9 Scores as seen by post scores and/or verbalization of changes       Quality of Life Scores:  Quality of Life - 01/24/18 1209      Quality of Life   Select  Quality of Life      Quality of Life Scores   Health/Function Pre  20 %    Socioeconomic Pre  24 %    Psych/Spiritual Pre  22.29 %    Family Pre  20.4 %    GLOBAL Pre  21.35 %      Scores of 19 and below usually indicate a poorer quality of life in these areas.  A difference of  2-3 points is a clinically meaningful difference.  A difference of 2-3 points in the total score of the Quality of Life Index has been associated with significant improvement in overall quality of life, self-image, physical symptoms, and general health in studies assessing change in quality of life.  PHQ-9: Recent Review Flowsheet Data    Depression screen [facility] 2/9 01/28/2018 01/24/2018 01/11/2018 01/03/2018 09/17/2017   Decreased Interest 0 2 0 0 0   Down, Depressed, Hopeless 0 1 0 0 0   PHQ - 2 Score 0 3 0 0 0   Altered sleeping 0 3 - - -   Tired, decreased energy 0 1 - - -   Change in appetite 0 1 - - -   Feeling bad or failure about yourself  0 0 - - -   Trouble concentrating 0 0 - - -   Moving slowly or fidgety/restless 0 0 - - -   Suicidal thoughts 0 0 - - -   PHQ-9 Score 0 8 - - -    Difficult doing work/chores - Somewhat difficult - - -     Interpretation of Total Score  Total Score Depression Severity:  1-4 = Minimal depression, 5-9 = Mild depression, 10-14 = Moderate depression, 15-19 = Moderately severe depression, 20-27 = Severe depression   Psychosocial Evaluation and Intervention: Psychosocial Evaluation - 01/31/18  1712      Psychosocial Evaluation & Interventions   Interventions  Encouraged to exercise with the program and follow exercise prescription    Comments  Counselor met with Ms. Grace Clarene Critchley) today for initial psychosocial evaluation.  She is a 57 year old who had a heart attack on 8/20 and is being treated with diet, exercise and Rx.  She has a strong support system with a spouse of 20+ years; several daughters and a sister who lives locally.  She is in fairly good health other than her heart and diabetes.  Junelle reports not sleeping well due to pain since surgery and told the nurse this today to inform the Dr.  She has a good appetite.  Avrianna denies a history of depression or anxiety although she admits to increased anxiety since the surgery and has been on Wellbutrin over 10 years.  Rilee states she is typically in a positive mood most of the time.  She reports other than her health, and not being able to go to work yet, she has minimal stress in her life.  Kaiyla has goals to eat better; exercise more and get back to her normal life.  Staff will follow with her.    Expected Outcomes  Short:  Eira will exercise to improve her health and mental health.  She will hopefully improve her sleep from exercising consistently as well.  Long:  Kima will develop a routine of positive self-care strategies to maintain her health and mental health.    Continue Psychosocial Services   Follow up required by staff       Psychosocial Re-Evaluation:   Psychosocial Discharge (Final Psychosocial Re-Evaluation):   Vocational Rehabilitation: Provide  vocational rehab assistance to qualifying candidates.   Vocational Rehab Evaluation & Intervention: Vocational Rehab - 01/24/18 1207      Initial Vocational Rehab Evaluation & Intervention   Assessment shows need for Vocational Rehabilitation  No       Education: Education Goals: Education classes will be provided on a variety of topics geared toward better understanding of heart health and risk factor modification. Participant will state understanding/return demonstration of topics presented as noted by education test scores.  Learning Barriers/Preferences: Learning Barriers/Preferences - 01/24/18 1205      Learning Barriers/Preferences   Learning Barriers  Exercise Concerns    Learning Preferences  None       Education Topics:  AED/CPR: - Group verbal and written instruction with the use of models to demonstrate the basic use of the AED with the basic ABC's of resuscitation.   Cardiac Rehab from 01/31/2018 in [facility] Cardiac and Pulmonary Rehab  Date  01/31/18  Educator  CE  Instruction Review Code  1- Verbalizes Understanding      General Nutrition Guidelines/Fats and Fiber: -Group instruction provided by verbal, written material, models and posters to present the general guidelines for heart healthy nutrition. Gives an explanation and review of dietary fats and fiber.   Controlling Sodium/Reading Food Labels: -Group verbal and written material supporting the discussion of sodium use in heart healthy nutrition. Review and explanation with models, verbal and written materials for utilization of the food label.   Exercise Physiology & General Exercise Guidelines: - Group verbal and written instruction with models to review the exercise physiology of the cardiovascular system and associated critical values. Provides general exercise guidelines with specific guidelines to those with heart or lung disease.    Aerobic Exercise & Resistance Training: - Gives group verbal and  written  instruction on the various components of exercise. Focuses on aerobic and resistive training programs and the benefits of this training and how to safely progress through these programs..   Flexibility, Balance, Mind/Body Relaxation: Provides group verbal/written instruction on the benefits of flexibility and balance training, including mind/body exercise modes such as yoga, pilates and tai chi.  Demonstration and skill practice provided.   Stress and Anxiety: - Provides group verbal and written instruction about the health risks of elevated stress and causes of high stress.  Discuss the correlation between heart/lung disease and anxiety and treatment options. Review healthy ways to manage with stress and anxiety.   Depression: - Provides group verbal and written instruction on the correlation between heart/lung disease and depressed mood, treatment options, and the stigmas associated with seeking treatment.   Anatomy & Physiology of the Heart: - Group verbal and written instruction and models provide basic cardiac anatomy and physiology, with the coronary electrical and arterial systems. Review of Valvular disease and Heart Failure   Cardiac Procedures: - Group verbal and written instruction to review commonly prescribed medications for heart disease. Reviews the medication, class of the drug, and side effects. Includes the steps to properly store meds and maintain the prescription regimen. (beta blockers and nitrates)   Cardiac Rehab from 01/31/2018 in [facility] Cardiac and Pulmonary Rehab  Date  01/26/18  Educator  [facility]  Instruction Review Code  1- Verbalizes Understanding      Cardiac Medications I: - Group verbal and written instruction to review commonly prescribed medications for heart disease. Reviews the medication, class of the drug, and side effects. Includes the steps to properly store meds and maintain the prescription regimen.   Cardiac Medications II: -Group verbal and  written instruction to review commonly prescribed medications for heart disease. Reviews the medication, class of the drug, and side effects. (all other drug classes)    Go Sex-Intimacy & Heart Disease, Get SMART - Goal Setting: - Group verbal and written instruction through game format to discuss heart disease and the return to sexual intimacy. Provides group verbal and written material to discuss and apply goal setting through the application of the S.M.A.R.T. Method.   Cardiac Rehab from 01/31/2018 in [facility] Cardiac and Pulmonary Rehab  Date  01/26/18  Educator  [facility]  Instruction Review Code  1- Verbalizes Understanding      Other Matters of the Heart: - Provides group verbal, written materials and models to describe Stable Angina and Peripheral Artery. Includes description of the disease process and treatment options available to the cardiac patient.   Exercise & Equipment Safety: - Individual verbal instruction and demonstration of equipment use and safety with use of the equipment.   Cardiac Rehab from 01/31/2018 in [facility] Cardiac and Pulmonary Rehab  Date  01/24/18  Educator  [facility]  Instruction Review Code  1- Verbalizes Understanding      Infection Prevention: - Provides verbal and written material to individual with discussion of infection control including proper hand washing and proper equipment cleaning during exercise session.   Cardiac Rehab from 01/31/2018 in [facility] Cardiac and Pulmonary Rehab  Date  01/24/18  Educator  [facility]  Instruction Review Code  1- Verbalizes Understanding      Falls Prevention: - Provides verbal and written material to individual with discussion of falls prevention and safety.   Cardiac Rehab from 01/31/2018 in [facility] Cardiac and Pulmonary Rehab  Date  01/24/18  Educator  [facility]  Instruction Review Code  1- Verbalizes Understanding  Diabetes: - Individual verbal and written instruction to review signs/symptoms of diabetes, desired ranges of glucose  level fasting, after meals and with exercise. Acknowledge that pre and post exercise glucose checks will be done for 3 sessions at entry of program.   Cardiac Rehab from 01/31/2018 in [facility] Cardiac and Pulmonary Rehab  Date  01/24/18  Educator  [facility]  Instruction Review Code  1- Verbalizes Understanding      Know Your Numbers and Risk Factors: -Group verbal and written instruction about important numbers in your health.  Discussion of what are risk factors and how they play a role in the disease process.  Review of Cholesterol, Blood Pressure, Diabetes, and BMI and the role they play in your overall health.   Sleep Hygiene: -Provides group verbal and written instruction about how sleep can affect your health.  Define sleep hygiene, discuss sleep cycles and impact of sleep habits. Review good sleep hygiene tips.    Other: -Provides group and verbal instruction on various topics (see comments)   Knowledge Questionnaire Score: Knowledge Questionnaire Score - 01/24/18 1205      Knowledge Questionnaire Score   Pre Score  22/26   correct answers reviewed with Clarene Critchley, focus on exercise and nutrition      Core Components/Risk Factors/Patient Goals at Admission: Personal Goals and Risk Factors at Admission - 01/24/18 1203      Core Components/Risk Factors/Patient Goals on Admission    Weight Management  Yes;Obesity;Weight Loss    Intervention  Weight Management: Develop a combined nutrition and exercise program designed to reach desired caloric intake, while maintaining appropriate intake of nutrient and fiber, sodium and fats, and appropriate energy expenditure required for the weight goal.;Weight Management: Provide education and appropriate resources to help participant work on and attain dietary goals.;Weight Management/Obesity: Establish reasonable short term and long term weight goals.;Obesity: Provide education and appropriate resources to help participant work on and attain dietary  goals.    Admit Weight  191 lb (86.6 kg)    Goal Weight: Short Term  187 lb (84.8 kg)    Goal Weight: Long Term  140 lb (63.5 kg)    Expected Outcomes  Short Term: Continue to assess and modify interventions until short term weight is achieved;Long Term: Adherence to nutrition and physical activity/exercise program aimed toward attainment of established weight goal;Weight Loss: Understanding of general recommendations for a balanced deficit meal plan, which promotes 1-2 lb weight loss per week and includes a negative energy balance of [phone] kcal/d;Understanding recommendations for meals to include 15-35% energy as protein, 25-35% energy from fat, 35-60% energy from carbohydrates, less than '200mg'$  of dietary cholesterol, 20-35 gm of total fiber daily;Understanding of distribution of calorie intake throughout the day with the consumption of 4-5 meals/snacks    Tobacco Cessation  Yes   quit 01/10/18   Intervention  Assist the participant in steps to quit. Provide individualized education and counseling about committing to Tobacco Cessation, relapse prevention, and pharmacological support that can be provided by physician.;Advice worker, assist with locating and accessing local/national Quit Smoking programs, and support quit date choice.    Expected Outcomes  Short Term: Will quit all tobacco product use, adhering to prevention of relapse plan.;Long Term: Complete abstinence from all tobacco products for at least 12 months from quit date.    Diabetes  Yes    Intervention  Provide education about signs/symptoms and action to take for hypo/hyperglycemia.;Provide education about proper nutrition, including hydration, and aerobic/resistive exercise prescription along with prescribed  medications to achieve blood glucose in normal ranges: Fasting glucose 65-99 mg/dL    Expected Outcomes  Short Term: Participant verbalizes understanding of the signs/symptoms and immediate care of  hyper/hypoglycemia, proper foot care and importance of medication, aerobic/resistive exercise and nutrition plan for blood glucose control.;Long Term: Attainment of HbA1C < 7%.    Hypertension  Yes    Intervention  Provide education on lifestyle modifcations including regular physical activity/exercise, weight management, moderate sodium restriction and increased consumption of fresh fruit, vegetables, and low fat dairy, alcohol moderation, and smoking cessation.;Monitor prescription use compliance.    Expected Outcomes  Short Term: Continued assessment and intervention until BP is < 140/41m HG in hypertensive participants. < 130/828mHG in hypertensive participants with diabetes, heart failure or chronic kidney disease.;Long Term: Maintenance of blood pressure at goal levels.    Lipids  Yes    Intervention  Provide education and support for participant on nutrition & aerobic/resistive exercise along with prescribed medications to achieve LDL '70mg'$ , HDL >'40mg'$ .    Expected Outcomes  Short Term: Participant states understanding of desired cholesterol values and is compliant with medications prescribed. Participant is following exercise prescription and nutrition guidelines.;Long Term: Cholesterol controlled with medications as prescribed, with individualized exercise RX and with personalized nutrition plan. Value goals: LDL < '70mg'$ , HDL > 40 mg.       Core Components/Risk Factors/Patient Goals Review:  Goals and Risk Factor Review    Row Name 01/31/18 1820             Core Components/Risk Factors/Patient Goals Review   Review  ThBrianny/o at night after dinner she has chest pain. I asked her to contact her Cardiologist. She does not have chest pain or any chest discomfort s/s exercising in Cardiac Rehab. I faxed an FYI to her Cardiologist.        Expected Outcomes  Free of chest pain or s/s.          Core Components/Risk Factors/Patient Goals at Discharge (Final Review):  Goals and Risk Factor  Review - 01/31/18 1820      Core Components/Risk Factors/Patient Goals Review   Review  ThAlexee/o at night after dinner she has chest pain. I asked her to contact her Cardiologist. She does not have chest pain or any chest discomfort s/s exercising in Cardiac Rehab. I faxed an FYI to her Cardiologist.     Expected Outcomes  Free of chest pain or s/s.       ITP Comments: ITP Comments    Row Name 01/24/18 1155 01/26/18 0557 02/02/18 1554 02/23/18 0748     ITP Comments  Med Review completed. Initial ITP created. [facility]  30 day review completed. ITP sent to Dr. MaEmily FilbertMedical Director of Cardiac Rehab. Continue with ITP unless changes are made by physician  New to program  ThMonserrattalled and said she will not exercise today since she is scheduled for an echo due to her c/o chest pressure approx 2 hours after supper.  30 day review completed. ITP sent to Dr. MaEmily FilbertMedical Director of Cardiac Rehab. Continue with ITP unless changes are made by physician       Comments:

## 2018-02-25 ENCOUNTER — Ambulatory Visit: Payer: BLUE CROSS/BLUE SHIELD | Admitting: Internal Medicine

## 2018-03-03 ENCOUNTER — Telehealth: Payer: Self-pay

## 2018-03-03 NOTE — Telephone Encounter (Signed)
Sherry Torres has had some medication adjustments - she has to figure out coming to class with work schedule.  She works 9-5.  She will call back when she talks to them at work.  She may switch to 7:30 am class

## 2018-03-07 ENCOUNTER — Other Ambulatory Visit: Payer: Self-pay

## 2018-03-07 MED ORDER — BISOPROLOL FUMARATE 5 MG PO TABS: 5.0000 mg | ORAL_TABLET | Freq: Every day | ORAL | 0 refills | Status: DC

## 2018-03-07 NOTE — Telephone Encounter (Signed)
*  STAT* If patient is at the pharmacy, call can be transferred to refill team.   1. Which medications need to be refilled? (please list name of each medication and dose if known) Bisoprolol  2. Which pharmacy/location (including street and city if local pharmacy) is medication to be sent to? [facility] [place]  3. Do they need a 30 day or 90 day supply? 90

## 2018-03-15 ENCOUNTER — Other Ambulatory Visit: Payer: Self-pay

## 2018-03-15 DIAGNOSIS — F3341 Major depressive disorder, recurrent, in partial remission: Secondary | ICD-10-CM

## 2018-03-15 MED ORDER — BUPROPION HCL ER (SMOKING DET) 150 MG PO TB12: 150.0000 mg | ORAL_TABLET | Freq: Two times a day (BID) | ORAL | 5 refills | Status: DC

## 2018-03-16 ENCOUNTER — Encounter: Payer: Self-pay | Admitting: *Deleted

## 2018-03-16 NOTE — Progress Notes (Signed)
Cardiac Individual Treatment Plan  Patient Details  Name: Sherry Torres MRN: [id number] Date of Birth: 16-Jul-1960 Referring Provider:     Cardiac Rehab from 01/24/2018 in [facility] Cardiac and Pulmonary Rehab  Referring Provider  Kathlyn Sacramento MD [Attending: Dr. Harrell Gave End]      Initial Encounter Date:    Cardiac Rehab from 01/24/2018 in [facility] Cardiac and Pulmonary Rehab  Date  01/24/18      Visit Diagnosis: No diagnosis found.  Patient's Home Medications on Admission:  Current Outpatient Medications:  .  aspirin 81 MG chewable tablet, Chew 1 tablet (81 mg total) by mouth daily., Disp: 30 tablet, Rfl: 2 .  atorvastatin (LIPITOR) 80 MG tablet, Take 1 tablet (80 mg total) by mouth daily at 6 PM., Disp: 90 tablet, Rfl: 3 .  bisoprolol (ZEBETA) 5 MG tablet, Take 1 tablet (5 mg total) by mouth daily., Disp: 90 tablet, Rfl: 0 .  buPROPion (ZYBAN) 150 MG 12 hr tablet, Take 1 tablet (150 mg total) by mouth 2 (two) times daily., Disp: 60 tablet, Rfl: 5 .  clopidogrel (PLAVIX) 75 MG tablet, Take 1 tablet (75 mg total) by mouth daily with breakfast., Disp: 90 tablet, Rfl: 3 .  cyclobenzaprine (FLEXERIL) 5 MG tablet, Take 1 tablet (5 mg total) by mouth 3 (three) times daily as needed for muscle spasms., Disp: 30 tablet, Rfl: 3 .  diclofenac sodium (VOLTAREN) 1 % GEL, Apply 4 g topically 4 (four) times daily., Disp: 100 g, Rfl: 1 .  hydrochlorothiazide (HYDRODIURIL) 25 MG tablet, Take 1 tablet (25 mg total) by mouth daily., Disp: 90 tablet, Rfl: 3 .  isosorbide mononitrate (IMDUR) 60 MG 24 hr tablet, Take 1 tablet (60 mg total) by mouth 2 (two) times daily., Disp: 60 tablet, Rfl: 6 .  metFORMIN (GLUCOPHAGE) 500 MG tablet, Take 0.5 tablets (250 mg total) by mouth 2 (two) times daily with a meal., Disp: 60 tablet, Rfl: 3 .  nitroGLYCERIN (NITROSTAT) 0.4 MG SL tablet, Place 1 tablet (0.4 mg total) under the tongue every 5 (five) minutes as needed for chest pain., Disp: 30 tablet, Rfl: 2 .   olmesartan (BENICAR) 5 MG tablet, Take 1 tablet (5 mg total) by mouth daily., Disp: 30 tablet, Rfl: 3 .  traZODone (DESYREL) 50 MG tablet, Take 0.5-1 tablets (25-50 mg total) by mouth at bedtime as needed for sleep. (Patient not taking: Reported on 02/18/2018), Disp: 30 tablet, Rfl: 3 .  VENTOLIN HFA 108 (90 Base) MCG/ACT inhaler, Inhale 1-2 puffs into the lungs daily. (Patient not taking: Reported on 02/18/2018), Disp: 18 Inhaler, Rfl: 2  Past Medical History: Past Medical History:  Diagnosis Date  . Anxiety   . Asthma   . CAD (coronary artery disease)    a. 12/2017 NSTEMI;  b. 12/2017 MV: basal and mid inflat ischemia; c. 12/2017 Cath: LM nl, LAD 30ost, D1 mild dzs, LCX 40ost, 95d, RCA 73m(nondominant). LCX felt to be culprit but only 1.555mvessel-->Med rx.  . Diastolic dysfunction    a. 12/2017 Echo: EF 55-60%, no rwma, Gr1 DD; b. 01/2018 Echo: EF 60-65%, gr2 DD, no rwma, mild MR, nl RV fxn.  . Hyperlipidemia LDL goal <70   . Hypertension   . Recurrent major depressive disorder, in partial [place]  . Tobacco abuse   . Type 2 diabetes mellitus (HCC)     Tobacco Use: Social History   Tobacco Use  Smoking Status Former Smoker  . Types: Cigarettes  Smokeless Tobacco Never Used  Tobacco  Comment   hasnt smoked in 1 wk    Labs: Recent Review Flowsheet Data    Labs for ITP Cardiac and Pulmonary Rehab Latest Ref Rng & Units 06/03/2017 09/17/2017 01/05/2018 01/06/2018 01/28/2018   Cholestrol 0 - 200 mg/dL 191 - 196 164 -   LDLCALC 0 - 99 mg/dL 118(H) - 113(H) 95 -   HDL >40 mg/dL 48 - 39(L) 33(L) -   Trlycerides <150 mg/dL 127 - 220(H) 182(H) -   Hemoglobin A1c 4.0 - 5.6 % 7.0(H) 6.5 - - 5.7(A)       Exercise Target Goals: Exercise Program Goal: Individual exercise prescription set using results from initial 6 min walk test and THRR while considering  patient's activity barriers and safety.   Exercise Prescription Goal: Initial exercise prescription builds to 30-45  minutes a day of aerobic activity, 2-3 days per week.  Home exercise guidelines will be given to patient during program as part of exercise prescription that the participant will acknowledge.  Activity Barriers & Risk Stratification: Activity Barriers & Cardiac Risk Stratification - 01/24/18 1201      Activity Barriers & Cardiac Risk Stratification   Activity Barriers  Shortness of Breath;Chest Pain/Angina;Deconditioning;Muscular Weakness    Cardiac Risk Stratification  Moderate       6 Minute Walk: 6 Minute Walk    Row Name 01/24/18 1222         6 Minute Walk   Phase  Initial     Distance  1045 feet     Walk Time  6 minutes     # of Rest Breaks  0     MPH  1.98     METS  2.68     RPE  12     VO2 Peak  9.37     Symptoms  Yes (comment)     Comments  chest tightness at end 1/10     Resting HR  56 bpm     Resting BP  124/66     Resting Oxygen Saturation   97 %     Exercise Oxygen Saturation  during 6 min walk  99 %     Max Ex. HR  90 bpm     Max Ex. BP  146/74     2 Minute Post BP  124/70        Oxygen Initial Assessment:   Oxygen Re-Evaluation:   Oxygen Discharge (Final Oxygen Re-Evaluation):   Initial Exercise Prescription: Initial Exercise Prescription - 01/24/18 1200      Date of Initial Exercise RX and Referring Provider   Date  01/24/18    Referring Provider  Kathlyn Sacramento MD   Attending: Dr. Harrell Gave End     Treadmill   MPH  1.8    Grade  1    Minutes  15    METs  2.63      NuStep   Level  2    SPM  80    Minutes  15    METs  2.5      Recumbant Elliptical   Level  1    RPM  50    Minutes  15    METs  2.5      Prescription Details   Frequency (times per week)  3    Duration  Progress to 45 minutes of aerobic exercise without signs/symptoms of physical distress      Intensity   THRR 40-80% of Max Heartrate  99-142    Ratings of  Perceived Exertion  11-13    Perceived Dyspnea  0-4      Progression   Progression  Continue to  progress workloads to maintain intensity without signs/symptoms of physical distress.      Resistance Training   Training Prescription  Yes    Weight  3 lbs    Reps  10-15       Perform Capillary Blood Glucose checks as needed.  Exercise Prescription Changes: Exercise Prescription Changes    Row Name 01/24/18 1200 02/02/18 1400           Response to Exercise   Blood Pressure (Admit)  124/66  110/60      Blood Pressure (Exercise)  146/74  130/80      Blood Pressure (Exit)  124/70  130/82      Heart Rate (Admit)  56 bpm  87 bpm      Heart Rate (Exercise)  90 bpm  100 bpm      Heart Rate (Exit)  58 bpm  75 bpm      Oxygen Saturation (Admit)  97 %  -      Oxygen Saturation (Exercise)  99 %  -      Rating of Perceived Exertion (Exercise)  12  13      Symptoms  chest tightness at end 1/10  none during exercise      Comments  walk test results  -      Duration  -  Progress to 45 minutes of aerobic exercise without signs/symptoms of physical distress      Intensity  -  THRR unchanged        Progression   Progression  -  Continue to progress workloads to maintain intensity without signs/symptoms of physical distress.      Average METs  -  2.1        Resistance Training   Training Prescription  -  Yes      Weight  -  3 lb      Reps  -  10-15        NuStep   Level  -  2      SPM  -  80      Minutes  -  15        Recumbant Elliptical   Level  -  1      RPM  -  50      Minutes  -  15      METs  -  1.4         Exercise Comments: Exercise Comments    Row Name 01/26/18 1753           Exercise Comments   First full day of exercise!  Patient was oriented to gym and equipment including functions, settings, policies, and procedures.  Patient's individual exercise prescription and treatment plan were reviewed.  All starting workloads were established based on the results of the 6 minute walk test done at initial orientation visit.  The plan for exercise progression was also  introduced and progression will be customized based on patient's performance and goals.          Exercise Goals and Review: Exercise Goals    Row Name 01/24/18 1233             Exercise Goals   Increase Physical Activity  Yes       Intervention  Provide advice, education, support and counseling about physical activity/exercise needs.;Develop an individualized  exercise prescription for aerobic and resistive training based on initial evaluation findings, risk stratification, comorbidities and participant's personal goals.       Expected Outcomes  Short Term: Attend rehab on a regular basis to increase amount of physical activity.;Long Term: Add in home exercise to make exercise part of routine and to increase amount of physical activity.;Long Term: Exercising regularly at least 3-5 days a week.       Increase Strength and Stamina  Yes       Intervention  Provide advice, education, support and counseling about physical activity/exercise needs.;Develop an individualized exercise prescription for aerobic and resistive training based on initial evaluation findings, risk stratification, comorbidities and participant's personal goals.       Expected Outcomes  Short Term: Increase workloads from initial exercise prescription for resistance, speed, and METs.;Short Term: Perform resistance training exercises routinely during rehab and add in resistance training at home;Long Term: Improve cardiorespiratory fitness, muscular endurance and strength as measured by increased METs and functional capacity (6MWT)       Able to understand and use rate of perceived exertion (RPE) scale  Yes       Intervention  Provide education and explanation on how to use RPE scale       Expected Outcomes  Short Term: Able to use RPE daily in rehab to express subjective intensity level;Long Term:  Able to use RPE to guide intensity level when exercising independently       Knowledge and understanding of Target Heart Rate Range  (THRR)  Yes       Intervention  Provide education and explanation of THRR including how the numbers were predicted and where they are located for reference       Expected Outcomes  Short Term: Able to state/look up THRR;Short Term: Able to use daily as guideline for intensity in rehab;Long Term: Able to use THRR to govern intensity when exercising independently       Able to check pulse independently  Yes       Intervention  Provide education and demonstration on how to check pulse in carotid and radial arteries.;Review the importance of being able to check your own pulse for safety during independent exercise       Expected Outcomes  Short Term: Able to explain why pulse checking is important during independent exercise;Long Term: Able to check pulse independently and accurately       Understanding of Exercise Prescription  Yes       Intervention  Provide education, explanation, and written materials on patient's individual exercise prescription       Expected Outcomes  Short Term: Able to explain program exercise prescription;Long Term: Able to explain home exercise prescription to exercise independently          Exercise Goals Re-Evaluation : Exercise Goals Re-Evaluation    Row Name 01/26/18 1753 02/02/18 1450           Exercise Goal Re-Evaluation   Exercise Goals Review  Increase Physical Activity;Able to understand and use rate of perceived exertion (RPE) scale;Knowledge and understanding of Target Heart Rate Range (THRR);Understanding of Exercise Prescription;Increase Strength and Stamina  Increase Physical Activity;Increase Strength and Stamina;Able to understand and use rate of perceived exertion (RPE) scale      Comments  Reviewed RPE scale, THR and program prescription with pt today.  Pt voiced understanding and was given a copy of goals to take home.   Yoshiye is new to exercise and has had difficulty coordinating the  REL, so hasnt reached target HR/MET level for that reason.  Staff  will monitor progress.      Expected Outcomes  Short: Use RPE daily to regulate intensity. Long: Follow program prescription in THR.  Short - attend consistently 3 days per week Long - increase MET level         Discharge Exercise Prescription (Final Exercise Prescription Changes): Exercise Prescription Changes - 02/02/18 1400      Response to Exercise   Blood Pressure (Admit)  110/60    Blood Pressure (Exercise)  130/80    Blood Pressure (Exit)  130/82    Heart Rate (Admit)  87 bpm    Heart Rate (Exercise)  100 bpm    Heart Rate (Exit)  75 bpm    Rating of Perceived Exertion (Exercise)  13    Symptoms  none during exercise    Duration  Progress to 45 minutes of aerobic exercise without signs/symptoms of physical distress    Intensity  THRR unchanged      Progression   Progression  Continue to progress workloads to maintain intensity without signs/symptoms of physical distress.    Average METs  2.1      Resistance Training   Training Prescription  Yes    Weight  3 lb    Reps  10-15      NuStep   Level  2    SPM  80    Minutes  15      Recumbant Elliptical   Level  1    RPM  50    Minutes  15    METs  1.4       Nutrition:  Target Goals: Understanding of nutrition guidelines, daily intake of sodium '1500mg'$ , cholesterol '200mg'$ , calories 30% from fat and 7% or less from saturated fats, daily to have 5 or more servings of fruits and vegetables.  Biometrics: Pre Biometrics - 01/24/18 1233      Pre Biometrics   Height  5' 1.8" (1.57 m)    Weight  192 lb 1.6 oz (87.1 kg)    Waist Circumference  40 inches    Hip Circumference  45.5 inches    Waist to Hip Ratio  0.88 %    BMI (Calculated)  35.35    Single Leg Stand  16.86 seconds        Nutrition Therapy Plan and Nutrition Goals: Nutrition Therapy & Goals - 01/24/18 1213      Intervention Plan   Intervention  Prescribe, educate and counsel regarding individualized specific dietary modifications aiming towards  targeted core components such as weight, hypertension, lipid management, diabetes, heart failure and other comorbidities.;Nutrition handout(s) given to patient.    Expected Outcomes  Short Term Goal: Understand basic principles of dietary content, such as calories, fat, sodium, cholesterol and nutrients.;Long Term Goal: Adherence to prescribed nutrition plan.;Short Term Goal: A plan has been developed with personal nutrition goals set during dietitian appointment.       Nutrition Assessments: Nutrition Assessments - 01/24/18 1213      MEDFICTS Scores   Pre Score  111       Nutrition Goals Re-Evaluation:   Nutrition Goals Discharge (Final Nutrition Goals Re-Evaluation):   Psychosocial: Target Goals: Acknowledge presence or absence of significant depression and/or stress, maximize coping skills, provide positive support system. Participant is able to verbalize types and ability to use techniques and skills needed for reducing stress and depression.   Initial Review & Psychosocial Screening: Initial Psych Review &  Screening - 01/24/18 1208      Initial Review   Current issues with  Current Sleep Concerns;Current Anxiety/Panic;Current Stress Concerns    Source of Stress Concerns  Unable to participate in former interests or hobbies;Unable to perform yard/household activities    Comments  Currently waking up with chest discomfort/ questionable acid reflux during the night. It is scary for her. Sometimes she takes Nitro, other times she just waits. She is not back at work yet. She works at a [employer].       Family Dynamics   Good Support System?  Yes   spouse, family, friends     Barriers   Psychosocial barriers to participate in program  There are no identifiable barriers or psychosocial needs.;The patient should benefit from training in stress management and relaxation.      Screening Interventions   Interventions  Encouraged to exercise;Program counselor consult;To provide  support and resources with identified psychosocial needs;Provide feedback about the scores to participant    Expected Outcomes  Short Term goal: Utilizing psychosocial counselor, staff and physician to assist with identification of specific Stressors or current issues interfering with healing process. Setting desired goal for each stressor or current issue identified.;Long Term Goal: Stressors or current issues are controlled or eliminated.;Short Term goal: Identification and review with participant of any Quality of Life or Depression concerns found by scoring the questionnaire.;Long Term goal: The participant improves quality of Life and PHQ9 Scores as seen by post scores and/or verbalization of changes       Quality of Life Scores:  Quality of Life - 01/24/18 1209      Quality of Life   Select  Quality of Life      Quality of Life Scores   Health/Function Pre  20 %    Socioeconomic Pre  24 %    Psych/Spiritual Pre  22.29 %    Family Pre  20.4 %    GLOBAL Pre  21.35 %      Scores of 19 and below usually indicate a poorer quality of life in these areas.  A difference of  2-3 points is a clinically meaningful difference.  A difference of 2-3 points in the total score of the Quality of Life Index has been associated with significant improvement in overall quality of life, self-image, physical symptoms, and general health in studies assessing change in quality of life.  PHQ-9: Recent Review Flowsheet Data    Depression screen [facility] 2/9 01/28/2018 01/24/2018 01/11/2018 01/03/2018 09/17/2017   Decreased Interest 0 2 0 0 0   Down, Depressed, Hopeless 0 1 0 0 0   PHQ - 2 Score 0 3 0 0 0   Altered sleeping 0 3 - - -   Tired, decreased energy 0 1 - - -   Change in appetite 0 1 - - -   Feeling bad or failure about yourself  0 0 - - -   Trouble concentrating 0 0 - - -   Moving slowly or fidgety/restless 0 0 - - -   Suicidal thoughts 0 0 - - -   PHQ-9 Score 0 8 - - -   Difficult doing work/chores -  Somewhat difficult - - -     Interpretation of Total Score  Total Score Depression Severity:  1-4 = Minimal depression, 5-9 = Mild depression, 10-14 = Moderate depression, 15-19 = Moderately severe depression, 20-27 = Severe depression   Psychosocial Evaluation and Intervention: Psychosocial Evaluation - 01/31/18 1712  Psychosocial Evaluation & Interventions   Interventions  Encouraged to exercise with the program and follow exercise prescription    Comments  Counselor met with Sherry Torres) today for initial psychosocial evaluation.  She is a 57 year old who had a heart attack on 8/20 and is being treated with diet, exercise and Rx.  She has a strong support system with a spouse of 20+ years; several daughters and a sister who lives locally.  She is in fairly good health other than her heart and diabetes.  Elora reports not sleeping well due to pain since surgery and told the nurse this today to inform the Dr.  She has a good appetite.  Araceli denies a history of depression or anxiety although she admits to increased anxiety since the surgery and has been on Wellbutrin over 10 years.  Lendora states she is typically in a positive mood most of the time.  She reports other than her health, and not being able to go to work yet, she has minimal stress in her life.  Levonne has goals to eat better; exercise more and get back to her normal life.  Staff will follow with her.    Expected Outcomes  Short:  Lailoni will exercise to improve her health and mental health.  She will hopefully improve her sleep from exercising consistently as well.  Long:  Sagrario will develop a routine of positive self-care strategies to maintain her health and mental health.    Continue Psychosocial Services   Follow up required by staff       Psychosocial Re-Evaluation:   Psychosocial Discharge (Final Psychosocial Re-Evaluation):   Vocational Rehabilitation: Provide vocational rehab assistance to  qualifying candidates.   Vocational Rehab Evaluation & Intervention: Vocational Rehab - 01/24/18 1207      Initial Vocational Rehab Evaluation & Intervention   Assessment shows need for Vocational Rehabilitation  No       Education: Education Goals: Education classes will be provided on a variety of topics geared toward better understanding of heart health and risk factor modification. Participant will state understanding/return demonstration of topics presented as noted by education test scores.  Learning Barriers/Preferences: Learning Barriers/Preferences - 01/24/18 1205      Learning Barriers/Preferences   Learning Barriers  Exercise Concerns    Learning Preferences  None       Education Topics:  AED/CPR: - Group verbal and written instruction with the use of models to demonstrate the basic use of the AED with the basic ABC's of resuscitation.   Cardiac Rehab from 01/31/2018 in [facility] Cardiac and Pulmonary Rehab  Date  01/31/18  Educator  CE  Instruction Review Code  1- Verbalizes Understanding      General Nutrition Guidelines/Fats and Fiber: -Group instruction provided by verbal, written material, models and posters to present the general guidelines for heart healthy nutrition. Gives an explanation and review of dietary fats and fiber.   Controlling Sodium/Reading Food Labels: -Group verbal and written material supporting the discussion of sodium use in heart healthy nutrition. Review and explanation with models, verbal and written materials for utilization of the food label.   Exercise Physiology & General Exercise Guidelines: - Group verbal and written instruction with models to review the exercise physiology of the cardiovascular system and associated critical values. Provides general exercise guidelines with specific guidelines to those with heart or lung disease.    Aerobic Exercise & Resistance Training: - Gives group verbal and written instruction on the various  components of  exercise. Focuses on aerobic and resistive training programs and the benefits of this training and how to safely progress through these programs..   Flexibility, Balance, Mind/Body Relaxation: Provides group verbal/written instruction on the benefits of flexibility and balance training, including mind/body exercise modes such as yoga, pilates and tai chi.  Demonstration and skill practice provided.   Stress and Anxiety: - Provides group verbal and written instruction about the health risks of elevated stress and causes of high stress.  Discuss the correlation between heart/lung disease and anxiety and treatment options. Review healthy ways to manage with stress and anxiety.   Depression: - Provides group verbal and written instruction on the correlation between heart/lung disease and depressed mood, treatment options, and the stigmas associated with seeking treatment.   Anatomy & Physiology of the Heart: - Group verbal and written instruction and models provide basic cardiac anatomy and physiology, with the coronary electrical and arterial systems. Review of Valvular disease and Heart Failure   Cardiac Procedures: - Group verbal and written instruction to review commonly prescribed medications for heart disease. Reviews the medication, class of the drug, and side effects. Includes the steps to properly store meds and maintain the prescription regimen. (beta blockers and nitrates)   Cardiac Rehab from 01/31/2018 in [facility] Cardiac and Pulmonary Rehab  Date  01/26/18  Educator  [facility]  Instruction Review Code  1- Verbalizes Understanding      Cardiac Medications I: - Group verbal and written instruction to review commonly prescribed medications for heart disease. Reviews the medication, class of the drug, and side effects. Includes the steps to properly store meds and maintain the prescription regimen.   Cardiac Medications II: -Group verbal and written instruction to review  commonly prescribed medications for heart disease. Reviews the medication, class of the drug, and side effects. (all other drug classes)    Go Sex-Intimacy & Heart Disease, Get SMART - Goal Setting: - Group verbal and written instruction through game format to discuss heart disease and the return to sexual intimacy. Provides group verbal and written material to discuss and apply goal setting through the application of the S.M.A.R.T. Method.   Cardiac Rehab from 01/31/2018 in [facility] Cardiac and Pulmonary Rehab  Date  01/26/18  Educator  [facility]  Instruction Review Code  1- Verbalizes Understanding      Other Matters of the Heart: - Provides group verbal, written materials and models to describe Stable Angina and Peripheral Artery. Includes description of the disease process and treatment options available to the cardiac patient.   Exercise & Equipment Safety: - Individual verbal instruction and demonstration of equipment use and safety with use of the equipment.   Cardiac Rehab from 01/31/2018 in [facility] Cardiac and Pulmonary Rehab  Date  01/24/18  Educator  [facility]  Instruction Review Code  1- Verbalizes Understanding      Infection Prevention: - Provides verbal and written material to individual with discussion of infection control including proper hand washing and proper equipment cleaning during exercise session.   Cardiac Rehab from 01/31/2018 in [facility] Cardiac and Pulmonary Rehab  Date  01/24/18  Educator  [facility]  Instruction Review Code  1- Verbalizes Understanding      Falls Prevention: - Provides verbal and written material to individual with discussion of falls prevention and safety.   Cardiac Rehab from 01/31/2018 in [facility] Cardiac and Pulmonary Rehab  Date  01/24/18  Educator  [facility]  Instruction Review Code  1- Verbalizes Understanding      Diabetes: - Individual  verbal and written instruction to review signs/symptoms of diabetes, desired ranges of glucose level fasting, after meals and  with exercise. Acknowledge that pre and post exercise glucose checks will be done for 3 sessions at entry of program.   Cardiac Rehab from 01/31/2018 in [facility] Cardiac and Pulmonary Rehab  Date  01/24/18  Educator  [facility]  Instruction Review Code  1- Verbalizes Understanding      Know Your Numbers and Risk Factors: -Group verbal and written instruction about important numbers in your health.  Discussion of what are risk factors and how they play a role in the disease process.  Review of Cholesterol, Blood Pressure, Diabetes, and BMI and the role they play in your overall health.   Sleep Hygiene: -Provides group verbal and written instruction about how sleep can affect your health.  Define sleep hygiene, discuss sleep cycles and impact of sleep habits. Review good sleep hygiene tips.    Other: -Provides group and verbal instruction on various topics (see comments)   Knowledge Questionnaire Score: Knowledge Questionnaire Score - 01/24/18 1205      Knowledge Questionnaire Score   Pre Score  22/26   correct answers reviewed with Sherry Torres, focus on exercise and nutrition      Core Components/Risk Factors/Patient Goals at Admission: Personal Goals and Risk Factors at Admission - 01/24/18 1203      Core Components/Risk Factors/Patient Goals on Admission    Weight Management  Yes;Obesity;Weight Loss    Intervention  Weight Management: Develop a combined nutrition and exercise program designed to reach desired caloric intake, while maintaining appropriate intake of nutrient and fiber, sodium and fats, and appropriate energy expenditure required for the weight goal.;Weight Management: Provide education and appropriate resources to help participant work on and attain dietary goals.;Weight Management/Obesity: Establish reasonable short term and long term weight goals.;Obesity: Provide education and appropriate resources to help participant work on and attain dietary goals.    Admit Weight  191 lb  (86.6 kg)    Goal Weight: Short Term  187 lb (84.8 kg)    Goal Weight: Long Term  140 lb (63.5 kg)    Expected Outcomes  Short Term: Continue to assess and modify interventions until short term weight is achieved;Long Term: Adherence to nutrition and physical activity/exercise program aimed toward attainment of established weight goal;Weight Loss: Understanding of general recommendations for a balanced deficit meal plan, which promotes 1-2 lb weight loss per week and includes a negative energy balance of [phone] kcal/d;Understanding recommendations for meals to include 15-35% energy as protein, 25-35% energy from fat, 35-60% energy from carbohydrates, less than '200mg'$  of dietary cholesterol, 20-35 gm of total fiber daily;Understanding of distribution of calorie intake throughout the day with the consumption of 4-5 meals/snacks    Tobacco Cessation  Yes   quit 01/10/18   Intervention  Assist the participant in steps to quit. Provide individualized education and counseling about committing to Tobacco Cessation, relapse prevention, and pharmacological support that can be provided by physician.;Advice worker, assist with locating and accessing local/national Quit Smoking programs, and support quit date choice.    Expected Outcomes  Short Term: Will quit all tobacco product use, adhering to prevention of relapse plan.;Long Term: Complete abstinence from all tobacco products for at least 12 months from quit date.    Diabetes  Yes    Intervention  Provide education about signs/symptoms and action to take for hypo/hyperglycemia.;Provide education about proper nutrition, including hydration, and aerobic/resistive exercise prescription along with prescribed medications to achieve  blood glucose in normal ranges: Fasting glucose 65-99 mg/dL    Expected Outcomes  Short Term: Participant verbalizes understanding of the signs/symptoms and immediate care of hyper/hypoglycemia, proper foot care and  importance of medication, aerobic/resistive exercise and nutrition plan for blood glucose control.;Long Term: Attainment of HbA1C < 7%.    Hypertension  Yes    Intervention  Provide education on lifestyle modifcations including regular physical activity/exercise, weight management, moderate sodium restriction and increased consumption of fresh fruit, vegetables, and low fat dairy, alcohol moderation, and smoking cessation.;Monitor prescription use compliance.    Expected Outcomes  Short Term: Continued assessment and intervention until BP is < 140/76m HG in hypertensive participants. < 130/861mHG in hypertensive participants with diabetes, heart failure or chronic kidney disease.;Long Term: Maintenance of blood pressure at goal levels.    Lipids  Yes    Intervention  Provide education and support for participant on nutrition & aerobic/resistive exercise along with prescribed medications to achieve LDL '70mg'$ , HDL >'40mg'$ .    Expected Outcomes  Short Term: Participant states understanding of desired cholesterol values and is compliant with medications prescribed. Participant is following exercise prescription and nutrition guidelines.;Long Term: Cholesterol controlled with medications as prescribed, with individualized exercise RX and with personalized nutrition plan. Value goals: LDL < '70mg'$ , HDL > 40 mg.       Core Components/Risk Factors/Patient Goals Review:  Goals and Risk Factor Review    Row Name 01/31/18 1820             Core Components/Risk Factors/Patient Goals Review   Review  ThKalyiah/o at night after dinner she has chest pain. I asked her to contact her Cardiologist. She does not have chest pain or any chest discomfort s/s exercising in Cardiac Rehab. I faxed an FYI to her Cardiologist.        Expected Outcomes  Free of chest pain or s/s.          Core Components/Risk Factors/Patient Goals at Discharge (Final Review):  Goals and Risk Factor Review - 01/31/18 1820      Core  Components/Risk Factors/Patient Goals Review   Review  ThLenea/o at night after dinner she has chest pain. I asked her to contact her Cardiologist. She does not have chest pain or any chest discomfort s/s exercising in Cardiac Rehab. I faxed an FYI to her Cardiologist.     Expected Outcomes  Free of chest pain or s/s.       ITP Comments: ITP Comments    Row Name 01/24/18 1155 01/26/18 0557 02/02/18 1554 02/23/18 0748 03/03/18 1048   ITP Comments  Med Review completed. Initial ITP created. [facility]  30 day review completed. ITP sent to Dr. MaEmily FilbertMedical Director of Cardiac Rehab. Continue with ITP unless changes are made by physician  New to program  ThDoreealled and said she will not exercise today since she is scheduled for an echo due to her c/o chest pressure approx 2 hours after supper.  30 day review completed. ITP sent to Dr. MaEmily FilbertMedical Director of Cardiac Rehab. Continue with ITP unless changes are made by physician  ThMazals back to work 9-5 and will call back next week to see if she can adjust her schedule to attend class.  She has not attended since last review.   Row Name 03/16/18 1416           ITP Comments  Called ThAmariaho ask about her attendance.  She has not been able to make her schedule work to attend class. She did report her insurance will help pay for a nutritionist so she is looking into that as well as joining [employer]. Education material will be mailes to her. She was informed by staff that if she needs anything to please reach out to [place].           Comments: Discharge ITP

## 2018-03-16 NOTE — Progress Notes (Signed)
Discharge Progress Report  Patient Details  Name: Sherry Torres MRN: [id number] Date of Birth: [birth year] Referring Provider:     Cardiac Rehab from 01/24/2018 in [facility] Cardiac and Pulmonary Rehab  Referring Provider  Lorine Bears MD [Attending: Dr. Cristal Deer End]       Number of Visits: 6  Reason for Discharge:  Early Exit:  Back to work and Lack of attendance  Smoking History:  Social History   Tobacco Use  Smoking Status Former Smoker  . Types: Cigarettes  Smokeless Tobacco Never Used  Tobacco Comment   hasnt smoked in 1 wk    Diagnosis:  No diagnosis found.  ADL UCSD:   Initial Exercise Prescription: Initial Exercise Prescription - 01/24/18 1200      Date of Initial Exercise RX and Referring Provider   Date  01/24/18    Referring Provider  Lorine Bears MD   Attending: Dr. Cristal Deer End     Treadmill   MPH  1.8    Grade  1    Minutes  15    METs  2.63      NuStep   Level  2    SPM  80    Minutes  15    METs  2.5      Recumbant Elliptical   Level  1    RPM  50    Minutes  15    METs  2.5      Prescription Details   Frequency (times per week)  3    Duration  Progress to 45 minutes of aerobic exercise without signs/symptoms of physical distress      Intensity   THRR 40-80% of Max Heartrate  99-142    Ratings of Perceived Exertion  11-13    Perceived Dyspnea  0-4      Progression   Progression  Continue to progress workloads to maintain intensity without signs/symptoms of physical distress.      Resistance Training   Training Prescription  Yes    Weight  3 lbs    Reps  10-15       Discharge Exercise Prescription (Final Exercise Prescription Changes): Exercise Prescription Changes - 02/02/18 1400      Response to Exercise   Blood Pressure (Admit)  110/60    Blood Pressure (Exercise)  130/80    Blood Pressure (Exit)  130/82    Heart Rate (Admit)  87 bpm    Heart Rate (Exercise)  100 bpm    Heart Rate (Exit)  75 bpm    Rating of Perceived Exertion (Exercise)  13    Symptoms  none during exercise    Duration  Progress to 45 minutes of aerobic exercise without signs/symptoms of physical distress    Intensity  THRR unchanged      Progression   Progression  Continue to progress workloads to maintain intensity without signs/symptoms of physical distress.    Average METs  2.1      Resistance Training   Training Prescription  Yes    Weight  3 lb    Reps  10-15      NuStep   Level  2    SPM  80    Minutes  15      Recumbant Elliptical   Level  1    RPM  50    Minutes  15    METs  1.4       Functional Capacity: 6 Minute Walk    Row Name 01/24/18  1222         6 Minute Walk   Phase  Initial     Distance  1045 feet     Walk Time  6 minutes     # of Rest Breaks  0     MPH  1.98     METS  2.68     RPE  12     VO2 Peak  9.37     Symptoms  Yes (comment)     Comments  chest tightness at end 1/10     Resting HR  56 bpm     Resting BP  124/66     Resting Oxygen Saturation   97 %     Exercise Oxygen Saturation  during 6 min walk  99 %     Max Ex. HR  90 bpm     Max Ex. BP  146/74     2 Minute Post BP  124/70        Psychological, QOL, Others - Outcomes: PHQ 2/9: Depression screen [facility] 2/9 01/28/2018 01/24/2018 01/11/2018 01/03/2018 09/17/2017  Decreased Interest 0 2 0 0 0  Down, Depressed, Hopeless 0 1 0 0 0  PHQ - 2 Score 0 3 0 0 0  Altered sleeping 0 3 - - -  Tired, decreased energy 0 1 - - -  Change in appetite 0 1 - - -  Feeling bad or failure about yourself  0 0 - - -  Trouble concentrating 0 0 - - -  Moving slowly or fidgety/restless 0 0 - - -  Suicidal thoughts 0 0 - - -  PHQ-9 Score 0 8 - - -  Difficult doing work/chores - Somewhat difficult - - -    Quality of Life: Quality of Life - 01/24/18 1209      Quality of Life   Select  Quality of Life      Quality of Life Scores   Health/Function Pre  20 %    Socioeconomic Pre  24 %    Psych/Spiritual Pre  22.29 %    Family Pre   20.4 %    GLOBAL Pre  21.35 %       Personal Goals: Goals established at orientation with interventions provided to work toward goal. Personal Goals and Risk Factors at Admission - 01/24/18 1203      Core Components/Risk Factors/Patient Goals on Admission    Weight Management  Yes;Obesity;Weight Loss    Intervention  Weight Management: Develop a combined nutrition and exercise program designed to reach desired caloric intake, while maintaining appropriate intake of nutrient and fiber, sodium and fats, and appropriate energy expenditure required for the weight goal.;Weight Management: Provide education and appropriate resources to help participant work on and attain dietary goals.;Weight Management/Obesity: Establish reasonable short term and long term weight goals.;Obesity: Provide education and appropriate resources to help participant work on and attain dietary goals.    Admit Weight  191 lb (86.6 kg)    Goal Weight: Short Term  187 lb (84.8 kg)    Goal Weight: Long Term  140 lb (63.5 kg)    Expected Outcomes  Short Term: Continue to assess and modify interventions until short term weight is achieved;Long Term: Adherence to nutrition and physical activity/exercise program aimed toward attainment of established weight goal;Weight Loss: Understanding of general recommendations for a balanced deficit meal plan, which promotes 1-2 lb weight loss per week and includes a negative energy balance of [id number] kcal/d;Understanding recommendations for meals  to include 15-35% energy as protein, 25-35% energy from fat, 35-60% energy from carbohydrates, less than 200mg  of dietary cholesterol, 20-35 gm of total fiber daily;Understanding of distribution of calorie intake throughout the day with the consumption of 4-5 meals/snacks    Tobacco Cessation  Yes   quit 01/10/18   Intervention  Assist the participant in steps to quit. Provide individualized education and counseling about committing to Tobacco Cessation,  relapse prevention, and pharmacological support that can be provided by physician.;Education officer, environmental, assist with locating and accessing local/national Quit Smoking programs, and support quit date choice.    Expected Outcomes  Short Term: Will quit all tobacco product use, adhering to prevention of relapse plan.;Long Term: Complete abstinence from all tobacco products for at least 12 months from quit date.    Diabetes  Yes    Intervention  Provide education about signs/symptoms and action to take for hypo/hyperglycemia.;Provide education about proper nutrition, including hydration, and aerobic/resistive exercise prescription along with prescribed medications to achieve blood glucose in normal ranges: Fasting glucose 65-99 mg/dL    Expected Outcomes  Short Term: Participant verbalizes understanding of the signs/symptoms and immediate care of hyper/hypoglycemia, proper foot care and importance of medication, aerobic/resistive exercise and nutrition plan for blood glucose control.;Long Term: Attainment of HbA1C < 7%.    Hypertension  Yes    Intervention  Provide education on lifestyle modifcations including regular physical activity/exercise, weight management, moderate sodium restriction and increased consumption of fresh fruit, vegetables, and low fat dairy, alcohol moderation, and smoking cessation.;Monitor prescription use compliance.    Expected Outcomes  Short Term: Continued assessment and intervention until BP is < 140/76mm HG in hypertensive participants. < 130/66mm HG in hypertensive participants with diabetes, heart failure or chronic kidney disease.;Long Term: Maintenance of blood pressure at goal levels.    Lipids  Yes    Intervention  Provide education and support for participant on nutrition & aerobic/resistive exercise along with prescribed medications to achieve LDL 70mg , HDL >40mg .    Expected Outcomes  Short Term: Participant states understanding of desired cholesterol values  and is compliant with medications prescribed. Participant is following exercise prescription and nutrition guidelines.;Long Term: Cholesterol controlled with medications as prescribed, with individualized exercise RX and with personalized nutrition plan. Value goals: LDL < 70mg , HDL > 40 mg.        Personal Goals Discharge: Goals and Risk Factor Review    Row Name 01/31/18 1820             Core Components/Risk Factors/Patient Goals Review   Review  Michelle c/o at night after dinner she has chest pain. I asked her to contact her Cardiologist. She does not have chest pain or any chest discomfort s/s exercising in Cardiac Rehab. I faxed an FYI to her Cardiologist.        Expected Outcomes  Free of chest pain or s/s.          Exercise Goals and Review: Exercise Goals    Row Name 01/24/18 1233             Exercise Goals   Increase Physical Activity  Yes       Intervention  Provide advice, education, support and counseling about physical activity/exercise needs.;Develop an individualized exercise prescription for aerobic and resistive training based on initial evaluation findings, risk stratification, comorbidities and participant's personal goals.       Expected Outcomes  Short Term: Attend rehab on a regular basis to increase amount of physical activity.;Long Term:  Add in home exercise to make exercise part of routine and to increase amount of physical activity.;Long Term: Exercising regularly at least 3-5 days a week.       Increase Strength and Stamina  Yes       Intervention  Provide advice, education, support and counseling about physical activity/exercise needs.;Develop an individualized exercise prescription for aerobic and resistive training based on initial evaluation findings, risk stratification, comorbidities and participant's personal goals.       Expected Outcomes  Short Term: Increase workloads from initial exercise prescription for resistance, speed, and METs.;Short Term:  Perform resistance training exercises routinely during rehab and add in resistance training at home;Long Term: Improve cardiorespiratory fitness, muscular endurance and strength as measured by increased METs and functional capacity ( )       Able to understand and use rate of perceived exertion (RPE) scale  Yes       Intervention  Provide education and explanation on how to use RPE scale       Expected Outcomes  Short Term: Able to use RPE daily in rehab to express subjective intensity level;Long Term:  Able to use RPE to guide intensity level when exercising independently       Knowledge and understanding of Target Heart Rate Range (THRR)  Yes       Intervention  Provide education and explanation of THRR including how the numbers were predicted and where they are located for reference       Expected Outcomes  Short Term: Able to state/look up THRR;Short Term: Able to use daily as guideline for intensity in rehab;Long Term: Able to use THRR to govern intensity when exercising independently       Able to check pulse independently  Yes       Intervention  Provide education and demonstration on how to check pulse in carotid and radial arteries.;Review the importance of being able to check your own pulse for safety during independent exercise       Expected Outcomes  Short Term: Able to explain why pulse checking is important during independent exercise;Long Term: Able to check pulse independently and accurately       Understanding of Exercise Prescription  Yes       Intervention  Provide education, explanation, and written materials on patient's individual exercise prescription       Expected Outcomes  Short Term: Able to explain program exercise prescription;Long Term: Able to explain home exercise prescription to exercise independently          Nutrition & Weight - Outcomes: Pre Biometrics - 01/24/18 1233      Pre Biometrics   Height  5' 1.8" (1.57 m)    Weight  192 lb 1.6 oz (87.1 kg)     Waist Circumference  40 inches    Hip Circumference  45.5 inches    Waist to Hip Ratio  0.88 %    BMI (Calculated)  35.35    Single Leg Stand  16.86 seconds        Nutrition: Nutrition Therapy & Goals - 01/24/18 1213      Intervention Plan   Intervention  Prescribe, educate and counsel regarding individualized specific dietary modifications aiming towards targeted core components such as weight, hypertension, lipid management, diabetes, heart failure and other comorbidities.;Nutrition handout(s) given to patient.    Expected Outcomes  Short Term Goal: Understand basic principles of dietary content, such as calories, fat, sodium, cholesterol and nutrients.;Long Term Goal: Adherence to prescribed nutrition plan.;Short Term  Goal: A plan has been developed with personal nutrition goals set during dietitian appointment.       Nutrition Discharge: Nutrition Assessments - 01/24/18 1213      MEDFICTS Scores   Pre Score  111       Education Questionnaire Score: Knowledge Questionnaire Score - 01/24/18 1205      Knowledge Questionnaire Score   Pre Score  22/26   correct answers reviewed with Breyona, focus on exercise and nutrition      Goals reviewed with patient; copy given to patient.

## 2018-03-21 ENCOUNTER — Encounter: Payer: BLUE CROSS/BLUE SHIELD | Attending: Cardiovascular Disease

## 2018-03-21 DIAGNOSIS — Z79899 Other long term (current) drug therapy: Secondary | ICD-10-CM | POA: Insufficient documentation

## 2018-03-21 DIAGNOSIS — I214 Non-ST elevation (NSTEMI) myocardial infarction: Secondary | ICD-10-CM | POA: Insufficient documentation

## 2018-03-21 DIAGNOSIS — Z7984 Long term (current) use of oral hypoglycemic drugs: Secondary | ICD-10-CM | POA: Insufficient documentation

## 2018-03-21 DIAGNOSIS — Z7982 Long term (current) use of aspirin: Secondary | ICD-10-CM | POA: Insufficient documentation

## 2018-03-21 DIAGNOSIS — Z7902 Long term (current) use of antithrombotics/antiplatelets: Secondary | ICD-10-CM | POA: Insufficient documentation

## 2018-03-24 ENCOUNTER — Ambulatory Visit: Payer: BLUE CROSS/BLUE SHIELD | Admitting: Physician Assistant

## 2018-03-31 ENCOUNTER — Encounter: Payer: Self-pay | Admitting: Nurse Practitioner

## 2018-03-31 ENCOUNTER — Ambulatory Visit (INDEPENDENT_AMBULATORY_CARE_PROVIDER_SITE_OTHER): Payer: BLUE CROSS/BLUE SHIELD | Admitting: Nurse Practitioner

## 2018-03-31 VITALS — BP 126/60 | HR 56 | Ht 61.5 in | Wt 193.0 lb

## 2018-03-31 DIAGNOSIS — I2510 Atherosclerotic heart disease of native coronary artery without angina pectoris: Secondary | ICD-10-CM

## 2018-03-31 DIAGNOSIS — E785 Hyperlipidemia, unspecified: Secondary | ICD-10-CM

## 2018-03-31 DIAGNOSIS — I10 Essential (primary) hypertension: Secondary | ICD-10-CM | POA: Diagnosis not present

## 2018-03-31 MED ORDER — ATORVASTATIN CALCIUM 80 MG PO TABS: 80.0000 mg | ORAL_TABLET | Freq: Every day | ORAL | 3 refills | Status: DC

## 2018-03-31 MED ORDER — ISOSORBIDE MONONITRATE ER 60 MG PO TB24: 60.0000 mg | ORAL_TABLET | Freq: Two times a day (BID) | ORAL | 3 refills | Status: DC

## 2018-03-31 MED ORDER — CLOPIDOGREL BISULFATE 75 MG PO TABS: 75.0000 mg | ORAL_TABLET | Freq: Every day | ORAL | 3 refills | Status: DC

## 2018-03-31 MED ORDER — HYDROCHLOROTHIAZIDE 25 MG PO TABS: 25.0000 mg | ORAL_TABLET | Freq: Every day | ORAL | 3 refills | Status: DC

## 2018-03-31 NOTE — Progress Notes (Signed)
Office Visit    Patient Name: Sherry Torres Date of Encounter: 03/31/2018  Primary Care Provider:  Carlean Jews, NP Primary Cardiologist:  Yvonne Kendall, MD  Chief Complaint    Sherry Torres is a 57 year old female with a past history of Diabetes, Hypertension, Hyperlipidemia, Anxiety, and CAD. Here today for follow up of CAD s/p NSTEMI in 12/2017.   Past Medical History    Past Medical History:  Diagnosis Date  . Anxiety   . Asthma   . CAD (coronary artery disease)    a. 12/2017 NSTEMI;  b. 12/2017 MV: basal and mid inflat ischemia; c. 12/2017 Cath: LM nl, LAD 30ost, D1 mild dzs, LCX 40ost, 95d, RCA 30m (nondominant). LCX felt to be culprit but only 1.43mm vessel-->Med rx.  . Diastolic dysfunction    a. 12/2017 Echo: EF 55-60%, no rwma, Gr1 DD; b. 01/2018 Echo: EF 60-65%, gr2 DD, no rwma, mild MR, nl RV fxn.  . Hyperlipidemia LDL goal <70   . Hypertension   . Recurrent major depressive disorder, in partial remission (HCC) 09/17/2017  . Tobacco abuse   . Type 2 diabetes mellitus (HCC)    Past Surgical History:  Procedure Laterality Date  . LEFT HEART CATH AND CORONARY ANGIOGRAPHY N/A 01/06/2018   Procedure: LEFT HEART CATH AND CORONARY ANGIOGRAPHY;  Surgeon: Iran Ouch, MD;  Location: [facility] INVASIVE CV LAB;  Service: Cardiovascular;  Laterality: N/A;    Allergies  Allergies  Allergen Reactions  . Codeine Itching    History of Present Illness    Sherry Torres is a 57 year old female with a past history of Diabetes, Hypertension, Hyperlipidemia, Anxiety and CAD. She was recently admitted in August for chest pain, work up revealed positive Troponins and nuclear stress test was performed that showed abnormalities in the basal and inferolateral segments. EKG showed non-specific ST changes. Echo showed normal EF, nwma, and Grade 1 DD. CTA of chest was negative for PE but did show calcifications of coronary arteries. Decision was made to perform LHC which showed  LM nl, LAD 30ost, D1 mild dzs, LCX 40ost, 95d, RCA 26m (nondominant). LCX felt to be culprit but only 1.1mm vessel. Based on cath results medical management was chosen. She was started on ASA, Plavis, beta blocker, statin, ARB, and long acting nitrate. Over the course of her follow up her HCTZ was resumed by her PCP 2/2 elevated BP and LEE. She had also continued to have chest pain, primarily in the evenings while at rest, but was able to perform her normal daily activities without complication. Follow up echo performed to r/o pericardial effusion, continued to show normal EF and nwma, DD now at grade 2 and no effusion. At her last follow up her long acting nitrate was increased and dosage timing was changed to BID with evening dose taken around 5pm.   Since her last visit she reports doing very well. She denies chest pain, palpitations, dyspnea, pnd, orthopnea, n, v, dizziness, syncope, edema, weight gain, or early satiety. She had been participating in cardiac rehab but had to stop once returning to work due to conflicts with scheduling. She plans to join a gym and take her discharge recommendations from cardiac rehab with her to continue their recommended workout routine. She has been able to tolerate daily activity without complication and has returned to work full time. She has been trying to reduce her salt intake and she recently found out the her insurance will cover nutritional counseling  for diabetes. She has remained off of cigarettes since her NSTEMI in August.   Home Medications    Prior to Admission medications   Medication Sig Start Date End Date Taking? Authorizing Provider  aspirin 81 MG chewable tablet Chew 1 tablet (81 mg total) by mouth daily. 01/07/18   Shaune Pollackhen, Qing, MD  atorvastatin (LIPITOR) 80 MG tablet Take 1 tablet (80 mg total) by mouth daily at 6 PM. 01/13/18   Creig HinesBerge, Maddoxx Burkitt Ronald, NP  bisoprolol (ZEBETA) 5 MG tablet Take 1 tablet (5 mg total) by mouth daily. 03/07/18   Creig HinesBerge,  Jozie Wulf Ronald, NP  buPROPion (ZYBAN) 150 MG 12 hr tablet Take 1 tablet (150 mg total) by mouth 2 (two) times daily. 03/15/18   Carlean JewsBoscia, Heather E, NP  clopidogrel (PLAVIX) 75 MG tablet Take 1 tablet (75 mg total) by mouth daily with breakfast. 01/13/18   Creig HinesBerge, Leevi Cullars Ronald, NP  cyclobenzaprine (FLEXERIL) 5 MG tablet Take 1 tablet (5 mg total) by mouth 3 (three) times daily as needed for muscle spasms. 09/17/17   Carlean JewsBoscia, Heather E, NP  diclofenac sodium (VOLTAREN) 1 % GEL Apply 4 g topically 4 (four) times daily. 01/03/18   Carlean JewsBoscia, Heather E, NP  hydrochlorothiazide (HYDRODIURIL) 25 MG tablet Take 1 tablet (25 mg total) by mouth daily. 02/03/18 05/04/18  Sondra Bargesunn, Ryan M, PA-C  isosorbide mononitrate (IMDUR) 60 MG 24 hr tablet Take 1 tablet (60 mg total) by mouth 2 (two) times daily. 02/18/18 05/19/18  Creig HinesBerge, Brendt Dible Ronald, NP  metFORMIN (GLUCOPHAGE) 500 MG tablet Take 0.5 tablets (250 mg total) by mouth 2 (two) times daily with a meal. 12/08/17   Boscia, Kathlynn GrateHeather E, NP  nitroGLYCERIN (NITROSTAT) 0.4 MG SL tablet Place 1 tablet (0.4 mg total) under the tongue every 5 (five) minutes as needed for chest pain. 01/07/18   Shaune Pollackhen, Qing, MD  olmesartan (BENICAR) 5 MG tablet Take 1 tablet (5 mg total) by mouth daily. 12/07/17   Carlean JewsBoscia, Heather E, NP  traZODone (DESYREL) 50 MG tablet Take 0.5-1 tablets (25-50 mg total) by mouth at bedtime as needed for sleep. Patient not taking: Reported on 02/18/2018 01/11/18   Carlean JewsBoscia, Heather E, NP  VENTOLIN HFA 108 (90 Base) MCG/ACT inhaler Inhale 1-2 puffs into the lungs daily. Patient not taking: Reported on 02/18/2018 11/09/17   Carlean JewsBoscia, Heather E, NP    Review of Systems    She denies chest pain, palpitations, dyspnea, pnd, orthopnea, n, v, dizziness, syncope, edema, weight gain, or early satiety.  All other systems reviewed and are otherwise negative except as noted above.  Physical Exam    VS:  BP 126/60 (BP Location: Left Arm, Patient Position: Sitting, Cuff Size:  Normal)   Pulse (!) 56   Ht 5' 1.5" (1.562 m)   Wt 193 lb (87.5 kg)   BMI 35.88 kg/m  , BMI Body mass index is 35.88 kg/m. GEN: Well nourished, well developed, in no acute distress. HEENT: normal. Neck: Supple, no JVD, carotid bruits, or masses. Cardiac: RRR, no murmurs, rubs, or gallops. No clubbing, cyanosis, edema.  Radials/DP/PT 2+ and equal bilaterally.  Respiratory:  Respirations regular and unlabored, clear to auscultation bilaterally. GI: Soft, nontender, nondistended, BS + x 4. MS: no deformity or atrophy. Skin: warm and dry, no rash. Neuro:  Strength and sensation are intact. Psych: Normal affect.  Accessory Clinical Findings    ECG personally reviewed by me today - Sinus Bradycardia, 56 - no acute changes.  Assessment & Plan    1.  CAD  s/p NSTEMI: Recent NSTEMI in August. Underwent cardiac workup and eventual LHC that showed small vessel disease of RCA (nondominant) and LCx (likely culprit  1.60mm vessel). Decision made to medically manage with ASA, Plavix, statin, beta blocker, ARB and nitrates. Her recovery was complicated by recurrent chest pain in the evening when at rest. Echo showed normal EF, no wma, or effusions. Most recent echo did show grade 2 DD. Long acting nitrates were up titrated at last visit and she has since done much better. Currently denies cp, SOB, DOE or edema since last visit. Will continue medical management. No medication changes made today.   2. Hypertension: BP stable, will continue BB, ARB, and nitrates.  3. Hyperlipidemia: Currently on statin. LDL in August was 95. Goal <70. Orders placed for follow up LFT's and Lipids in October but were not drawn. Discussed option to draw labs today but patient chose to defer until visit with PCP in December for 3 month follow up. Would recommend LFT's and Lipid panel be drawn in addition to routine labs at that time.   4. Tobacco use: Remains off of cigarettes, congratulated on this and encouraged to continue  cessation.   5. Dispo:  F/u in 3 mos or sooner if necessary.  Nicolasa Ducking, NP 03/31/2018, 11:01 AM

## 2018-03-31 NOTE — Patient Instructions (Addendum)
Medication Instructions:  - Your physician recommends that you continue on your current medications as directed. Please refer to the Current Medication list given to you today.  If you need a refill on your cardiac medications before your next appointment, please call your pharmacy.   Lab work: - none ordered  If you have labs (blood work) drawn today and your tests are completely normal, you will receive your results only by: Marland Kitchen. MyChart Message (if you have MyChart) OR . A paper copy in the mail If you have any lab test that is abnormal or we need to change your treatment, we will call you to review the results.  Testing/Procedures: - none ordered  Follow-Up: At [facility], you and your health needs are our priority.  As part of our continuing mission to provide you with exceptional heart care, we have created designated Provider Care Teams.  These Care Teams include your primary Cardiologist (physician) and Advanced Practice Providers (APPs -  Physician Assistants and Nurse Practitioners) who all work together to provide you with the care you need, when you need it. . 3 months with Dr. Okey DupreEnd  Any Other Special Instructions Will Be Listed Below (If Applicable). - N/A

## 2018-04-06 ENCOUNTER — Other Ambulatory Visit: Payer: Self-pay | Admitting: Nurse Practitioner

## 2018-04-06 DIAGNOSIS — F5102 Adjustment insomnia: Secondary | ICD-10-CM

## 2018-04-06 MED ORDER — TRAZODONE HCL 50 MG PO TABS: 25.0000 mg | ORAL_TABLET | Freq: Every evening | ORAL | 0 refills | Status: DC | PRN

## 2018-04-08 ENCOUNTER — Other Ambulatory Visit: Payer: Self-pay

## 2018-04-08 DIAGNOSIS — I10 Essential (primary) hypertension: Secondary | ICD-10-CM

## 2018-04-08 MED ORDER — OLMESARTAN MEDOXOMIL 5 MG PO TABS: 5.0000 mg | ORAL_TABLET | Freq: Every day | ORAL | 3 refills | Status: DC

## 2018-04-13 ENCOUNTER — Other Ambulatory Visit: Payer: Self-pay

## 2018-04-13 MED ORDER — VENTOLIN HFA 108 (90 BASE) MCG/ACT IN AERS: 1.0000 | INHALATION_SPRAY | Freq: Every day | RESPIRATORY_TRACT | 2 refills | Status: DC

## 2018-04-18 ENCOUNTER — Telehealth: Payer: Self-pay | Admitting: Internal Medicine

## 2018-04-18 NOTE — Telephone Encounter (Signed)
Patient calling stating she is starting to have more discomfort going on in the days States its been going on for a couple weeks It would start across her back to the top of her shoulder.  She's had to take some Nitro to help relieve the pains Not sure if we need to adjust her medications she states again   Would like advise on what can we do to help her

## 2018-04-19 NOTE — Telephone Encounter (Signed)
We discussed potentially titrating imdur to 90mg .  If she feels that Ss are similar to angina, we can certainly do that now.

## 2018-04-19 NOTE — Telephone Encounter (Signed)
I spoke with the patient. She states that for the last ~ 2 weeks, she has had more discomfort in her back/ shoulder area. She was seen in clinic on 03/31/18 by Ward Givenshris Berge, NP and no medication changes were made at that time.  On 02/18/18 Thayer OhmChris had increased her imdur to 60 mg BID. She is intermittently taking SL NTG with improvement in her symptoms.  The last time she took SL NTG was Saturday/ Sunday.  The patient called today wanting to know if her medications could be adjusted further as she thought Thayer Ohmhris, NP had mentioned this to her at her last visit.  The patient has not been checking her BP at home. She denies any increased dizziness/ SOB/ N/ V/ diaphoresis.  I have advised the patient I will forward to Ward Givenshris Berge, NP & Dr. Okey DupreEnd for further review and recommendations.  She is aware we will call her back once recommendations are received.

## 2018-04-20 MED ORDER — ISOSORBIDE MONONITRATE ER 60 MG PO TB24: 90.0000 mg | ORAL_TABLET | Freq: Two times a day (BID) | ORAL | 2 refills | Status: DC

## 2018-04-20 NOTE — Telephone Encounter (Signed)
I agree with Thayer Ohmhris.  Next step would be increase Imdur.  Yvonne Kendallhristopher Keeley Sussman, MD [facility] Pager: [phone]) [phone]

## 2018-04-20 NOTE — Telephone Encounter (Signed)
Just to clarify- increase imdur to 90 mg BID??

## 2018-04-20 NOTE — Telephone Encounter (Signed)
Called patient and she verbalized understanding to take isosorbide 90 mg (1.5 tablets) two times a day. She was very appreciative and will let us know if any new questions or concerns arise.

## 2018-04-20 NOTE — Telephone Encounter (Signed)
Yes. 90mg  BID.  Sorry for not being clearer.

## 2018-04-26 ENCOUNTER — Telehealth: Payer: Self-pay

## 2018-04-26 NOTE — Telephone Encounter (Signed)
No answer. Left message to call back.   

## 2018-04-26 NOTE — Telephone Encounter (Signed)
Patient states that the change in medication has not helped Patient is still experiencing discomfort Please call to discuss, patient will be at work so best number is [phone]

## 2018-04-26 NOTE — Telephone Encounter (Signed)
Schedule patient to see APP ASAP.  If sx worsen, she should go to ED.  Yvonne Kendallhristopher Cassidey Barrales, MD [facility] Pager: [phone]

## 2018-04-26 NOTE — Telephone Encounter (Signed)
Pt is returning the call.  

## 2018-04-26 NOTE — Telephone Encounter (Signed)
Pt called, having ongoing back and shoulder discomfort despite increase in imdur last week. Pain is worse w movement and after eating. She reports taking 1 dose of NTG on Sunday.   Pt does not have BP cuff at home so she is unaware of VS at this time.   Message routed to provider that made medication change last week.

## 2018-04-27 ENCOUNTER — Telehealth: Payer: Self-pay

## 2018-04-27 NOTE — Telephone Encounter (Signed)
Advice from MD, please have pt come in for appt for unresolved back and arm pain despite increase in Imdur.  Pt reports pain is intermittent.  Appt made with APP on Friday per MD request.   Pt agreeable. I asked her to seek medical attention if symptoms worsen. Advised pt to call for any further questions or concerns

## 2018-04-29 ENCOUNTER — Ambulatory Visit (INDEPENDENT_AMBULATORY_CARE_PROVIDER_SITE_OTHER): Payer: BLUE CROSS/BLUE SHIELD | Admitting: Nurse Practitioner

## 2018-04-29 ENCOUNTER — Encounter

## 2018-04-29 ENCOUNTER — Encounter: Payer: Self-pay | Admitting: Nurse Practitioner

## 2018-04-29 VITALS — BP 120/60 | HR 58 | Ht 62.0 in | Wt 190.5 lb

## 2018-04-29 DIAGNOSIS — I25118 Atherosclerotic heart disease of native coronary artery with other forms of angina pectoris: Secondary | ICD-10-CM | POA: Diagnosis not present

## 2018-04-29 DIAGNOSIS — E119 Type 2 diabetes mellitus without complications: Secondary | ICD-10-CM

## 2018-04-29 DIAGNOSIS — E785 Hyperlipidemia, unspecified: Secondary | ICD-10-CM

## 2018-04-29 DIAGNOSIS — I10 Essential (primary) hypertension: Secondary | ICD-10-CM | POA: Diagnosis not present

## 2018-04-29 MED ORDER — RANOLAZINE ER 500 MG PO TB12: 500.0000 mg | ORAL_TABLET | Freq: Two times a day (BID) | ORAL | 3 refills | Status: DC

## 2018-04-29 NOTE — Patient Instructions (Addendum)
Medication Instructions:  Your physician has recommended you make the following change in your medication:  1- Ranexa 1 tablet (500 mg) by mouth twice daily  If you need a refill on your cardiac medications before your next appointment, please call your pharmacy.   Lab work: None ordered   If you have labs (blood work) drawn today and your tests are completely normal, you will receive your results only by: Marland Kitchen. MyChart Message (if you have MyChart) OR . A paper copy in the mail If you have any lab test that is abnormal or we need to change your treatment, we will call you to review the results.  Testing/Procedures: None ordered   Follow-Up: Feb 14th, 2020 @ 3 PM with Dr. Okey DupreEnd

## 2018-04-29 NOTE — Progress Notes (Signed)
Office Visit    Patient Name: Sherry Torres Date of Encounter: 04/29/2018  Primary Care Provider:  Carlean Jews, NP Primary Cardiologist:  Yvonne Kendall, MD  Chief Complaint    57 year old female with a history of diabetes, hypertension, hyperlipidemia, anxiety, and coronary artery disease, who presents for follow-up related to ongoing stable angina.  Past Medical History    Past Medical History:  Diagnosis Date  . Anxiety   . Asthma   . CAD (coronary artery disease)    a. 12/2017 NSTEMI;  b. 12/2017 MV: basal and mid inflat ischemia; c. 12/2017 Cath: LM nl, LAD 30ost, D1 mild dzs, LCX 40ost, 95d, RCA 20m (nondominant). LCX felt to be culprit but only 1.32mm vessel-->Med rx.  . Diastolic dysfunction    a. 12/2017 Echo: EF 55-60%, no rwma, Gr1 DD; b. 01/2018 Echo: EF 60-65%, gr2 DD, no rwma, mild MR, nl RV fxn.  . Hyperlipidemia LDL goal <70   . Hypertension   . Recurrent major depressive disorder, in partial remission (HCC) 09/17/2017  . Tobacco abuse   . Type 2 diabetes mellitus (HCC)    Past Surgical History:  Procedure Laterality Date  . LEFT HEART CATH AND CORONARY ANGIOGRAPHY N/A 01/06/2018   Procedure: LEFT HEART CATH AND CORONARY ANGIOGRAPHY;  Surgeon: Iran Ouch, MD;  Location: [facility] INVASIVE CV LAB;  Service: Cardiovascular;  Laterality: N/A;    Allergies  Allergies  Allergen Reactions  . Codeine Itching    History of Present Illness    57 year old female with the above past medical history including diabetes, hypertension, hyperlipidemia, anxiety, and coronary artery disease.  In August 2019, she was admitted with shoulder and chest pain and found to have mild troponin elevation.  Stress testing showed inferolateral and basal ischemia.  Echocardiogram showed normal LV function with grade 1 diastolic dysfunction.  CT of the chest was negative for PE but did show coronary calcifications.  Diagnostic catheterization was performed and showed 95% distal  circumflex stenosis as well as a 95% mid RCA stenosis (nondominant).  The circumflex was felt to be the culprit but only 1.5 mm vessel at that point, and decision made to treat medically.  Since August, she has had intermittent exertional upper back and shoulder discomfort, which she identifies is similar to her angina.  This is resulted in titration of her isosorbide mononitrate.  When she was seen in November, she was doing well on 60 mg of isosorbide mononitrate twice daily but following that visit, she noted recurrence of symptoms with activity.  As result, we increased her isosorbide mononitrate to 90 mg twice daily over the phone.  On that dose, she has noticed a slight improvement in exertional angina but says that several days a week, usually around 3 PM, she will experience upper back and shoulder discomfort similar to her presenting symptoms in August though not as severe, that lasted few minutes and resolve with rest.  She has not any prolonged symptoms and denies dyspnea, PND, orthopnea, dizziness, syncope, edema, or early satiety.  Home Medications    Prior to Admission medications   Medication Sig Start Date End Date Taking? Authorizing Provider  aspirin 81 MG chewable tablet Chew 1 tablet (81 mg total) by mouth daily. 01/07/18   Shaune Pollack, MD  atorvastatin (LIPITOR) 80 MG tablet Take 1 tablet (80 mg total) by mouth daily at 6 PM. 03/31/18   Creig Hines, NP  bisoprolol (ZEBETA) 5 MG tablet Take 1 tablet (5 mg total)  by mouth daily. 03/07/18   Creig Hines, NP  buPROPion (ZYBAN) 150 MG 12 hr tablet Take 1 tablet (150 mg total) by mouth 2 (two) times daily. 03/15/18   Carlean Jews, NP  clopidogrel (PLAVIX) 75 MG tablet Take 1 tablet (75 mg total) by mouth daily with breakfast. 03/31/18   Creig Hines, NP  cyclobenzaprine (FLEXERIL) 5 MG tablet Take 1 tablet (5 mg total) by mouth 3 (three) times daily as needed for muscle spasms. 09/17/17   Carlean Jews, NP  diclofenac sodium (VOLTAREN) 1 % GEL Apply 4 g topically 4 (four) times daily. 01/03/18   Carlean Jews, NP  hydrochlorothiazide (HYDRODIURIL) 25 MG tablet Take 1 tablet (25 mg total) by mouth daily. 03/31/18 06/29/18  Creig Hines, NP  isosorbide mononitrate (IMDUR) 60 MG 24 hr tablet Take 1.5 tablets (90 mg total) by mouth 2 (two) times daily. 04/20/18   Creig Hines, NP  metFORMIN (GLUCOPHAGE) 500 MG tablet Take 0.5 tablets (250 mg total) by mouth 2 (two) times daily with a meal. 12/08/17   Boscia, Kathlynn Grate, NP  nitroGLYCERIN (NITROSTAT) 0.4 MG SL tablet Place 1 tablet (0.4 mg total) under the tongue every 5 (five) minutes as needed for chest pain. 01/07/18   Shaune Pollack, MD  olmesartan (BENICAR) 5 MG tablet Take 1 tablet (5 mg total) by mouth daily. 04/08/18   Carlean Jews, NP  traZODone (DESYREL) 50 MG tablet Take 0.5-1 tablets (25-50 mg total) by mouth at bedtime as needed for sleep. 04/06/18   Carlean Jews, NP  VENTOLIN HFA 108 (90 Base) MCG/ACT inhaler Inhale 1-2 puffs into the lungs daily. 04/13/18   Carlean Jews, NP    Review of Systems    Exertional angina as outlined above.  She denies dyspnea, palpitations, PND, orthopnea, dizziness, syncope, edema, or early satiety.  All other systems reviewed and are otherwise negative except as noted above.  Physical Exam    VS:  BP 120/60 (BP Location: Left Arm, Patient Position: Sitting, Cuff Size: Normal)   Pulse (!) 58   Ht 5\' 2"  (1.575 m)   Wt 190 lb 8 oz (86.4 kg)   BMI 34.84 kg/m  , BMI Body mass index is 34.84 kg/m. GEN: Well nourished, well developed, in no acute distress. HEENT: normal. Neck: Supple, no JVD, carotid bruits, or masses. Cardiac: RRR, no murmurs, rubs, or gallops. No clubbing, cyanosis, edema.  Radials/DP/PT 2+ and equal bilaterally.  Respiratory:  Respirations regular and unlabored, clear to auscultation bilaterally. GI: Soft, nontender, nondistended, BS + x  4. MS: no deformity or atrophy. Skin: warm and dry, no rash. Neuro:  Strength and sensation are intact. Psych: Normal affect.  Accessory Clinical Findings    ECG personally reviewed by me today -sinus bradycardia, 58- no acute changes.  Assessment & Plan    1.  Coronary artery disease with stable angina: Ms. Galvin Proffer suffered a non-STEMI in August and underwent diagnostic catheterization revealing severe stenosis of a nondominant RCA as well as severe distal disease in the left circumflex, though it was only a 1.5 mm vessel at that point.  She has been medically managed with aspirin, statin, beta-blocker, Plavix, ARB, and nitrate therapy, which has been titrated to 90 mg twice daily.  Despite this, she is noted recurrence of mild to moderate exertional angina several days per week, lasting a few minutes, and resolving with rest.  We discussed options for management today.  The higher dose of  isosorbide does sometimes cause headaches therefore further titration is not reasonable.  I will add Ranexa 500 mg twice daily and if she has improvement in symptoms, I have told her that she can come down on isosorbide mononitrate to 60 mg daily.  If no improvement, we can titrate Ranexa to 1000 mg twice daily.  I have encouraged her to reconsider cardiac rehabilitation.  2.  Essential hypertension: Stable on beta-blocker, ARB, and HCTZ therapy.  3.  Hyperlipidemia: LDL was 95 at the time of her event.  She remains on high potency statin.  She is not fasting today.  We will arrange for outpatient lipids and LFTs.  4.  Type 2 diabetes mellitus: A1c 5.7 in September.  On metformin and managed by primary care.  5.  Tobacco abuse: She remains off of cigarettes.  6.  Disposition: Follow-up in 1 month or sooner if necessary.   Nicolasa Duckinghristopher Berge, NP 04/29/2018, 10:04 AM

## 2018-05-02 ENCOUNTER — Other Ambulatory Visit: Payer: Self-pay | Admitting: Nurse Practitioner

## 2018-05-12 ENCOUNTER — Encounter: Payer: Self-pay | Admitting: Nurse Practitioner

## 2018-05-12 ENCOUNTER — Ambulatory Visit: Payer: BLUE CROSS/BLUE SHIELD | Admitting: Nurse Practitioner

## 2018-05-12 VITALS — BP 124/57 | HR 60 | Resp 16 | Ht 62.0 in | Wt 191.0 lb

## 2018-05-12 DIAGNOSIS — J0140 Acute pansinusitis, unspecified: Secondary | ICD-10-CM

## 2018-05-12 DIAGNOSIS — M62830 Muscle spasm of back: Secondary | ICD-10-CM

## 2018-05-12 DIAGNOSIS — I10 Essential (primary) hypertension: Secondary | ICD-10-CM

## 2018-05-12 DIAGNOSIS — E119 Type 2 diabetes mellitus without complications: Secondary | ICD-10-CM | POA: Diagnosis not present

## 2018-05-12 LAB — POCT GLYCOSYLATED HEMOGLOBIN (HGB A1C): HEMOGLOBIN A1C: 5.7 % — AB (ref 4.0–5.6)

## 2018-05-12 MED ORDER — BISOPROLOL FUMARATE 5 MG PO TABS: 5.0000 mg | ORAL_TABLET | Freq: Every day | ORAL | 3 refills | Status: DC

## 2018-05-12 MED ORDER — CYCLOBENZAPRINE HCL 5 MG PO TABS: 5.0000 mg | ORAL_TABLET | Freq: Three times a day (TID) | ORAL | 1 refills | Status: DC | PRN

## 2018-05-12 MED ORDER — AMOXICILLIN 875 MG PO TABS: 875.0000 mg | ORAL_TABLET | Freq: Two times a day (BID) | ORAL | 0 refills | Status: DC

## 2018-05-12 NOTE — Progress Notes (Signed)
[facility] [address] [place], [place] [address]  Internal MEDICINE  Office Visit Note  Patient Name: Sherry Torres  [id number]  [id number]  Date of Service: 05/15/2018  Chief Complaint  Patient presents with  . Diabetes  . Hypertension    The patient is here for routine follow up visit. Blood sugars are very well controlled. HgbA1c is 5.7 today. Was 5.7 at last visit. Will cut metformin to 250mg  at night. Will consider cutting it out altogether at next visit if HgbA1c is still low. She continues to have some shortness of breath, especially with exertion. Working with her cardiologist to get these symptoms under control. Added Ranexa at her last visit. Patient tolerating well, but not noticing a lot of improvement yet. Was started about 2 weeks ago.  Has intermittent left shoulder and neck tenderness and tightness. Uses cyclobenzaprine intermittently to reduce this pain. Does need refills for this today.  The patient c/o nasal congestion, cough, headache, and sinus pressure. Has been going on for several days. Some of her coworkers have had similar symptoms and started on antibiotics due to URI. Has tried taking tylenol for symptoms but has not helped.       Current Medication: Outpatient Encounter Medications as of 05/12/2018  Medication Sig Note  . aspirin 81 MG chewable tablet Chew 1 tablet (81 mg total) by mouth daily.   Marland Kitchen atorvastatin (LIPITOR) 80 MG tablet Take 1 tablet (80 mg total) by mouth daily at 6 PM.   . bisoprolol (ZEBETA) 5 MG tablet Take 1 tablet (5 mg total) by mouth daily.   Marland Kitchen buPROPion (ZYBAN) 150 MG 12 hr tablet Take 1 tablet (150 mg total) by mouth 2 (two) times daily.   . clopidogrel (PLAVIX) 75 MG tablet Take 1 tablet (75 mg total) by mouth daily with breakfast.   . cyclobenzaprine (FLEXERIL) 5 MG tablet Take 1 tablet (5 mg total) by mouth 3 (three) times daily as needed for muscle spasms.   . diclofenac sodium (VOLTAREN) 1 % GEL Apply 4 g  topically 4 (four) times daily. 01/05/2018: Insurance has not approved, did not pick up from pharmacy  . hydrochlorothiazide (HYDRODIURIL) 25 MG tablet Take 1 tablet (25 mg total) by mouth daily.   . isosorbide mononitrate (IMDUR) 60 MG 24 hr tablet Take 1.5 tablets (90 mg total) by mouth 2 (two) times daily.   . metFORMIN (GLUCOPHAGE) 500 MG tablet Take 0.5 tablets (250 mg total) by mouth 2 (two) times daily with a meal.   . nitroGLYCERIN (NITROSTAT) 0.4 MG SL tablet Place 1 tablet (0.4 mg total) under the tongue every 5 (five) minutes as needed for chest pain.   Marland Kitchen olmesartan (BENICAR) 5 MG tablet Take 1 tablet (5 mg total) by mouth daily.   . ranolazine (RANEXA) 500 MG 12 hr tablet Take 1 tablet (500 mg total) by mouth 2 (two) times daily.   . traZODone (DESYREL) 50 MG tablet Take 0.5-1 tablets (25-50 mg total) by mouth at bedtime as needed for sleep.   . VENTOLIN HFA 108 (90 Base) MCG/ACT inhaler Inhale 1-2 puffs into the lungs daily.   . [DISCONTINUED] bisoprolol (ZEBETA) 5 MG tablet Take 1 tablet (5 mg total) by mouth daily.   . [DISCONTINUED] cyclobenzaprine (FLEXERIL) 5 MG tablet Take 1 tablet (5 mg total) by mouth 3 (three) times daily as needed for muscle spasms.   Marland Kitchen amoxicillin (AMOXIL) 875 MG tablet Take 1 tablet (875 mg total) by mouth 2 (two) times daily.  No facility-administered encounter medications on file as of 05/12/2018.     Surgical History: Past Surgical History:  Procedure Laterality Date  . LEFT HEART CATH AND CORONARY ANGIOGRAPHY N/A 01/06/2018   Procedure: LEFT HEART CATH AND CORONARY ANGIOGRAPHY;  Surgeon: Iran Ouch, MD;  Location: [facility] INVASIVE CV LAB;  Service: Cardiovascular;  Laterality: N/A;    Medical History: Past Medical History:  Diagnosis Date  . Anxiety   . Asthma   . CAD (coronary artery disease)    a. 12/2017 NSTEMI;  b. 12/2017 MV: basal and mid inflat ischemia; c. 12/2017 Cath: LM nl, LAD 30ost, D1 mild dzs, LCX 40ost, 95d, RCA 15m  (nondominant). LCX felt to be culprit but only 1.82mm vessel-->Med rx.  . Diastolic dysfunction    a. 12/2017 Echo: EF 55-60%, no rwma, Gr1 DD; b. 01/2018 Echo: EF 60-65%, gr2 DD, no rwma, mild MR, nl RV fxn.  . Hyperlipidemia LDL goal <70   . Hypertension   . Recurrent major depressive disorder, in partial remission (HCC) 09/17/2017  . Tobacco abuse   . Type 2 diabetes mellitus (HCC)     Family History: Family History  Problem Relation Age of Onset  . Cancer Mother   . Breast cancer Mother 2  . Hyperlipidemia Father     Social History   Socioeconomic History  . Marital status: Married    Spouse name: Not on file  . Number of children: Not on file  . Years of education: Not on file  . Highest education level: Not on file  Occupational History  . Not on file  Social Needs  . Financial resource strain: Not on file  . Food insecurity:    Worry: Not on file    Inability: Not on file  . Transportation needs:    Medical: Not on file    Non-medical: Not on file  Tobacco Use  . Smoking status: Former Smoker    Types: Cigarettes  . Smokeless tobacco: Never Used  . Tobacco comment: hasnt smoked in 1 wk  Substance and Sexual Activity  . Alcohol use: No    Frequency: Never  . Drug use: No  . Sexual activity: Not on file  Lifestyle  . Physical activity:    Days per week: Not on file    Minutes per session: Not on file  . Stress: Not on file  Relationships  . Social connections:    Talks on phone: Not on file    Gets together: Not on file    Attends religious service: Not on file    Active member of club or organization: Not on file    Attends meetings of clubs or organizations: Not on file    Relationship status: Not on file  . Intimate partner violence:    Fear of current or ex partner: Not on file    Emotionally abused: Not on file    Physically abused: Not on file    Forced sexual activity: Not on file  Other Topics Concern  . Not on file  Social History Narrative   . Not on file      Review of Systems  Constitutional: Positive for fatigue. Negative for activity change, appetite change, fever and unexpected weight change.  HENT: Positive for congestion, postnasal drip, rhinorrhea, sinus pressure and voice change.   Eyes: Negative.   Respiratory: Negative for cough, chest tightness, shortness of breath and wheezing.   Cardiovascular: Positive for chest pain. Negative for palpitations.  Blood pressure well managed. Having intermittent chest pain requiring rest and treatment with nitroglycerin.   Gastrointestinal: Negative for abdominal pain, constipation, diarrhea, nausea and vomiting.  Endocrine: Negative for cold intolerance, heat intolerance, polydipsia and polyuria.       Improving blood sugars.   Genitourinary: Negative for frequency.  Musculoskeletal: Positive for arthralgias. Negative for neck pain.       Lleft shoulder pain has resolved since her last visit .  Skin: Negative for rash.  Allergic/Immunologic: Negative for environmental allergies.  Neurological: Negative for dizziness, weakness, light-headedness and headaches.  Hematological: Negative for adenopathy. Does not bruise/bleed easily.  Psychiatric/Behavioral: Positive for sleep disturbance. Negative for agitation. The patient is nervous/anxious.     Today's Vitals   05/12/18 1500  BP: (!) 124/57  Pulse: 60  Resp: 16  SpO2: 95%  Weight: 191 lb (86.6 kg)  Height: 5\' 2"  (1.575 m)    Physical Exam Vitals signs and nursing note reviewed.  Constitutional:      General: She is not in acute distress.    Appearance: Normal appearance. She is well-developed. She is not diaphoretic.  HENT:     Head: Normocephalic and atraumatic.     Right Ear: Tympanic membrane is bulging.     Left Ear: Tympanic membrane is bulging.     Nose: Nasal tenderness, congestion and rhinorrhea present. Rhinorrhea is clear.     Mouth/Throat:     Pharynx: No oropharyngeal exudate.  Eyes:      Conjunctiva/sclera: Conjunctivae normal.     Pupils: Pupils are equal, round, and reactive to light.  Neck:     Musculoskeletal: Normal range of motion and neck supple.     Thyroid: No thyromegaly.     Vascular: No carotid bruit or JVD.     Trachea: No tracheal deviation.  Cardiovascular:     Rate and Rhythm: Normal rate and regular rhythm.     Pulses:          Dorsalis pedis pulses are 2+ on the right side and 2+ on the left side.       Posterior tibial pulses are 2+ on the right side and 2+ on the left side.     Heart sounds: Normal heart sounds. No murmur. No friction rub. No gallop.   Pulmonary:     Effort: Pulmonary effort is normal. No respiratory distress.     Breath sounds: Normal breath sounds. No wheezing or rales.     Comments: Congested, non-productive cough present.  Chest:     Chest wall: No tenderness.     Breasts:        Right: No inverted nipple, mass, nipple discharge, skin change or tenderness.        Left: No inverted nipple, mass, nipple discharge, skin change or tenderness.  Abdominal:     General: Bowel sounds are normal.     Palpations: Abdomen is soft.     Tenderness: There is no abdominal tenderness.  Musculoskeletal: Normal range of motion.     Right foot: Normal range of motion. No deformity.     Left foot: Normal range of motion. No deformity.     Comments: Left shoulder pain has resolved. She has good ROM and strength of left shoulder .  Feet:     Right foot:     Protective Sensation: 10 sites tested. 10 sites sensed.     Skin integrity: No ulcer, warmth, callus or dry skin.     Left foot:  Protective Sensation: 10 sites tested. 10 sites sensed.     Skin integrity: No ulcer, warmth, callus or dry skin.  Lymphadenopathy:     Cervical: Cervical adenopathy present.  Skin:    General: Skin is warm and dry.     Capillary Refill: Capillary refill takes less than 2 seconds.  Neurological:     General: No focal deficit present.     Mental Status:  She is alert and oriented to person, place, and time.     Cranial Nerves: No cranial nerve deficit.  Psychiatric:        Behavior: Behavior normal.        Thought Content: Thought content normal.        Judgment: Judgment normal.     Assessment/Plan: 1. Type 2 diabetes mellitus without complication, without long-term current use of insulin (HCC) - POCT HgB A1C 5.7 today. Reduce metformin to 250mg  daily. Continue to follow ADA diet and monitor sugars closely. Consider holding metformin if HgbA1c remains controlled at next visit.   2. Acute non-recurrent pansinusitis Start amoxicillin 875mg  twice daily for 10 days. Rest and increase fluids. Recommend use of coricidin HBP to relieve symptoms.  - amoxicillin (AMOXIL) 875 MG tablet; Take 1 tablet (875 mg total) by mouth 2 (two) times daily.  Dispense: 20 tablet; Refill: 0  3. Essential hypertension Stable. Continue BP medication as prescribed. Continue regular visits with cardiology as scheduled.  - bisoprolol (ZEBETA) 5 MG tablet; Take 1 tablet (5 mg total) by mouth daily.  Dispense: 90 tablet; Refill: 3  4. Muscle spasm of back May take flexeril 5mg  up to three times daily as needed for muscle pain and tightness. Advised she use low heating pad to affected area to help reduce muscle pain/tightness.  - cyclobenzaprine (FLEXERIL) 5 MG tablet; Take 1 tablet (5 mg total) by mouth 3 (three) times daily as needed for muscle spasms.  Dispense: 90 tablet; Refill: 1  General Counseling: Sherry Torres verbalizes understanding of the findings of todays visit and agrees with plan of treatment. I have discussed any further diagnostic evaluation that may be needed or ordered today. We also reviewed her medications today. she has been encouraged to call the office with any questions or concerns that should arise related to todays visit.  Hypertension Counseling:   The following hypertensive lifestyle modification were recommended and discussed:  1. Limiting  alcohol intake to less than 1 oz/day of ethanol:(24 oz of beer or 8 oz of wine or 2 oz of 100-proof whiskey). 2. Take baby ASA 81 mg daily. 3. Importance of regular aerobic exercise and losing weight. 4. Reduce dietary saturated fat and cholesterol intake for overall cardiovascular health. 5. Maintaining adequate dietary potassium, calcium, and magnesium intake. 6. Regular monitoring of the blood pressure. 7. Reduce sodium intake to less than 100 mmol/day (less than 2.3 gm of sodium or less than 6 gm of sodium choride)   Rest and increase fluids. Continue using OTC medication to control symptoms.   This patient was seen by Vincent GrosHeather Virgle Arth FNP Collaboration with Dr Lyndon CodeFozia M Khan as a part of collaborative care agreement  Orders Placed This Encounter  Procedures  . POCT HgB A1C    Meds ordered this encounter  Medications  . bisoprolol (ZEBETA) 5 MG tablet    Sig: Take 1 tablet (5 mg total) by mouth daily.    Dispense:  90 tablet    Refill:  3    Order Specific Question:   Supervising Provider  Answer:   Lyndon CodeKHAN, FOZIA M [[id number]]  . cyclobenzaprine (FLEXERIL) 5 MG tablet    Sig: Take 1 tablet (5 mg total) by mouth 3 (three) times daily as needed for muscle spasms.    Dispense:  90 tablet    Refill:  1    Order Specific Question:   Supervising Provider    Answer:   Lyndon CodeKHAN, FOZIA M [[id number]]  . amoxicillin (AMOXIL) 875 MG tablet    Sig: Take 1 tablet (875 mg total) by mouth 2 (two) times daily.    Dispense:  20 tablet    Refill:  0    Order Specific Question:   Supervising Provider    Answer:   Lyndon CodeKHAN, FOZIA M [[id number]]    Time spent: 6925 Minutes      Dr Lyndon CodeFozia M Khan Internal medicine

## 2018-05-15 DIAGNOSIS — J0140 Acute pansinusitis, unspecified: Secondary | ICD-10-CM | POA: Insufficient documentation

## 2018-05-15 MED ORDER — METFORMIN HCL 500 MG PO TABS: 250.0000 mg | ORAL_TABLET | Freq: Every day | ORAL | 3 refills | Status: DC

## 2018-07-01 ENCOUNTER — Encounter: Payer: Self-pay | Admitting: Internal Medicine

## 2018-07-01 ENCOUNTER — Ambulatory Visit (INDEPENDENT_AMBULATORY_CARE_PROVIDER_SITE_OTHER): Payer: BLUE CROSS/BLUE SHIELD | Admitting: Internal Medicine

## 2018-07-01 VITALS — BP 150/70 | HR 64 | Ht 61.5 in | Wt 194.0 lb

## 2018-07-01 DIAGNOSIS — I25118 Atherosclerotic heart disease of native coronary artery with other forms of angina pectoris: Secondary | ICD-10-CM | POA: Diagnosis not present

## 2018-07-01 DIAGNOSIS — E785 Hyperlipidemia, unspecified: Secondary | ICD-10-CM | POA: Diagnosis not present

## 2018-07-01 DIAGNOSIS — I10 Essential (primary) hypertension: Secondary | ICD-10-CM

## 2018-07-01 MED ORDER — RANOLAZINE ER 1000 MG PO TB12: 1000.0000 mg | ORAL_TABLET | Freq: Two times a day (BID) | ORAL | 3 refills | Status: DC

## 2018-07-01 NOTE — Patient Instructions (Signed)
Medication Instructions:  Your physician has recommended you make the following change in your medication:  1- INCREASE Ranexa to 1000 mg by mouth two times a day.   If you need a refill on your cardiac medications before your next appointment, please call your pharmacy.   Lab work: none If you have labs (blood work) drawn today and your tests are completely normal, you will receive your results only by: Marland Kitchen MyChart Message (if you have MyChart) OR . A paper copy in the mail If you have any lab test that is abnormal or we need to change your treatment, we will call you to review the results.  Testing/Procedures: none  Follow-Up: At [facility], you and your health needs are our priority.  As part of our continuing mission to provide you with exceptional heart care, we have created designated Provider Care Teams.  These Care Teams include your primary Cardiologist (physician) and Advanced Practice Providers (APPs -  Physician Assistants and Nurse Practitioners) who all work together to provide you with the care you need, when you need it. You will need a follow up appointment in 3 months.  Please call our office 2 months in advance to schedule this appointment.  You may see Yvonne Kendall, MD or one of the following Advanced Practice Providers on your designated Care Team:   Nicolasa Ducking, NP Eula Listen, PA-C . Marisue Ivan, PA-C  Any Other Special Instructions Will Be Listed Below (If Applicable).  Please call us to let us know whether or not your chest pain goes away after a few weeks on the increased dose of Ranexa.

## 2018-07-01 NOTE — Progress Notes (Signed)
Follow-up Outpatient Visit Date: 07/01/2018  Primary Care Provider: Carlean Jews, NP [address] [place] [address]  Chief Complaint: Chest pain  HPI:  Sherry Torres is a 58 y.o. year-old female with history of coronary artery disease, hypertension, hyperlipidemia, diabetes mellitus, and anxiety, who presents for follow-up of stable angina.  She was last seen in our office by Ward Givens, NP, in 04/2018.  Previous catheterization showed severe disease involving nondominant RCA and small distal branch of the LCx.  Both lesions were felt too small for PCI.  She noted continued intermittent chest discomfort despite antianginal therapy with bisoprolol and isosorbide mononitrate at her last visit.  Therefore, ranolazine 500 mg twice daily was added.  Today, Sherry Torres notes that her chest pain has improved with addition of ranolazine, though she continues to have tightness in the upper back, shoulders, and chest with activity such as cleaning around the house.  The pain resolves promptly with rest.  She has also taken SL NTG in the past with relief of chest pain.  It is reminiscent of what she felt at the time of her NSTEMI in 12/2017.  She denies shortness of breath, palpitations, lightheadedess, and edema.  She reports being compliant with her medications.  Insurance recently changed and [facility] is no longer in network (neither is [facility]).  --------------------------------------------------------------------------------------------------  Past Medical History:  Diagnosis Date  . Anxiety   . Asthma   . CAD (coronary artery disease)    a. 12/2017 NSTEMI;  b. 12/2017 MV: basal and mid inflat ischemia; c. 12/2017 Cath: LM nl, LAD 30ost, D1 mild dzs, LCX 40ost, 95d, RCA 75m (nondominant). LCX felt to be culprit but only 1.68mm vessel-->Med rx.  . Diastolic dysfunction    a. 12/2017 Echo: EF 55-60%, no rwma, Gr1 DD; b. 01/2018 Echo: EF 60-65%, gr2 DD, no rwma, mild MR, nl RV fxn.  .  Hyperlipidemia LDL goal <70   . Hypertension   . Recurrent major depressive disorder, in partial remission (HCC) 09/17/2017  . Tobacco abuse   . Type 2 diabetes mellitus (HCC)    Past Surgical History:  Procedure Laterality Date  . LEFT HEART CATH AND CORONARY ANGIOGRAPHY N/A 01/06/2018   Procedure: LEFT HEART CATH AND CORONARY ANGIOGRAPHY;  Surgeon: Iran Ouch, MD;  Location: [facility] INVASIVE CV LAB;  Service: Cardiovascular;  Laterality: N/A;    Current Meds  Medication Sig  . aspirin 81 MG chewable tablet Chew 1 tablet (81 mg total) by mouth daily.  Marland Kitchen atorvastatin (LIPITOR) 80 MG tablet Take 1 tablet (80 mg total) by mouth daily at 6 PM.  . bisoprolol (ZEBETA) 5 MG tablet Take 1 tablet (5 mg total) by mouth daily.  Marland Kitchen buPROPion (ZYBAN) 150 MG 12 hr tablet Take 1 tablet (150 mg total) by mouth 2 (two) times daily.  . clopidogrel (PLAVIX) 75 MG tablet Take 1 tablet (75 mg total) by mouth daily with breakfast.  . cyclobenzaprine (FLEXERIL) 5 MG tablet Take 1 tablet (5 mg total) by mouth 3 (three) times daily as needed for muscle spasms.  . isosorbide mononitrate (IMDUR) 60 MG 24 hr tablet Take 1.5 tablets (90 mg total) by mouth 2 (two) times daily.  . metFORMIN (GLUCOPHAGE) 500 MG tablet Take 0.5 tablets (250 mg total) by mouth daily with breakfast.  . nitroGLYCERIN (NITROSTAT) 0.4 MG SL tablet Place 1 tablet (0.4 mg total) under the tongue every 5 (five) minutes as needed for chest pain.  Marland Kitchen olmesartan (BENICAR) 5 MG tablet  Take 1 tablet (5 mg total) by mouth daily.  . ranolazine (RANEXA) 500 MG 12 hr tablet Take 1 tablet (500 mg total) by mouth 2 (two) times daily.  . VENTOLIN HFA 108 (90 Base) MCG/ACT inhaler Inhale 1-2 puffs into the lungs daily.    Allergies: Codeine  Social History   Tobacco Use  . Smoking status: Former Smoker    Types: Cigarettes  . Smokeless tobacco: Never Used  . Tobacco comment: hasnt smoked in 1 wk  Substance Use Topics  . Alcohol use: No     Frequency: Never  . Drug use: No    Family History  Problem Relation Age of Onset  . Cancer Mother   . Breast cancer Mother 51  . Hyperlipidemia Father     Review of Systems: A 12-system review of systems was performed and was negative except as noted in the HPI.  --------------------------------------------------------------------------------------------------  Physical Exam: BP (!) 150/70 (BP Location: Left Arm, Patient Position: Sitting, Cuff Size: Normal)   Pulse 64   Ht 5' 1.5" (1.562 m)   Wt 194 lb (88 kg)   BMI 36.06 kg/m   General:  NAD HEENT: No conjunctival pallor or scleral icterus. Moist mucous membranes.  OP clear. Neck: Supple without lymphadenopathy, thyromegaly, JVD, or HJR. Lungs: Normal work of breathing. Clear to auscultation bilaterally without wheezes or crackles. Heart: Regular rate and rhythm without murmurs, rubs, or gallops. Unable to assess PMI due to body habitus. Abd: Bowel sounds present. Soft, NT/ND.  Unable to assess HSM due to body habitus. Ext: No lower extremity edema. Radial, PT, and DP pulses are 2+ bilaterally. Skin: Warm and dry without rash.  EKG:  NSR with low voltage and non-specific ST changes.  Lab Results  Component Value Date   WBC 8.7 01/07/2018   HGB 11.8 (L) 01/07/2018   HCT 34.0 (L) 01/07/2018   MCV 88.5 01/07/2018   PLT 205 01/07/2018    Lab Results  Component Value Date   NA 140 02/11/2018   K 4.5 02/11/2018   CL 101 02/11/2018   CO2 24 02/11/2018   BUN 16 02/11/2018   CREATININE 0.85 02/11/2018   GLUCOSE 252 (H) 02/11/2018   ALT 16 01/05/2018    Lab Results  Component Value Date   CHOL 164 01/06/2018   HDL 33 (L) 01/06/2018   LDLCALC 95 01/06/2018   TRIG 182 (H) 01/06/2018   CHOLHDL 5.0 01/06/2018    --------------------------------------------------------------------------------------------------  ASSESSMENT AND PLAN: Coronary artery disease with stable angina Symptoms somewhat improved with  addition of ranolazine, though she continues to have pain with modest activity, consistent with NYHA class III angina.  We discussed repeat catheterization and possible PCI of distal LCx disease, though small vessel size may make intervention difficult (images from cath in 12/2017 personally reviewed again today).  Due to insurance issues, Sherry Torres would like to defer repeat cath at this time.  We have agreed to increase ranolazine to 1000 mg BID.  She will contact us in ~2 weeks to update [place] on her symptoms.  If she continues to have class III angina, I will refer her to [facility] (in-network) for possible PCI of the LCx.  In the meantime, we will continue aggressive secondary prevention, including DAPT with ASA and clopidogrel.  Hypertension BP mildly elevated today but typically normal (or even a little or low) at prior visits.  We will defer medication changes today.  Sodium restriction was encouraged.  Hyperlipidemia LDL not optimally controlled on last check  in 12/2017.  She is tolerating atorvastatin 80 mg dialy well.  Repeat lipid panel planned.  Follow-up: Return to clinic in 3 months.  Yvonne Kendallhristopher Sharona Rovner, MD 07/01/2018 3:26 PM

## 2018-07-02 ENCOUNTER — Encounter: Payer: Self-pay | Admitting: Internal Medicine

## 2018-07-02 DIAGNOSIS — E1169 Type 2 diabetes mellitus with other specified complication: Secondary | ICD-10-CM | POA: Insufficient documentation

## 2018-07-02 DIAGNOSIS — E785 Hyperlipidemia, unspecified: Secondary | ICD-10-CM | POA: Insufficient documentation

## 2018-07-02 DIAGNOSIS — I25118 Atherosclerotic heart disease of native coronary artery with other forms of angina pectoris: Secondary | ICD-10-CM | POA: Insufficient documentation

## 2018-07-05 ENCOUNTER — Telehealth: Payer: Self-pay

## 2018-07-05 ENCOUNTER — Other Ambulatory Visit: Payer: Self-pay

## 2018-07-05 NOTE — Telephone Encounter (Signed)
Spoke with pt she is no longer taking trazadone and send denied pres back to [facility]

## 2018-07-05 NOTE — Telephone Encounter (Signed)
lmom to call us back regarding her med  clarification

## 2018-07-06 ENCOUNTER — Other Ambulatory Visit: Payer: Self-pay

## 2018-07-06 DIAGNOSIS — I10 Essential (primary) hypertension: Secondary | ICD-10-CM

## 2018-07-06 MED ORDER — OLMESARTAN MEDOXOMIL 5 MG PO TABS: 5.0000 mg | ORAL_TABLET | Freq: Every day | ORAL | 3 refills | Status: DC

## 2018-08-03 ENCOUNTER — Other Ambulatory Visit: Payer: Self-pay

## 2018-08-03 MED ORDER — VENTOLIN HFA 108 (90 BASE) MCG/ACT IN AERS: 1.0000 | INHALATION_SPRAY | Freq: Every day | RESPIRATORY_TRACT | 2 refills | Status: DC

## 2018-08-12 ENCOUNTER — Encounter: Payer: Self-pay | Admitting: Nurse Practitioner

## 2018-08-12 ENCOUNTER — Other Ambulatory Visit: Payer: Self-pay

## 2018-08-12 ENCOUNTER — Ambulatory Visit: Payer: BLUE CROSS/BLUE SHIELD | Admitting: Nurse Practitioner

## 2018-08-12 VITALS — BP 137/72

## 2018-08-12 DIAGNOSIS — E119 Type 2 diabetes mellitus without complications: Secondary | ICD-10-CM | POA: Diagnosis not present

## 2018-08-12 DIAGNOSIS — F411 Generalized anxiety disorder: Secondary | ICD-10-CM | POA: Diagnosis not present

## 2018-08-12 DIAGNOSIS — I10 Essential (primary) hypertension: Secondary | ICD-10-CM

## 2018-08-12 NOTE — Progress Notes (Signed)
[facility] [address] [place], [place] [address]  Internal MEDICINE  Office Visit Note  Patient Name: Sherry Torres  [id number]  [id number]  Date of Service: 08/12/2018  Chief Complaint  Patient presents with  . Diabetes  . Anxiety    The patient states that she is doing well. Having increased anxiety since the concern of COVID 19. She is at higher risk to contract this than most as she has underlying heart condition. she continues to work, as she works in Stage [employer]. Trying to self-quarantine as much as possible and practicing good overall hand hygeine. Takes wellbutrin 150mg  daily. Was unaware she could take this twice daily.  States that blood sugars are generally doing well. Running in 90s to low 100s. Blood pressure also well managed.      Current Medication: Outpatient Encounter Medications as of 08/12/2018  Medication Sig  . aspirin 81 MG chewable tablet Chew 1 tablet (81 mg total) by mouth daily.  Marland Kitchen atorvastatin (LIPITOR) 80 MG tablet Take 1 tablet (80 mg total) by mouth daily at 6 PM.  . bisoprolol (ZEBETA) 5 MG tablet Take 1 tablet (5 mg total) by mouth daily.  Marland Kitchen buPROPion (ZYBAN) 150 MG 12 hr tablet Take 1 tablet (150 mg total) by mouth 2 (two) times daily.  . clopidogrel (PLAVIX) 75 MG tablet Take 1 tablet (75 mg total) by mouth daily with breakfast.  . cyclobenzaprine (FLEXERIL) 5 MG tablet Take 1 tablet (5 mg total) by mouth 3 (three) times daily as needed for muscle spasms.  . hydrochlorothiazide (HYDRODIURIL) 25 MG tablet Take 1 tablet (25 mg total) by mouth daily.  . isosorbide mononitrate (IMDUR) 60 MG 24 hr tablet Take 1.5 tablets (90 mg total) by mouth 2 (two) times daily.  . metFORMIN (GLUCOPHAGE) 500 MG tablet Take 0.5 tablets (250 mg total) by mouth daily with breakfast.  . nitroGLYCERIN (NITROSTAT) 0.4 MG SL tablet Place 1 tablet (0.4 mg total) under the tongue every 5 (five) minutes as needed for chest pain.  Marland Kitchen olmesartan (BENICAR) 5  MG tablet Take 1 tablet (5 mg total) by mouth daily.  . ranolazine (RANEXA) 1000 MG SR tablet Take 1 tablet (1,000 mg total) by mouth 2 (two) times daily.  . VENTOLIN HFA 108 (90 Base) MCG/ACT inhaler Inhale 1-2 puffs into the lungs daily.   No facility-administered encounter medications on file as of 08/12/2018.     Surgical History: Past Surgical History:  Procedure Laterality Date  . LEFT HEART CATH AND CORONARY ANGIOGRAPHY N/A 01/06/2018   Procedure: LEFT HEART CATH AND CORONARY ANGIOGRAPHY;  Surgeon: Iran Ouch, MD;  Location: [facility] INVASIVE CV LAB;  Service: Cardiovascular;  Laterality: N/A;    Medical History: Past Medical History:  Diagnosis Date  . Anxiety   . Asthma   . CAD (coronary artery disease)    a. 12/2017 NSTEMI;  b. 12/2017 MV: basal and mid inflat ischemia; c. 12/2017 Cath: LM nl, LAD 30ost, D1 mild dzs, LCX 40ost, 95d, RCA 12m (nondominant). LCX felt to be culprit but only 1.46mm vessel-->Med rx.  . Diastolic dysfunction    a. 12/2017 Echo: EF 55-60%, no rwma, Gr1 DD; b. 01/2018 Echo: EF 60-65%, gr2 DD, no rwma, mild MR, nl RV fxn.  . Hyperlipidemia LDL goal <70   . Hypertension   . Recurrent major depressive disorder, in partial remission (HCC) 09/17/2017  . Tobacco abuse   . Type 2 diabetes mellitus (HCC)     Family History: Family History  Problem Relation Age of Onset  . Cancer Mother   . Breast cancer Mother 67  . Hyperlipidemia Father     Social History   Socioeconomic History  . Marital status: Married    Spouse name: Not on file  . Number of children: Not on file  . Years of education: Not on file  . Highest education level: Not on file  Occupational History  . Not on file  Social Needs  . Financial resource strain: Not on file  . Food insecurity:    Worry: Not on file    Inability: Not on file  . Transportation needs:    Medical: Not on file    Non-medical: Not on file  Tobacco Use  . Smoking status: Former Smoker    Types:  Cigarettes  . Smokeless tobacco: Never Used  . Tobacco comment: hasnt smoked in 1 wk  Substance and Sexual Activity  . Alcohol use: No    Frequency: Never  . Drug use: No  . Sexual activity: Not on file  Lifestyle  . Physical activity:    Days per week: Not on file    Minutes per session: Not on file  . Stress: Not on file  Relationships  . Social connections:    Talks on phone: Not on file    Gets together: Not on file    Attends religious service: Not on file    Active member of club or organization: Not on file    Attends meetings of clubs or organizations: Not on file    Relationship status: Not on file  . Intimate partner violence:    Fear of current or ex partner: Not on file    Emotionally abused: Not on file    Physically abused: Not on file    Forced sexual activity: Not on file  Other Topics Concern  . Not on file  Social History Narrative  . Not on file      Review of Systems  Constitutional: Negative for activity change, appetite change, fatigue, fever and unexpected weight change.  HENT: Negative for congestion, postnasal drip, rhinorrhea, sinus pressure and voice change.   Respiratory: Negative for cough, chest tightness, shortness of breath and wheezing.   Cardiovascular: Negative for chest pain and palpitations.       Blood pressure well managed. Having intermittent chest pain requiring rest and treatment with nitroglycerin.   Gastrointestinal: Negative for abdominal pain, constipation, diarrhea, nausea and vomiting.  Endocrine: Negative for cold intolerance, heat intolerance, polydipsia and polyuria.       Blood sugars are doing well.   Musculoskeletal: Positive for arthralgias. Negative for neck pain.       Lleft shoulder pain has resolved since her last visit .  Skin: Negative for rash.  Allergic/Immunologic: Negative for environmental allergies.  Neurological: Negative for dizziness, weakness, light-headedness and headaches.  Hematological: Negative  for adenopathy. Does not bruise/bleed easily.  Psychiatric/Behavioral: Positive for sleep disturbance. Negative for agitation. The patient is nervous/anxious.        Increased anxiety due to COVID 19 pandemic.     Vital Signs: BP 137/72    Assessment/Plan: 1. Type 2 diabetes mellitus without complication, without long-term current use of insulin (HCC) Per patient, blood sugars running in low 90s to low 100s. Doing well. Continue with metformin dosing as prescribed   2. Essential hypertension Well controlled. Continue bp medications as prescribed. Regular visits with cardiology as scheduled   3. Generalized anxiety disorder Encouraged her to try  taking second dose of wellbutrin during the day to help improve anxiety. Reassess at next visit.   General Counseling: Sherry Torres verbalizes understanding of the findings of todays visit and agrees with plan of treatment. I have discussed any further diagnostic evaluation that may be needed or ordered today. We also reviewed her medications today. she has been encouraged to call the office with any questions or concerns that should arise related to todays visit.  Diabetes Counseling:  1. Addition of ACE inh/ ARB'S for nephroprotection. Microalbumin is updated  2. Diabetic foot care, prevention of complications. Podiatry consult 3. Exercise and lose weight.  4. Diabetic eye examination, Diabetic eye exam is updated  5. Monitor blood sugar closlely. nutrition counseling.  6. Sign and symptoms of hypoglycemia including shaking sweating,confusion and headaches.  This patient was seen by Vincent Gros FNP Collaboration with Dr Lyndon Code as a part of collaborative care agreement  Time spent: 25 Minutes      Dr Lyndon Code Internal medicine

## 2018-08-25 ENCOUNTER — Other Ambulatory Visit: Payer: Self-pay

## 2018-08-25 DIAGNOSIS — E119 Type 2 diabetes mellitus without complications: Secondary | ICD-10-CM

## 2018-08-25 MED ORDER — METFORMIN HCL 500 MG PO TABS: 250.0000 mg | ORAL_TABLET | Freq: Every day | ORAL | 3 refills | Status: DC

## 2018-08-29 ENCOUNTER — Other Ambulatory Visit: Payer: Self-pay

## 2018-08-29 DIAGNOSIS — E119 Type 2 diabetes mellitus without complications: Secondary | ICD-10-CM

## 2018-08-29 MED ORDER — METFORMIN HCL 500 MG PO TABS: 250.0000 mg | ORAL_TABLET | Freq: Every day | ORAL | 3 refills | Status: DC

## 2018-09-06 ENCOUNTER — Other Ambulatory Visit: Payer: Self-pay

## 2018-09-06 DIAGNOSIS — E119 Type 2 diabetes mellitus without complications: Secondary | ICD-10-CM

## 2018-09-06 MED ORDER — METFORMIN HCL 500 MG PO TABS: 250.0000 mg | ORAL_TABLET | Freq: Every day | ORAL | 3 refills | Status: DC

## 2018-09-08 ENCOUNTER — Other Ambulatory Visit: Payer: Self-pay | Admitting: Nurse Practitioner

## 2018-09-08 DIAGNOSIS — F3341 Major depressive disorder, recurrent, in partial remission: Secondary | ICD-10-CM

## 2018-09-08 MED ORDER — BUPROPION HCL ER (SMOKING DET) 150 MG PO TB12: 150.0000 mg | ORAL_TABLET | Freq: Two times a day (BID) | ORAL | 5 refills | Status: DC

## 2018-09-15 ENCOUNTER — Telehealth: Payer: Self-pay | Admitting: *Deleted

## 2018-09-15 NOTE — Telephone Encounter (Signed)
Virtual Visit Pre-Appointment Phone Call  "(Name), I am calling you today to discuss your upcoming appointment. We are currently trying to limit exposure to the virus that causes COVID-19 by seeing patients at home rather than in the office."  1. "What is the BEST phone number to call the day of the visit?" - include this in appointment notes  2. "Do you have or have access to (through a family member/friend) a smartphone with video capability that we can use for your visit?" a. If yes - list this number in appt notes as "cell" (if different from BEST phone #) and list the appointment type as a VIDEO visit in appointment notes b. If no - list the appointment type as a PHONE visit in appointment notes  3. Confirm consent - "In the setting of the current Covid19 crisis, you are scheduled for a (VIDEO) visit with your provider on (09/22/2018) at (4:30 pm).  Just as we do with many in-office visits, in order for you to participate in this visit, we must obtain consent.  If you'd like, I can send this to your mychart (if signed up) or email for you to review.  Otherwise, I can obtain your verbal consent now.  All virtual visits are billed to your insurance company just like a normal visit would be.  By agreeing to a virtual visit, we'd like you to understand that the technology does not allow for your provider to perform an examination, and thus may limit your provider's ability to fully assess your condition. If your provider identifies any concerns that need to be evaluated in person, we will make arrangements to do so.  Finally, though the technology is pretty good, we cannot assure that it will always work on either your or our end, and in the setting of a video visit, we may have to convert it to a phone-only visit.  In either situation, we cannot ensure that we have a secure connection.  Are you willing to proceed?" YES  4. Advise patient to be prepared - "Two hours prior to your appointment, go  ahead and check your blood pressure, pulse, oxygen saturation, and your weight (if you have the equipment to check those) and write them all down. When your visit starts, your provider will ask you for this information. If you have an Apple Watch or Kardia device, please plan to have heart rate information ready on the day of your appointment. Please have a pen and paper handy nearby the day of the visit as well."  5. Give patient instructions for MyChart download to smartphone OR Doximity/Doxy.me as below if video visit (depending on what platform provider is using)  6. Inform patient they will receive a phone call 15 minutes prior to their appointment time (may be from unknown caller ID) so they should be prepared to answer    TELEPHONE CALL NOTE  Sherry Torres has been deemed a candidate for a follow-up tele-health visit to limit community exposure during the Covid-19 pandemic. I spoke with the patient via phone to ensure availability of phone/video source, confirm preferred email & phone number, and discuss instructions and expectations.  I reminded Sherry Torres to be prepared with any vital sign and/or heart rhythm information that could potentially be obtained via home monitoring, at the time of her visit. I reminded Sherry Torres to expect a phone call prior to her visit.  LOPEZ, MARINA C, CMA 09/15/2018 11:20 AM   INSTRUCTIONS FOR  DOWNLOADING THE MYCHART APP TO SMARTPHONE  - The patient must first make sure to have activated MyChart and know their login information - If Apple, go to [employer] and type in MyChart in the search bar and download the app. If Android, ask patient to go to [employer] and type in [place] in the search bar and download the app. The app is free but as with any other app downloads, their phone may require them to verify saved payment information or Apple/Android password.  - The patient will need to then log into the app with their MyChart  username and password, and select [facility] as their healthcare provider to link the account. When it is time for your visit, go to the MyChart app, find appointments, and click Begin Video Visit. Be sure to Select Allow for your device to access the Microphone and Camera for your visit. You will then be connected, and your provider will be with you shortly.  **If they have any issues connecting, or need assistance please contact MyChart service desk [phone])**  **If using a computer, in order to ensure the best quality for their visit they will need to use either of the following Internet Browsers: [employer], or Google Chrome**  IF USING DOXIMITY or DOXY.ME - The patient will receive a link just prior to their visit by text.     FULL LENGTH CONSENT FOR TELE-HEALTH VISIT   I hereby voluntarily request, consent and authorize [facility] and its employed or contracted physicians, physician assistants, nurse practitioners or other licensed health care professionals (the Practitioner), to provide me with telemedicine health care services (the "Services") as deemed necessary by the treating Practitioner. I acknowledge and consent to receive the Services by the Practitioner via telemedicine. I understand that the telemedicine visit will involve communicating with the Practitioner through live audiovisual communication technology and the disclosure of certain medical information by electronic transmission. I acknowledge that I have been given the opportunity to request an in-person assessment or other available alternative prior to the telemedicine visit and am voluntarily participating in the telemedicine visit.  I understand that I have the right to withhold or withdraw my consent to the use of telemedicine in the course of my care at any time, without affecting my right to future care or treatment, and that the Practitioner or I may terminate the telemedicine visit at any  time. I understand that I have the right to inspect all information obtained and/or recorded in the course of the telemedicine visit and may receive copies of available information for a reasonable fee.  I understand that some of the potential risks of receiving the Services via telemedicine include:  Marland Kitchen Delay or interruption in medical evaluation due to technological equipment failure or disruption; . Information transmitted may not be sufficient (e.g. poor resolution of images) to allow for appropriate medical decision making by the Practitioner; and/or  . In rare instances, security protocols could fail, causing a breach of personal health information.  Furthermore, I acknowledge that it is my responsibility to provide information about my medical history, conditions and care that is complete and accurate to the best of my ability. I acknowledge that Practitioner's advice, recommendations, and/or decision may be based on factors not within their control, such as incomplete or inaccurate data provided by me or distortions of diagnostic images or specimens that may result from electronic transmissions. I understand that the practice of medicine is not an exact science and that  Practitioner makes no warranties or guarantees regarding treatment outcomes. I acknowledge that I will receive a copy of this consent concurrently upon execution via email to the email address I last provided but may also request a printed copy by calling the office of [facility].    I understand that my insurance will be billed for this visit.   I have read or had this consent read to me. . I understand the contents of this consent, which adequately explains the benefits and risks of the Services being provided via telemedicine.  . I have been provided ample opportunity to ask questions regarding this consent and the Services and have had my questions answered to my satisfaction. . I give my informed consent for the services  to be provided through the use of telemedicine in my medical care  By participating in this telemedicine visit I agree to the above.

## 2018-09-21 NOTE — Progress Notes (Signed)
Virtual Visit via Video Note   This visit type was conducted due to national recommendations for restrictions regarding the COVID-19 Pandemic (e.g. social distancing) in an effort to limit this patient's exposure and mitigate transmission in our community.  Due to her co-morbid illnesses, this patient is at least at moderate risk for complications without adequate follow up.  This format is felt to be most appropriate for this patient at this time.  All issues noted in this document were discussed and addressed.  A limited physical exam was performed with this format.  Please refer to the patient's chart for her consent to telehealth for [facility].   Date:  09/22/2018   ID:  Sherry Torres, DOB 10/03/1960, MRN [id number]  Patient Location: Home Provider Location: Office  PCP:  Carlean JewsBoscia, Heather E, NP  Cardiologist:  Yvonne Kendallhristopher Nylan Nakatani, MD  Electrophysiologist:  None   Evaluation Performed:  Follow-Up Visit  Chief Complaint:  Follow-up chest pain  History of Present Illness:    Sherry Torres is a 58 y.o. female with  history of coronary artery disease, hypertension, hyperlipidemia, diabetes mellitus, and anxiety, who presents for follow-up of stable angina I last saw her in February, at which time she reported improvement in chest pain with addition of ranolazine, though she continued to have tightness in her upper back, shoulders, and chest when cleaning around the house.  Pain was reminiscent of what she felt at the time of her NSTEMI in 12/2017.  We agreed to increase ranolazine to 1000 mg twice daily.  We also discussed repeat catheterization and possible PCI to the small distal LCx stenosis if technically feasible, though [name] wished to defer this as [place] was no longer in her insurance network.  Today, [name] reports that her chest pain has almost completely resolved with escalation of ranolazine.  She has not needed to take any nitroglycerin since our last  visit.  She denies palpitations, shortness of breath, and edema.  She has experienced rare episodes of brief orthostatic lightheadedness.  Home BP has been 127-144/60-83).  The patient does not have symptoms concerning for COVID-19 infection (fever, chills, cough, or new shortness of breath).    Past Medical History:  Diagnosis Date  . Anxiety   . Asthma   . CAD (coronary artery disease)    a. 12/2017 NSTEMI;  b. 12/2017 MV: basal and mid inflat ischemia; c. 12/2017 Cath: LM nl, LAD 30ost, D1 mild dzs, LCX 40ost, 95d, RCA 6745m (nondominant). LCX felt to be culprit but only 1.25mm vessel-->Med rx.  . Diastolic dysfunction    a. 12/2017 Echo: EF 55-60%, no rwma, Gr1 DD; b. 01/2018 Echo: EF 60-65%, gr2 DD, no rwma, mild MR, nl RV fxn.  . Hyperlipidemia LDL goal <70   . Hypertension   . Recurrent major depressive disorder, in partial remission (HCC) 09/17/2017  . Tobacco abuse   . Type 2 diabetes mellitus (HCC)    Past Surgical History:  Procedure Laterality Date  . LEFT HEART CATH AND CORONARY ANGIOGRAPHY N/A 01/06/2018   Procedure: LEFT HEART CATH AND CORONARY ANGIOGRAPHY;  Surgeon: Iran OuchArida, Muhammad A, MD;  Location: [facility] INVASIVE CV LAB;  Service: Cardiovascular;  Laterality: N/A;     Current Meds  Medication Sig  . aspirin 81 MG chewable tablet Chew 1 tablet (81 mg total) by mouth daily.  Marland Kitchen. atorvastatin (LIPITOR) 80 MG tablet Take 1 tablet (80 mg total) by mouth daily at 6 PM.  . bisoprolol (ZEBETA) 5 MG  tablet Take 1 tablet (5 mg total) by mouth daily.  Marland Kitchen buPROPion (ZYBAN) 150 MG 12 hr tablet Take 1 tablet (150 mg total) by mouth 2 (two) times daily.  . clopidogrel (PLAVIX) 75 MG tablet Take 1 tablet (75 mg total) by mouth daily with breakfast.  . cyclobenzaprine (FLEXERIL) 5 MG tablet Take 1 tablet (5 mg total) by mouth 3 (three) times daily as needed for muscle spasms.  . hydrochlorothiazide (HYDRODIURIL) 25 MG tablet Take 1 tablet (25 mg total) by mouth daily.  . isosorbide mononitrate  (IMDUR) 60 MG 24 hr tablet Take 1.5 tablets (90 mg total) by mouth 2 (two) times daily.  . metFORMIN (GLUCOPHAGE) 500 MG tablet Take 0.5 tablets (250 mg total) by mouth daily with breakfast.  . nitroGLYCERIN (NITROSTAT) 0.4 MG SL tablet Place 1 tablet (0.4 mg total) under the tongue every 5 (five) minutes as needed for chest pain.  Marland Kitchen olmesartan (BENICAR) 5 MG tablet Take 1 tablet (5 mg total) by mouth daily.  . ranolazine (RANEXA) 1000 MG SR tablet Take 1 tablet (1,000 mg total) by mouth 2 (two) times daily.  . VENTOLIN HFA 108 (90 Base) MCG/ACT inhaler Inhale 1-2 puffs into the lungs daily.  . [DISCONTINUED] olmesartan (BENICAR) 5 MG tablet Take 1 tablet (5 mg total) by mouth daily.     Allergies:   Codeine   Social History   Tobacco Use  . Smoking status: Former Smoker    Types: Cigarettes  . Smokeless tobacco: Never Used  . Tobacco comment: hasnt smoked in 1 wk  Substance Use Topics  . Alcohol use: No    Frequency: Never  . Drug use: No     Family Hx: The patient's family history includes Breast cancer (age of onset: 69) in her mother; Cancer in her mother; Hyperlipidemia in her father.  ROS:   Please see the history of present illness.   All other systems reviewed and are negative.   Prior CV studies:   The following studies were reviewed today:  Limited echo (02/03/2018): Normal LV size.  LVEF 60-65% with normal wall motion.  Grade 2 diastolic dysfunction.  Normal RV size and function.  Mild mitral regurgitation.  LHC (01/06/2018): LMCA normal.  LAD with 30% proximal disease and mild luminal irregularities involving D1.  Dominant LCx with 40% ostial stenosis and 95% distal narrowing.  Nondominant RCA with 95% mid vessel stenosis.  EDP 20 to 25 mmHg.  Pharmacologic MPI (01/05/2018): Abnormal, probably low risk myocardial perfusion stress test with small in size, mild in severity, reversible defect involving the basal and mid inferolateral segments.  Labs/Other Tests and Data  Reviewed:    EKG:  No ECG reviewed.  Recent Labs: 01/05/2018: ALT 16 01/07/2018: Hemoglobin 11.8; Platelets 205 02/11/2018: BUN 16; Creatinine, Ser 0.85; Potassium 4.5; Sodium 140   Recent Lipid Panel Lab Results  Component Value Date/Time   CHOL 164 01/06/2018 12:13 AM   CHOL 191 06/03/2017 09:16 AM   TRIG 182 (H) 01/06/2018 12:13 AM   HDL 33 (L) 01/06/2018 12:13 AM   HDL 48 06/03/2017 09:16 AM   CHOLHDL 5.0 01/06/2018 12:13 AM   LDLCALC 95 01/06/2018 12:13 AM   LDLCALC 118 (H) 06/03/2017 09:16 AM    Wt Readings from Last 3 Encounters:  09/22/18 194 lb (88 kg)  07/01/18 194 lb (88 kg)  05/12/18 191 lb (86.6 kg)     Objective:    Vital Signs:  Ht 5' 1.5" (1.562 m)   Wt 194 lb (  88 kg)   BMI 36.06 kg/m    VITAL SIGNS:  reviewed GEN:  no acute distress  ASSESSMENT & PLAN:    Coronary artery disease with stable angina: Chest pain has improved with escalation of ranolazine.  We will defer catheterization and continue her current antianginal regimen consisting of ranolazine, isosorbide mononitrate, and bisoprolol.  DAPT with aspirin and clopidogrel will need to be continue through at least 12/2018.  Hypertension: BP upper normal to mildly elevated (goal < 130/80).  Ms. Cook should continue to monitor her BP and let us know if it is consistent above 130/80.  No medication changes today.  Hyperlipidemia: Continue atorvastatin 80 mg daily.  She will need a repeat lipid panel before our follow-up visit to ensure that LDL is at goal (goal < 70).  COVID-19 Education: The signs and symptoms of COVID-19 were discussed with the patient and how to seek care for testing (follow up with PCP or arrange E-visit).  The importance of social distancing was discussed today.  Time:   Today, I have spent 10 minutes with the patient with telehealth technology discussing the above problems.     Medication Adjustments/Labs and Tests Ordered: Current medicines are reviewed at length with  the patient today.  Concerns regarding medicines are outlined above.   Tests Ordered: None.  Medication Changes: None.  Disposition:  Follow up in 6 month(s)  Signed, Yvonne Kendall, MD  09/22/2018 5:18 PM    [facility]

## 2018-09-22 ENCOUNTER — Telehealth (INDEPENDENT_AMBULATORY_CARE_PROVIDER_SITE_OTHER): Payer: BLUE CROSS/BLUE SHIELD | Admitting: Internal Medicine

## 2018-09-22 ENCOUNTER — Other Ambulatory Visit: Payer: Self-pay

## 2018-09-22 ENCOUNTER — Encounter: Payer: Self-pay | Admitting: Internal Medicine

## 2018-09-22 VITALS — Ht 61.5 in | Wt 194.0 lb

## 2018-09-22 DIAGNOSIS — I25118 Atherosclerotic heart disease of native coronary artery with other forms of angina pectoris: Secondary | ICD-10-CM

## 2018-09-22 DIAGNOSIS — I10 Essential (primary) hypertension: Secondary | ICD-10-CM

## 2018-09-22 DIAGNOSIS — E785 Hyperlipidemia, unspecified: Secondary | ICD-10-CM

## 2018-09-22 MED ORDER — OLMESARTAN MEDOXOMIL 5 MG PO TABS: 5.0000 mg | ORAL_TABLET | Freq: Every day | ORAL | 3 refills | Status: DC

## 2018-09-22 NOTE — Patient Instructions (Signed)
Medication Instructions:  Your physician recommends that you continue on your current medications as directed. Please refer to the Current Medication list given to you today.  If you need a refill on your cardiac medications before your next appointment, please call your pharmacy.   Lab work: none If you have labs (blood work) drawn today and your tests are completely normal, you will receive your results only by: . MyChart Message (if you have MyChart) OR . A paper copy in the mail If you have any lab test that is abnormal or we need to change your treatment, we will call you to review the results.  Testing/Procedures: none  Follow-Up: At [facility], you and your health needs are our priority.  As part of our continuing mission to provide you with exceptional heart care, we have created designated Provider Care Teams.  These Care Teams include your primary Cardiologist (physician) and Advanced Practice Providers (APPs -  Physician Assistants and Nurse Practitioners) who all work together to provide you with the care you need, when you need it. You will need a follow up appointment in 6 months.  Please call our office 2 months in advance to schedule this appointment.  You may see Christopher End, MD or one of the following Advanced Practice Providers on your designated Care Team:   Christopher Berge, NP Ryan Dunn, PA-C . Jacquelyn Visser, PA-C     

## 2018-09-30 ENCOUNTER — Ambulatory Visit: Payer: BLUE CROSS/BLUE SHIELD | Admitting: Internal Medicine

## 2018-10-24 ENCOUNTER — Other Ambulatory Visit: Payer: Self-pay

## 2018-10-24 ENCOUNTER — Telehealth: Payer: Self-pay

## 2018-10-24 NOTE — Telephone Encounter (Signed)
Spoke with pt she called that metformin she supposed to be take 1/2 tab and she was taking metformin 500 1/2 tab twice a daily as per dr Humphrey Rolls pt advised stopped metformin for now and monitor your glucose and we can recheck hba1c next month

## 2018-11-01 ENCOUNTER — Other Ambulatory Visit: Payer: Self-pay

## 2018-11-01 MED ORDER — VENTOLIN HFA 108 (90 BASE) MCG/ACT IN AERS: 1.0000 | INHALATION_SPRAY | Freq: Every day | RESPIRATORY_TRACT | 2 refills | Status: DC

## 2018-11-17 ENCOUNTER — Encounter: Payer: Self-pay | Admitting: Nurse Practitioner

## 2018-11-17 ENCOUNTER — Ambulatory Visit: Payer: BLUE CROSS/BLUE SHIELD | Admitting: Nurse Practitioner

## 2018-11-17 ENCOUNTER — Other Ambulatory Visit: Payer: Self-pay

## 2018-11-17 VITALS — BP 140/80 | HR 66 | Temp 98.3°F | Resp 16 | Ht 62.0 in | Wt 203.8 lb

## 2018-11-17 DIAGNOSIS — E1165 Type 2 diabetes mellitus with hyperglycemia: Secondary | ICD-10-CM | POA: Diagnosis not present

## 2018-11-17 DIAGNOSIS — I10 Essential (primary) hypertension: Secondary | ICD-10-CM

## 2018-11-17 DIAGNOSIS — Z1239 Encounter for other screening for malignant neoplasm of breast: Secondary | ICD-10-CM

## 2018-11-17 DIAGNOSIS — I25118 Atherosclerotic heart disease of native coronary artery with other forms of angina pectoris: Secondary | ICD-10-CM

## 2018-11-17 DIAGNOSIS — M19012 Primary osteoarthritis, left shoulder: Secondary | ICD-10-CM | POA: Diagnosis not present

## 2018-11-17 LAB — POCT GLYCOSYLATED HEMOGLOBIN (HGB A1C): Hemoglobin A1C: 5.8 % — AB (ref 4.0–5.6)

## 2018-11-17 MED ORDER — DICLOFENAC SODIUM 1 % TD GEL: 4.0000 g | Freq: Four times a day (QID) | TRANSDERMAL | 3 refills | Status: DC

## 2018-11-17 NOTE — Progress Notes (Signed)
Pt blood pressure elevated, informed provider.  Pt stated that she takes it at home and it usually runs in normal range. Last night it was 127/60. Pt noticed blood pressure is elevated when active, and has noticed a lot more anxiety lately.

## 2018-11-17 NOTE — Patient Instructions (Signed)
Acromioclavicular Separation Rehab Ask your health care provider which exercises are safe for you. Do exercises exactly as told by your health care provider and adjust them as directed. It is normal to feel mild stretching, pulling, tightness, or discomfort as you do these exercises. Stop right away if you feel sudden pain or your pain gets worse. Do not begin these exercises until told by your health care provider. Stretching and range-of-motion exercises These exercises warm up your muscles and joints and improve the movement and flexibility of your shoulder. The exercises also help to relieve pain and stiffness. Pendulum This is a shoulder exercise in which you let the injured arm dangle toward the floor and then swing it like a clock pendulum. 1. Stand near a wall or a surface that you can hold onto for balance. 2. Bend forward at the waist and let your left / right arm hang straight down. Use your other arm to keep your balance. 3. Relax your arm and shoulder muscles, and move your hips and your trunk so your left / right arm swings freely. Your arm should swing because of the motion of your body, not because you are using your arm or shoulder muscles. 4. Keep moving your hips and trunk so your arm swings in the following directions, as told by your health care provider: ? Side to side. ? Forward and backward. ? In clockwise and counterclockwise circles. 5. Slowly return to the starting position. Repeat __________ times. Complete this exercise __________ times a day. Shoulder flexion, seated This is a form of shoulder exercise in which you raise an arm in front of your body until you feel a stretch in your injured shoulder. 1. Sit in a stable chair and rest your left / right forearm on a flat surface. Your elbow should rest at a height that keeps your upper arm next to your body. 2. Keeping your left / right shoulder relaxed, lean forward at the waist and slide your hand forward until you feel  a stretch in your shoulder (flexion). ? You can move your chair farther away from the surface to increase the stretch by bending forward more, if needed. 3. Hold for __________ seconds. 4. Slowly return to the starting position. Repeat __________ times. Complete this exercise __________ times a day. Strengthening exercises These exercises build strength and endurance in your shoulder. Endurance is the ability to use your muscles for a long time, even after they get tired. Scapular retraction In this exercise, the shoulder blades (scapulae) are pulled toward each other and toward the spine. 1. Sit in a stable chair without armrests, or stand up. 2. Secure an exercise band to a stable object in front of you so the band is at shoulder height. 3. Hold one end of the exercise band in each hand. 4. Squeeze your shoulder blades together (scapular retraction) and move your elbows slightly behind you. Do not shrug your shoulders upward while you do this. 5. Hold for __________ seconds. 6. Slowly return to the starting position. Repeat __________ times. Complete this exercise __________ times a day. Shoulder abduction In this exercise, you raise your arm and shoulder so that they move away from the center of the body (abduction), toward the outside. 1. Sit in a stable chair without armrests, or stand up. 2. If directed, hold a __________ lb weight in your left / right hand. 3. Start with your arms straight down. Turn your left / right hand so your palm faces in, toward your  body. 4. Slowly lift your left / right hand out to your side. Do not lift your hand above shoulder height. ? Keep your arms straight. ? Avoid shrugging your shoulder upward while you do this movement. Keep your shoulder blade tucked down toward the middle of your back. 5. Hold for __________ seconds. 6. Slowly lower your arm and return to the starting position. Repeat __________ times. Complete this exercise __________ times a  day. Scapular protraction, standing In this exercise, you move the shoulder blades away from each other, and away from the spine (protraction). This is the opposite of retraction. 1. Stand so you are facing a wall. Place your feet about one arm-length away from the wall. 2. Place your hands on the wall in front of you and straighten your elbows. 3. Keep your hands on the wall as you push your upper back away from the wall. You should feel your shoulder blades sliding forward around your rib cage. Keep your elbows and your head still. 4. Hold for __________ seconds. 5. Slowly return to the starting position. Let your muscles relax completely before you repeat this exercise. Repeat __________ times. Complete this exercise __________ times a day. This information is not intended to replace advice given to you by your health care provider. Make sure you discuss any questions you have with your health care provider. Document Released: 05/04/2005 Document Revised: 03/03/2018 Document Reviewed: 03/03/2018 Elsevier Patient Education  2020 [employer].

## 2018-11-17 NOTE — Progress Notes (Signed)
[facility] [address] [place], [place] [address]  Internal MEDICINE  Office Visit Note  Patient Name: Sherry Torres  [id number]  [id number]  Date of Service: 11/17/2018  Chief Complaint  Patient presents with  . Medical Management of Chronic Issues  . Diabetes    A1C  . Pain    muscle pain , muscle relaxers that she have she doesnt like it makes her feel spaced out the next morning    The patient is here for routine follow up visit. Blood sugars are doing well. HgbA1c is 5.8 today. She continues to have left shoulder pain, which will radiate into the neck. This will be worse after exertion. She is very active and does a lot of lifting at work. She did have x-ray last year. This showed degenerative changes of left AC joint.        Current Medication: Outpatient Encounter Medications as of 11/17/2018  Medication Sig  . aspirin 81 MG chewable tablet Chew 1 tablet (81 mg total) by mouth daily.  Marland Kitchen. atorvastatin (LIPITOR) 80 MG tablet Take 1 tablet (80 mg total) by mouth daily at 6 PM.  . bisoprolol (ZEBETA) 5 MG tablet Take 1 tablet (5 mg total) by mouth daily.  Marland Kitchen. buPROPion (ZYBAN) 150 MG 12 hr tablet Take 1 tablet (150 mg total) by mouth 2 (two) times daily.  . clopidogrel (PLAVIX) 75 MG tablet Take 1 tablet (75 mg total) by mouth daily with breakfast.  . cyclobenzaprine (FLEXERIL) 5 MG tablet Take 1 tablet (5 mg total) by mouth 3 (three) times daily as needed for muscle spasms.  . isosorbide mononitrate (IMDUR) 60 MG 24 hr tablet Take 1.5 tablets (90 mg total) by mouth 2 (two) times daily.  . metFORMIN (GLUCOPHAGE) 500 MG tablet Take 0.5 tablets (250 mg total) by mouth daily with breakfast.  . nitroGLYCERIN (NITROSTAT) 0.4 MG SL tablet Place 1 tablet (0.4 mg total) under the tongue every 5 (five) minutes as needed for chest pain.  Marland Kitchen. olmesartan (BENICAR) 5 MG tablet Take 1 tablet (5 mg total) by mouth daily.  . ranolazine (RANEXA) 1000 MG SR tablet Take 1 tablet  (1,000 mg total) by mouth 2 (two) times daily.  . VENTOLIN HFA 108 (90 Base) MCG/ACT inhaler Inhale 1-2 puffs into the lungs daily.  . diclofenac sodium (VOLTAREN) 1 % GEL Apply 4 g topically 4 (four) times daily.  . hydrochlorothiazide (HYDRODIURIL) 25 MG tablet Take 1 tablet (25 mg total) by mouth daily.   No facility-administered encounter medications on file as of 11/17/2018.     Surgical History: Past Surgical History:  Procedure Laterality Date  . LEFT HEART CATH AND CORONARY ANGIOGRAPHY N/A 01/06/2018   Procedure: LEFT HEART CATH AND CORONARY ANGIOGRAPHY;  Surgeon: Iran OuchArida, Muhammad A, MD;  Location: [facility] INVASIVE CV LAB;  Service: Cardiovascular;  Laterality: N/A;    Medical History: Past Medical History:  Diagnosis Date  . Anxiety   . Asthma   . CAD (coronary artery disease)    a. 12/2017 NSTEMI;  b. 12/2017 MV: basal and mid inflat ischemia; c. 12/2017 Cath: LM nl, LAD 30ost, D1 mild dzs, LCX 40ost, 95d, RCA 7880m (nondominant). LCX felt to be culprit but only 1.675mm vessel-->Med rx.  . Diastolic dysfunction    a. 12/2017 Echo: EF 55-60%, no rwma, Gr1 DD; b. 01/2018 Echo: EF 60-65%, gr2 DD, no rwma, mild MR, nl RV fxn.  . Hyperlipidemia LDL goal <70   . Hypertension   . Recurrent major  depressive disorder, in partial remission (HCC) 09/17/2017  . Tobacco abuse   . Type 2 diabetes mellitus (HCC)     Family History: Family History  Problem Relation Age of Onset  . Cancer Mother   . Breast cancer Mother [id number]  . Hyperlipidemia Father     Social History   Socioeconomic History  . Marital status: Married    Spouse name: Not on file  . Number of children: Not on file  . Years of education: Not on file  . Highest education level: Not on file  Occupational History  . Not on file  Social Needs  . Financial resource strain: Not on file  . Food insecurity    Worry: Not on file    Inability: Not on file  . Transportation needs    Medical: Not on file    Non-medical: Not on file   Tobacco Use  . Smoking status: Former Smoker    Types: Cigarettes  . Smokeless tobacco: Never Used  . Tobacco comment: hasnt smoked in 1 wk  Substance and Sexual Activity  . Alcohol use: No    Frequency: Never  . Drug use: No  . Sexual activity: Not on file  Lifestyle  . Physical activity    Days per week: Not on file    Minutes per session: Not on file  . Stress: Not on file  Relationships  . Social Musicianconnections    Talks on phone: Not on file    Gets together: Not on file    Attends religious service: Not on file    Active member of club or organization: Not on file    Attends meetings of clubs or organizations: Not on file    Relationship status: Not on file  . Intimate partner violence    Fear of current or ex partner: Not on file    Emotionally abused: Not on file    Physically abused: Not on file    Forced sexual activity: Not on file  Other Topics Concern  . Not on file  Social History Narrative  . Not on file      Review of Systems  Constitutional: Negative for activity change, appetite change, fatigue, fever and unexpected weight change.  HENT: Negative for congestion, postnasal drip, rhinorrhea, sinus pressure and voice change.   Respiratory: Negative for cough, chest tightness, shortness of breath and wheezing.   Cardiovascular: Negative for chest pain and palpitations.       Blood pressure well managed.   Gastrointestinal: Negative for abdominal pain, constipation, diarrhea, nausea and vomiting.  Endocrine: Negative for cold intolerance, heat intolerance, polydipsia and polyuria.       Blood sugars are doing well.   Musculoskeletal: Positive for arthralgias. Negative for neck pain.       Left shoulder pain, especially with exertion. Hurts to lift the arm and reach behind her. Has some tightness in neck muscle, reaching into the right shoulder as well.   Skin: Negative for rash.  Allergic/Immunologic: Negative for environmental allergies.  Neurological:  Negative for dizziness, weakness, light-headedness and headaches.  Hematological: Negative for adenopathy. Does not bruise/bleed easily.  Psychiatric/Behavioral: Positive for sleep disturbance. Negative for agitation. The patient is nervous/anxious.        Increased anxiety due to COVID 19 pandemic.    Today's Vitals   11/17/18 0937  BP: 140/80  Pulse: 66  Resp: 16  Temp: 98.3 F (36.8 C)  SpO2: 97%  Weight: 203 lb 12.8 oz (92.4 kg)  Height: 5\' 2"  (1.575 m)   Body mass index is 37.28 kg/m.  Physical Exam Vitals signs and nursing note reviewed.  Constitutional:      General: She is not in acute distress.    Appearance: Normal appearance. She is well-developed. She is not diaphoretic.  HENT:     Head: Normocephalic and atraumatic.     Mouth/Throat:     Pharynx: No oropharyngeal exudate.  Eyes:     Pupils: Pupils are equal, round, and reactive to light.  Neck:     Musculoskeletal: Normal range of motion and neck supple.     Thyroid: No thyromegaly.     Vascular: No carotid bruit or JVD.     Trachea: No tracheal deviation.  Cardiovascular:     Rate and Rhythm: Normal rate and regular rhythm.     Heart sounds: Normal heart sounds. No murmur. No friction rub. No gallop.   Pulmonary:     Effort: Pulmonary effort is normal. No respiratory distress.     Breath sounds: Normal breath sounds. No wheezing or rales.  Chest:     Chest wall: No tenderness.  Abdominal:     General: Bowel sounds are normal.     Palpations: Abdomen is soft.     Tenderness: There is no abdominal tenderness.  Musculoskeletal: Normal range of motion.     Comments: Left shoulder tenderness.ROM and strength is diminished due to pain. No bony abnormalities or deformities can be felt. Does have tight muscles in left ppsterior neck.   Lymphadenopathy:     Cervical: No cervical adenopathy.  Skin:    General: Skin is warm and dry.  Neurological:     Mental Status: She is alert and oriented to person, place,  and time.     Cranial Nerves: No cranial nerve deficit.  Psychiatric:        Behavior: Behavior normal.        Thought Content: Thought content normal.        Judgment: Judgment normal.   Assessment/Plan: 1. Uncontrolled type 2 diabetes mellitus with hyperglycemia (HCC) - POCT HgB A1C 5.8 today. Continue diabetic medication as prescribed   2. Essential hypertension Stable. Continue BP medication as prescribed   3. Primary osteoarthritis of left shoulder Diclofenac gel may be applied to shoulder up to four times daily as needed for pain. Rehab exercise information attached to AVS. Consider referral to orthopedics as indicated  - diclofenac sodium (VOLTAREN) 1 % GEL; Apply 4 g topically 4 (four) times daily.  Dispense: 100 g; Refill: 3  4. Coronary artery disease of native artery of native heart with stable angina pectoris [facility]) Regular visits to cardiology as scheduled.   5. Screening for breast cancer - MM DIGITAL SCREENING BILATERAL; Future  General Counseling: Brittie verbalizes understanding of the findings of todays visit and agrees with plan of treatment. I have discussed any further diagnostic evaluation that may e needed or ordered today. We also reviewed her medications today. she has been encouraged to call the office with any questions or concerns that should arise related to todays visit.  Diabetes Counseling:  1. Addition of ACE inh/ ARB'S for nephroprotection. Microalbumin is updated  2. Diabetic foot care, prevention of complications. Podiatry consult 3. Exercise and lose weight.  4. Diabetic eye examination, Diabetic eye exam is updated  5. Monitor blood sugar closlely. nutrition counseling.  6. Sign and symptoms of hypoglycemia including shaking sweating,confusion and headaches.  This patient was seen by Leretha Pol FNP Collaboration  with Dr Lyndon CodeFozia M Khan as a part of collaborative care agreement   Orders Placed This Encounter  Procedures  . MM DIGITAL  SCREENING BILATERAL  . POCT HgB A1C    Meds ordered this encounter  Medications  . diclofenac sodium (VOLTAREN) 1 % GEL    Sig: Apply 4 g topically 4 (four) times daily.    Dispense:  100 g    Refill:  3    Order Specific Question:   Supervising Provider    Answer:   Lyndon CodeKHAN, FOZIA M [[id number]]    Time spent: 5725 Minutes      Dr Lyndon CodeFozia M Khan Internal medicine

## 2019-02-17 ENCOUNTER — Other Ambulatory Visit: Payer: Self-pay | Admitting: Nurse Practitioner

## 2019-03-09 ENCOUNTER — Other Ambulatory Visit: Payer: Self-pay

## 2019-03-09 DIAGNOSIS — E119 Type 2 diabetes mellitus without complications: Secondary | ICD-10-CM

## 2019-03-09 MED ORDER — METFORMIN HCL 500 MG PO TABS: 250.0000 mg | ORAL_TABLET | Freq: Every day | ORAL | 3 refills | Status: DC

## 2019-03-22 ENCOUNTER — Other Ambulatory Visit: Payer: Self-pay

## 2019-03-22 ENCOUNTER — Ambulatory Visit (INDEPENDENT_AMBULATORY_CARE_PROVIDER_SITE_OTHER): Payer: BLUE CROSS/BLUE SHIELD | Admitting: Internal Medicine

## 2019-03-22 ENCOUNTER — Encounter: Payer: Self-pay | Admitting: Internal Medicine

## 2019-03-22 VITALS — BP 132/60 | HR 63 | Temp 97.1°F | Ht 62.0 in | Wt 209.5 lb

## 2019-03-22 DIAGNOSIS — I25118 Atherosclerotic heart disease of native coronary artery with other forms of angina pectoris: Secondary | ICD-10-CM | POA: Diagnosis not present

## 2019-03-22 DIAGNOSIS — E1159 Type 2 diabetes mellitus with other circulatory complications: Secondary | ICD-10-CM

## 2019-03-22 DIAGNOSIS — I10 Essential (primary) hypertension: Secondary | ICD-10-CM | POA: Diagnosis not present

## 2019-03-22 DIAGNOSIS — E119 Type 2 diabetes mellitus without complications: Secondary | ICD-10-CM | POA: Insufficient documentation

## 2019-03-22 DIAGNOSIS — E785 Hyperlipidemia, unspecified: Secondary | ICD-10-CM

## 2019-03-22 DIAGNOSIS — R600 Localized edema: Secondary | ICD-10-CM

## 2019-03-22 MED ORDER — FUROSEMIDE 20 MG PO TABS: 20.0000 mg | ORAL_TABLET | Freq: Every day | ORAL | 5 refills | Status: DC | PRN

## 2019-03-22 MED ORDER — RANOLAZINE ER 1000 MG PO TB12: 1000.0000 mg | ORAL_TABLET | Freq: Every day | ORAL | 2 refills | Status: DC

## 2019-03-22 NOTE — Patient Instructions (Signed)
Medication Instructions:  Your physician has recommended you make the following change in your medication:  1- ok to CHANGE Ranexa 1 tablet by mouth once a day. 2- TAKE Furosemide 20 mg (1 tablet) by mouth daily AS NEEDED for edema (swelling) or weight gain.  *If you need a refill on your cardiac medications before your next appointment, please call your pharmacy*  Lab Work: Your physician recommends that you return for lab work in: TODAY - CMET, CBC, LIPID.  If you have labs (blood work) drawn today and your tests are completely normal, you will receive your results only by: Marland Kitchen MyChart Message (if you have MyChart) OR . A paper copy in the mail If you have any lab test that is abnormal or we need to change your treatment, we will call you to review the results.  Testing/Procedures: NONE  Follow-Up: At [facility], you and your health needs are our priority.  As part of our continuing mission to provide you with exceptional heart care, we have created designated Provider Care Teams.  These Care Teams include your primary Cardiologist (physician) and Advanced Practice Providers (APPs -  Physician Assistants and Nurse Practitioners) who all work together to provide you with the care you need, when you need it.  Your next appointment:   6 months  The format for your next appointment:   In Person  Provider:    You may see Nelva Bush, MD or one of the following Advanced Practice Providers on your designated Care Team:    Murray Hodgkins, NP  Christell Faith, PA-C  Marrianne Mood, PA-C

## 2019-03-22 NOTE — Progress Notes (Signed)
Follow-up Outpatient Visit Date: 03/22/2019  Primary Care Provider: Ronnell Freshwater, NP [address] [place] [address]  Chief Complaint: Leg swelling  HPI:  Sherry Torres is a 58 y.o. year-old female with history of coronaryartery disease with high-grade disease involving distal LCx and nondominant RCA, hypertension, hyperlipidemia, diabetes mellitus, and anxiety, who presents for follow-up of stable angina.  We last spoke in May, at which time Sherry Torres reported that her chest pain had almost completely resolved with escalation of ranolazine.  Today, Sherry Torres reports that her chest pain has been well controlled.  She denies any significant angina nor shortness of breath, palpitations, lightheadedness, or orthopnea.  She began noticing some swelling in her legs 4 to 6 weeks ago and wonders if this could be due to ranolazine.  She has cut down on ranolazine to 1000 mg daily and feels like the leg swelling is a little bit better.  She notes that it is worse when she has been standing for long period of time.  She admits that she eats quite a bit of salt.  She is compliant with her other medications.  She has not had any significant bleeding, remaining on dual antiplatelet therapy with aspirin and clopidogrel.  --------------------------------------------------------------------------------------------------  Past Medical History:  Diagnosis Date  . Anxiety   . Asthma   . CAD (coronary artery disease)    a. 12/2017 NSTEMI;  b. 12/2017 MV: basal and mid inflat ischemia; c. 12/2017 Cath: LM nl, LAD 30ost, D1 mild dzs, LCX 40ost, 95d, RCA 73m (nondominant). LCX felt to be culprit but only 1.51mm vessel-->Med rx.  . Diastolic dysfunction    a. 12/2017 Echo: EF 55-60%, no rwma, Gr1 DD; b. 01/2018 Echo: EF 60-65%, gr2 DD, no rwma, mild MR, nl RV fxn.  . Hyperlipidemia LDL goal <70   . Hypertension   . Recurrent major depressive disorder, in partial remission ([facility]) 09/17/2017  . Tobacco  abuse   . Type 2 diabetes mellitus ([facility])    Past Surgical History:  Procedure Laterality Date  . LEFT HEART CATH AND CORONARY ANGIOGRAPHY N/A 01/06/2018   Procedure: LEFT HEART CATH AND CORONARY ANGIOGRAPHY;  Surgeon: Wellington Hampshire, MD;  Location: [facility];  Service: Cardiovascular;  Laterality: N/A;    Current Meds  Medication Sig  . aspirin 81 MG chewable tablet Chew 1 tablet (81 mg total) by mouth daily.  Marland Kitchen atorvastatin (LIPITOR) 80 MG tablet Take 1 tablet (80 mg total) by mouth daily at 6 PM.  . bisoprolol (ZEBETA) 5 MG tablet Take 1 tablet (5 mg total) by mouth daily.  Marland Kitchen buPROPion (ZYBAN) 150 MG 12 hr tablet Take 1 tablet (150 mg total) by mouth 2 (two) times daily.  . clopidogrel (PLAVIX) 75 MG tablet Take 1 tablet (75 mg total) by mouth daily with breakfast.  . cyclobenzaprine (FLEXERIL) 5 MG tablet Take 1 tablet (5 mg total) by mouth 3 (three) times daily as needed for muscle spasms.  . diclofenac sodium (VOLTAREN) 1 % GEL Apply 4 g topically 4 (four) times daily.  . hydrochlorothiazide (HYDRODIURIL) 25 MG tablet Take 1 tablet (25 mg total) by mouth daily.  . isosorbide mononitrate (IMDUR) 60 MG 24 hr tablet Take 1.5 tablets (90 mg total) by mouth 2 (two) times daily.  . nitroGLYCERIN (NITROSTAT) 0.4 MG SL tablet Place 1 tablet (0.4 mg total) under the tongue every 5 (five) minutes as needed for chest pain.  Marland Kitchen olmesartan (BENICAR) 5 MG tablet Take 1 tablet (  5 mg total) by mouth daily.  . ranolazine (RANEXA) 1000 MG SR tablet Take 1 tablet (1,000 mg total) by mouth 2 (two) times daily.  . VENTOLIN HFA 108 (90 Base) MCG/ACT inhaler Inhale 1-2 puffs into the lungs daily.  . [DISCONTINUED] metFORMIN (GLUCOPHAGE) 500 MG tablet Take 0.5 tablets (250 mg total) by mouth daily with breakfast.    Allergies: Codeine  Social History   Tobacco Use  . Smoking status: Former Smoker    Types: Cigarettes  . Smokeless tobacco: Never Used  . Tobacco comment: hasnt smoked in 1  wk  Substance Use Topics  . Alcohol use: No    Frequency: Never  . Drug use: No    Family History  Problem Relation Age of Onset  . Cancer Mother   . Breast cancer Mother 73  . Hyperlipidemia Father     Review of Systems: A 12-system review of systems was performed and was negative except as noted in the HPI.  --------------------------------------------------------------------------------------------------  Physical Exam: BP 132/60 (BP Location: Left Arm, Patient Position: Sitting, Cuff Size: Normal)   Pulse 63   Temp (!) 97.1 F (36.2 C)   Ht 5\' 2"  (1.575 m)   Wt 209 lb 8 oz (95 kg)   BMI 38.32 kg/m   General: NAD. HEENT: No conjunctival pallor or scleral icterus.  Facemask in place. Neck: Supple without lymphadenopathy, thyromegaly, JVD, or HJR. Lungs: Normal work of breathing. Clear to auscultation bilaterally without wheezes or crackles. Heart: Regular rate and rhythm without murmurs, rubs, or gallops. Non-displaced PMI. Abd: Bowel sounds present. Soft, NT/ND without hepatosplenomegaly Ext: Trace pretibial edema bilaterally.  2+ radial pulses. Skin: Warm and dry without rash.  EKG: Normal sinus rhythm without significant abnormality.  Lab Results  Component Value Date   WBC 8.7 01/07/2018   HGB 11.8 (L) 01/07/2018   HCT 34.0 (L) 01/07/2018   MCV 88.5 01/07/2018   PLT 205 01/07/2018    Lab Results  Component Value Date   NA 140 02/11/2018   K 4.5 02/11/2018   CL 101 02/11/2018   CO2 24 02/11/2018   BUN 16 02/11/2018   CREATININE 0.85 02/11/2018   GLUCOSE 252 (H) 02/11/2018   ALT 16 01/05/2018    Lab Results  Component Value Date   CHOL 164 01/06/2018   HDL 33 (L) 01/06/2018   LDLCALC 95 01/06/2018   TRIG 182 (H) 01/06/2018   CHOLHDL 5.0 01/06/2018    --------------------------------------------------------------------------------------------------  ASSESSMENT AND PLAN: Coronary artery disease with stable angina: Sherry Torres has not had  any significant chest pain since we last spoke despite decreasing ranolazine to 1000 mg twice daily.  We discussed that this medicine is normally dosed twice a day, but due to leg edema that can rarely occur with this medication, Ms. Toso would like to continue with current dosing.  We will therefore continue current regimen of ranolazine, isosorbide mononitrate, and bisoprolol.  We discussed risks and benefits of long-term dual antiplatelet therapy with aspirin and clopidogrel and have agreed to continue this going forward.  I will check a CBC today.  Hypertension: Blood pressure mildly elevated today (goal less than 130/90).  I stressed the importance of sodium restriction.  No medication changes today.  Hyperlipidemia: Ms. Delpriore is tolerating high intensity statin therapy well.  We will check a fasting lipid panel and complete metabolic panel today.  Type 2 diabetes mellitus: Well-controlled leading to recent discontinuation of Metformin.  Continue lifestyle modifications and follow-up with PCP.  Lower extremity  edema: There is likely multifactorial, including salt intake, venous insufficiency, and potentially some degree of HFpEF.  Only trace edema is evident today.  We discussed the importance of sodium restriction.  I have provided Ms. Shetterly with a prescription for furosemide 20 mg daily as needed for leg edema/weight gain.  As above, we will check a CMP today.  Morbid obesity: BMI greater than 35 with multiple comorbidities (CAD, hypertension, and diabetes mellitus).  Weight loss encouraged through diet and exercise.  Follow-up: Return to clinic in 6 months.  Yvonne Kendallhristopher Muneer Leider, MD 03/22/2019 8:21 AM

## 2019-03-23 LAB — COMPREHENSIVE METABOLIC PANEL
ALT: 14 IU/L (ref 0–32)
AST: 12 IU/L (ref 0–40)
Albumin/Globulin Ratio: 2 (ref 1.2–2.2)
Albumin: 4.3 g/dL (ref 3.8–4.9)
Alkaline Phosphatase: 63 IU/L (ref 39–117)
BUN/Creatinine Ratio: 15 (ref 9–23)
BUN: 13 mg/dL (ref 6–24)
Bilirubin Total: 0.6 mg/dL (ref 0.0–1.2)
CO2: 25 mmol/L (ref 20–29)
Calcium: 9 mg/dL (ref 8.7–10.2)
Chloride: 102 mmol/L (ref 96–106)
Creatinine, Ser: 0.88 mg/dL (ref 0.57–1.00)
GFR calc Af Amer: 84 mL/min/{1.73_m2} (ref >59)
GFR calc non Af Amer: 73 mL/min/{1.73_m2} (ref >59)
Globulin, Total: 2.2 g/dL (ref 1.5–4.5)
Glucose: 182 mg/dL — ABNORMAL HIGH (ref 65–99)
Potassium: 4.2 mmol/L (ref 3.5–5.2)
Sodium: 139 mmol/L (ref 134–144)
Total Protein: 6.5 g/dL (ref 6.0–8.5)

## 2019-03-23 LAB — CBC WITH DIFFERENTIAL/PLATELET
Basophils Absolute: 0.1 10*3/uL (ref 0.0–0.2)
Basos: 1 %
EOS (ABSOLUTE): 0.1 10*3/uL (ref 0.0–0.4)
Eos: 2 %
Hematocrit: 35.1 % (ref 34.0–46.6)
Hemoglobin: 11.7 g/dL (ref 11.1–15.9)
Immature Grans (Abs): 0 10*3/uL (ref 0.0–0.1)
Immature Granulocytes: 1 %
Lymphocytes Absolute: 1.7 10*3/uL (ref 0.7–3.1)
Lymphs: 27 %
MCH: 30.9 pg (ref 26.6–33.0)
MCHC: 33.3 g/dL (ref 31.5–35.7)
MCV: 93 fL (ref 79–97)
Monocytes Absolute: 0.5 10*3/uL (ref 0.1–0.9)
Monocytes: 8 %
Neutrophils Absolute: 3.9 10*3/uL (ref 1.4–7.0)
Neutrophils: 61 %
Platelets: 228 10*3/uL (ref 150–450)
RBC: 3.79 x10E6/uL (ref 3.77–5.28)
RDW: 12.9 % (ref 11.7–15.4)
WBC: 6.4 10*3/uL (ref 3.4–10.8)

## 2019-03-23 LAB — LIPID PANEL
Chol/HDL Ratio: 2.4 ratio (ref 0.0–4.4)
Cholesterol, Total: 128 mg/dL (ref 100–199)
HDL: 54 mg/dL (ref >39)
LDL Chol Calc (NIH): 54 mg/dL (ref 0–99)
Triglycerides: 108 mg/dL (ref 0–149)
VLDL Cholesterol Cal: 20 mg/dL (ref 5–40)

## 2019-04-25 ENCOUNTER — Telehealth: Payer: Self-pay

## 2019-04-25 NOTE — Telephone Encounter (Signed)
CONFIRMED AND SCREENED FOR 04-27-19 OV. °

## 2019-04-27 ENCOUNTER — Encounter (INDEPENDENT_AMBULATORY_CARE_PROVIDER_SITE_OTHER): Payer: Self-pay

## 2019-04-27 ENCOUNTER — Encounter: Payer: Self-pay | Admitting: Nurse Practitioner

## 2019-04-27 ENCOUNTER — Ambulatory Visit (INDEPENDENT_AMBULATORY_CARE_PROVIDER_SITE_OTHER): Payer: BLUE CROSS/BLUE SHIELD | Admitting: Nurse Practitioner

## 2019-04-27 ENCOUNTER — Other Ambulatory Visit: Payer: Self-pay

## 2019-04-27 VITALS — BP 156/73 | HR 67 | Resp 16 | Ht 62.0 in | Wt 208.0 lb

## 2019-04-27 DIAGNOSIS — R300 Dysuria: Secondary | ICD-10-CM

## 2019-04-27 DIAGNOSIS — I25118 Atherosclerotic heart disease of native coronary artery with other forms of angina pectoris: Secondary | ICD-10-CM | POA: Diagnosis not present

## 2019-04-27 DIAGNOSIS — Z0001 Encounter for general adult medical examination with abnormal findings: Secondary | ICD-10-CM

## 2019-04-27 DIAGNOSIS — Z124 Encounter for screening for malignant neoplasm of cervix: Secondary | ICD-10-CM

## 2019-04-27 DIAGNOSIS — J4520 Mild intermittent asthma, uncomplicated: Secondary | ICD-10-CM

## 2019-04-27 DIAGNOSIS — E1165 Type 2 diabetes mellitus with hyperglycemia: Secondary | ICD-10-CM | POA: Diagnosis not present

## 2019-04-27 LAB — POCT GLYCOSYLATED HEMOGLOBIN (HGB A1C): Hemoglobin A1C: 6.1 % — AB (ref 4.0–5.6)

## 2019-04-27 MED ORDER — VENTOLIN HFA 108 (90 BASE) MCG/ACT IN AERS: 1.0000 | INHALATION_SPRAY | Freq: Every day | RESPIRATORY_TRACT | 3 refills | Status: DC

## 2019-04-27 NOTE — Progress Notes (Signed)
[facility] [address] [place], [place] [address]   Internal MEDICINE  Office Visit Note  Patient Name: Sherry Torres  [id number]  [id number]  Date of Service: 04/28/2019   Pt is here for routine health maintenance examination  Chief Complaint  Patient presents with  . Annual Exam  . Gynecologic Exam  . Diabetes  . Hypertension  . Hyperlipidemia     The patient is here for health maintenance exam and pap smear. Her blood sugars are doing well. HgbA1c is 6.1 today. She had labs done through her cardiologist. Lipid panel was very good. Total cholesterol was 128 with LDL at 54. She states that she continues to have a menstrual cycle every few months. Last one was approximately three months ago, though not 100% sure. She is due to have diabetic eye exam.  Screening mammogram has already been ordered, she needs to have this scheduled. She does not wish to have colonoscopy at this time. She wishes to wait until fear of COVID 19 decreases.     Current Medication: Outpatient Encounter Medications as of 04/27/2019  Medication Sig  . aspirin 81 MG chewable tablet Chew 1 tablet (81 mg total) by mouth daily.  Marland Kitchen atorvastatin (LIPITOR) 80 MG tablet Take 1 tablet (80 mg total) by mouth daily at 6 PM.  . bisoprolol (ZEBETA) 5 MG tablet Take 1 tablet (5 mg total) by mouth daily.  Marland Kitchen buPROPion (ZYBAN) 150 MG 12 hr tablet Take 1 tablet (150 mg total) by mouth 2 (two) times daily.  . clopidogrel (PLAVIX) 75 MG tablet Take 1 tablet (75 mg total) by mouth daily with breakfast.  . cyclobenzaprine (FLEXERIL) 5 MG tablet Take 1 tablet (5 mg total) by mouth 3 (three) times daily as needed for muscle spasms.  . diclofenac sodium (VOLTAREN) 1 % GEL Apply 4 g topically 4 (four) times daily.  . furosemide (LASIX) 20 MG tablet Take 1 tablet (20 mg total) by mouth daily as needed for edema (For swelling and increased weight gain.).  Marland Kitchen isosorbide mononitrate (IMDUR) 60 MG 24 hr tablet Take  1.5 tablets (90 mg total) by mouth 2 (two) times daily.  . nitroGLYCERIN (NITROSTAT) 0.4 MG SL tablet Place 1 tablet (0.4 mg total) under the tongue every 5 (five) minutes as needed for chest pain.  Marland Kitchen olmesartan (BENICAR) 5 MG tablet Take 1 tablet (5 mg total) by mouth daily.  . ranolazine (RANEXA) 1000 MG SR tablet Take 1 tablet (1,000 mg total) by mouth daily.  . VENTOLIN HFA 108 (90 Base) MCG/ACT inhaler Inhale 1-2 puffs into the lungs daily.  . [DISCONTINUED] VENTOLIN HFA 108 (90 Base) MCG/ACT inhaler Inhale 1-2 puffs into the lungs daily.  . hydrochlorothiazide (HYDRODIURIL) 25 MG tablet Take 1 tablet (25 mg total) by mouth daily.   No facility-administered encounter medications on file as of 04/27/2019.    Surgical History: Past Surgical History:  Procedure Laterality Date  . LEFT HEART CATH AND CORONARY ANGIOGRAPHY N/A 01/06/2018   Procedure: LEFT HEART CATH AND CORONARY ANGIOGRAPHY;  Surgeon: Iran Ouch, MD;  Location: [facility];  Service: Cardiovascular;  Laterality: N/A;    Medical History: Past Medical History:  Diagnosis Date  . Anxiety   . Asthma   . CAD (coronary artery disease)    a. 12/2017 NSTEMI;  b. 12/2017 MV: basal and mid inflat ischemia; c. 12/2017 Cath: LM nl, LAD 30ost, D1 mild dzs, LCX 40ost, 95d, RCA 52m (nondominant). LCX felt to be culprit  but only 1.90mm vessel-->Med rx.  . Diastolic dysfunction    a. 12/2017 Echo: EF 55-60%, no rwma, Gr1 DD; b. 01/2018 Echo: EF 60-65%, gr2 DD, no rwma, mild MR, nl RV fxn.  . Hyperlipidemia LDL goal <70   . Hypertension   . Recurrent major depressive disorder, in partial remission ([facility]) 09/17/2017  . Tobacco abuse   . Type 2 diabetes mellitus ([facility])     Family History: Family History  Problem Relation Age of Onset  . Cancer Mother   . Breast cancer Mother 7  . Hyperlipidemia Father       Review of Systems  Constitutional: Negative for activity change, chills, fatigue and unexpected weight change.   HENT: Negative for congestion, postnasal drip, rhinorrhea, sneezing and sore throat.   Respiratory: Negative for cough, chest tightness, shortness of breath and wheezing.   Cardiovascular: Negative for chest pain and palpitations.  Gastrointestinal: Negative for abdominal pain, constipation, diarrhea, nausea and vomiting.  Endocrine: Negative for cold intolerance, heat intolerance, polydipsia and polyuria.       Blood sugars doing well   Genitourinary: Negative for dysuria and frequency.       The patient states that she gets a menstrual cycle every few months. Has not gone a full year without a cycle.   Musculoskeletal: Negative for arthralgias, back pain, joint swelling and neck pain.  Skin: Negative for rash.  Allergic/Immunologic: Negative for environmental allergies.  Neurological: Negative for dizziness, tremors, numbness and headaches.  Hematological: Negative for adenopathy. Does not bruise/bleed easily.  Psychiatric/Behavioral: Negative for behavioral problems (Depression), sleep disturbance and suicidal ideas. The patient is not nervous/anxious.     Today's Vitals   04/27/19 1032  BP: (!) 156/73  Pulse: 67  Resp: 16  SpO2: 95%  Weight: 208 lb (94.3 kg)  Height:  (1.575 m)   Body mass index is 38.04 kg/m.  Physical Exam Vitals and nursing note reviewed.  Constitutional:      General: She is not in acute distress.    Appearance: Normal appearance. She is well-developed. She is not diaphoretic.  HENT:     Head: Normocephalic and atraumatic.     Nose: Nose normal.     Mouth/Throat:     Pharynx: No oropharyngeal exudate.  Eyes:     Extraocular Movements: Extraocular movements intact.     Conjunctiva/sclera: Conjunctivae normal.     Pupils: Pupils are equal, round, and reactive to light.  Neck:     Thyroid: No thyromegaly.     Vascular: No carotid bruit or JVD.     Trachea: No tracheal deviation.  Cardiovascular:     Rate and Rhythm: Normal rate and regular  rhythm.     Pulses: Normal pulses.          Dorsalis pedis pulses are 2+ on the right side and 2+ on the left side.       Posterior tibial pulses are 2+ on the right side and 2+ on the left side.     Heart sounds: Normal heart sounds. No murmur. No friction rub. No gallop.   Pulmonary:     Effort: Pulmonary effort is normal. No respiratory distress.     Breath sounds: Normal breath sounds. No wheezing or rales.  Chest:     Chest wall: No tenderness.     Breasts:        Right: Normal. No swelling, bleeding, inverted nipple, mass, nipple discharge, skin change or tenderness.  Left: Normal. No swelling, bleeding, inverted nipple, mass, nipple discharge, skin change or tenderness.  Abdominal:     General: Bowel sounds are normal.     Palpations: Abdomen is soft.     Tenderness: There is no abdominal tenderness.     Hernia: There is no hernia in the left inguinal area or right inguinal area.  Genitourinary:    General: Normal vulva.     Exam position: Supine.     Labia:        Right: No tenderness or lesion.        Left: No tenderness or lesion.      Vagina: Normal. No vaginal discharge, erythema, tenderness or bleeding.     Cervix: No discharge, friability, erythema or cervical bleeding.     Uterus: Normal.      Adnexa: Right adnexa normal and left adnexa normal.     Comments: No tenderness, masses, or organomeglay present during bimanual exam . Musculoskeletal:        General: Normal range of motion.     Cervical back: Normal range of motion and neck supple.     Right foot: Normal range of motion. No deformity.     Left foot: Normal range of motion. No deformity.  Feet:     Right foot:     Protective Sensation: 10 sites tested. 10 sites sensed.     Skin integrity: Skin integrity normal.     Toenail Condition: Right toenails are normal.     Left foot:     Protective Sensation: 10 sites tested. 10 sites sensed.     Skin integrity: Skin integrity normal.     Toenail  Condition: Left toenails are normal.  Lymphadenopathy:     Cervical: No cervical adenopathy.     Upper Body:     Right upper body: No axillary adenopathy.     Left upper body: No axillary adenopathy.     Lower Body: No right inguinal adenopathy. No left inguinal adenopathy.  Skin:    General: Skin is warm and dry.     Capillary Refill: Capillary refill takes less than 2 seconds.  Neurological:     Mental Status: She is alert and oriented to person, place, and time.     Cranial Nerves: No cranial nerve deficit.  Psychiatric:        Behavior: Behavior normal.        Thought Content: Thought content normal.        Judgment: Judgment normal.      LABS: Recent Results (from the past 2160 hour(s))  Comprehensive metabolic panel     Status: Abnormal   Collection Time: 03/22/19  8:48 AM  Result Value Ref Range   Glucose 182 (H) 65 - 99 mg/dL   BUN 13 6 - 24 mg/dL   Creatinine, Ser 0.88 0.57 - 1.00 mg/dL   GFR calc non Af Amer 73 >59 mL/min/1.73   GFR calc Af Amer 84 >59 mL/min/1.73   BUN/Creatinine Ratio 15 9 - 23   Sodium 139 134 - 144 mmol/L   Potassium 4.2 3.5 - 5.2 mmol/L   Chloride 102 96 - 106 mmol/L   CO2 25 20 - 29 mmol/L   Calcium 9.0 8.7 - 10.2 mg/dL   Total Protein 6.5 6.0 - 8.5 g/dL   Albumin 4.3 3.8 - 4.9 g/dL   Globulin, Total 2.2 1.5 - 4.5 g/dL   Albumin/Globulin Ratio 2.0 1.2 - 2.2   Bilirubin Total 0.6 0.0 - 1.2 mg/dL  Alkaline Phosphatase 63 39 - 117 IU/L   AST 12 0 - 40 IU/L   ALT 14 0 - 32 IU/L  CBC with Differential/Platelet     Status: None   Collection Time: 03/22/19  8:48 AM  Result Value Ref Range   WBC 6.4 3.4 - 10.8 x10E3/uL   RBC 3.79 3.77 - 5.28 x10E6/uL   Hemoglobin 11.7 11.1 - 15.9 g/dL   Hematocrit 10.2 72.5 - 46.6 %   MCV 93 79 - 97 fL   MCH 30.9 26.6 - 33.0 pg   MCHC 33.3 31.5 - 35.7 g/dL   RDW 36.6 44.0 - 34.7 %   Platelets 228 150 - 450 x10E3/uL   Neutrophils 61 Not Estab. %   Lymphs 27 Not Estab. %   Monocytes 8 Not Estab. %    Eos 2 Not Estab. %   Basos 1 Not Estab. %   Neutrophils Absolute 3.9 1.4 - 7.0 x10E3/uL   Lymphocytes Absolute 1.7 0.7 - 3.1 x10E3/uL   Monocytes Absolute 0.5 0.1 - 0.9 x10E3/uL   EOS (ABSOLUTE) 0.1 0.0 - 0.4 x10E3/uL   Basophils Absolute 0.1 0.0 - 0.2 x10E3/uL   Immature Granulocytes 1 Not Estab. %   Immature Grans (Abs) 0.0 0.0 - 0.1 x10E3/uL  Lipid panel     Status: None   Collection Time: 03/22/19  8:48 AM  Result Value Ref Range   Cholesterol, Total 128 100 - 199 mg/dL   Triglycerides 425 0 - 149 mg/dL   HDL 54 >95 mg/dL   VLDL Cholesterol Cal 20 5 - 40 mg/dL   LDL Chol Calc (NIH) 54 0 - 99 mg/dL   Chol/HDL Ratio 2.4 0.0 - 4.4 ratio    Comment:                                   T. Chol/HDL Ratio                                             Men  Women                               1/2 Avg.Risk  3.4    3.3                                   Avg.Risk  5.0    4.4                                2X Avg.Risk  9.6    7.1                                3X Avg.Risk 23.4   11.0   POCT HgB A1C     Status: Abnormal   Collection Time: 04/27/19 11:35 AM  Result Value Ref Range   Hemoglobin A1C 6.1 (A) 4.0 - 5.6 %   HbA1c POC (<> result, manual entry)     HbA1c, POC (prediabetic range)     HbA1c, POC (controlled diabetic range)      Assessment/Plan:  1. Encounter for general adult medical examination with abnormal findings Annual health maintenance exam with pap smear today.   2. Type 2 diabetes mellitus with hyperglycemia, unspecified whether long term insulin use (HCC) HgbA1c 6.1 today. Continue diabetic medicaiton as prescribed. Refer for diabetic eye exam.  - POCT HgB A1C - Ambulatory referral to Ophthalmology  3. Coronary artery disease of native artery of native heart with stable angina pectoris [facility](HCC) Continue regular visits with cardiology.   4. Mild intermittent asthma without complication May use ventolin inhaler as needed and as prescribed.  - VENTOLIN HFA 108 (90 Base)  MCG/ACT inhaler; Inhale 1-2 puffs into the lungs daily.  Dispense: 18 g; Refill: 3  5. Routine cervical smear - Pap IG and HPV (high risk) DNA detection    General Counseling: Aggie Cosierheresa verbalizes understanding of the findings of todays visit and agrees with plan of treatment. I have discussed any further diagnostic evaluation that may be needed or ordered today. We also reviewed her medications today. she has been encouraged to call the office with any questions or concerns that should arise related to todays visit.    Counseling:  Diabetes Counseling:  1. Addition of ACE inh/ ARB'S for nephroprotection. Microalbumin is updated  2. Diabetic foot care, prevention of complications. Podiatry consult 3. Exercise and lose weight.  4. Diabetic eye examination, Diabetic eye exam is updated  5. Monitor blood sugar closlely. nutrition counseling.  6. Sign and symptoms of hypoglycemia including shaking sweating,confusion and headaches.   This patient was seen by Vincent GrosHeather Miraj Truss FNP Collaboration with Dr Lyndon CodeFozia M Khan as a part of collaborative care agreement  Orders Placed This Encounter  Procedures  . Ambulatory referral to Ophthalmology  . POCT HgB A1C    Meds ordered this encounter  Medications  . VENTOLIN HFA 108 (90 Base) MCG/ACT inhaler    Sig: Inhale 1-2 puffs into the lungs daily.    Dispense:  18 g    Refill:  3    Order Specific Question:   Supervising Provider    Answer:   Lyndon CodeKHAN, FOZIA M [[id number]]    Time spent: 4845 Minutes      Lyndon CodeFozia M Khan, MD  Internal Medicine

## 2019-04-28 DIAGNOSIS — Z124 Encounter for screening for malignant neoplasm of cervix: Secondary | ICD-10-CM | POA: Insufficient documentation

## 2019-04-28 DIAGNOSIS — Z0001 Encounter for general adult medical examination with abnormal findings: Secondary | ICD-10-CM | POA: Insufficient documentation

## 2019-04-28 DIAGNOSIS — J4520 Mild intermittent asthma, uncomplicated: Secondary | ICD-10-CM | POA: Insufficient documentation

## 2019-05-01 ENCOUNTER — Other Ambulatory Visit: Payer: Self-pay | Admitting: Internal Medicine

## 2019-05-01 MED ORDER — ATORVASTATIN CALCIUM 80 MG PO TABS: 80.0000 mg | ORAL_TABLET | Freq: Every day | ORAL | 3 refills | Status: DC

## 2019-05-01 MED ORDER — ISOSORBIDE MONONITRATE ER 60 MG PO TB24: 90.0000 mg | ORAL_TABLET | Freq: Two times a day (BID) | ORAL | 2 refills | Status: DC

## 2019-05-01 MED ORDER — CLOPIDOGREL BISULFATE 75 MG PO TABS: 75.0000 mg | ORAL_TABLET | Freq: Every day | ORAL | 3 refills | Status: DC

## 2019-05-01 MED ORDER — HYDROCHLOROTHIAZIDE 25 MG PO TABS: 25.0000 mg | ORAL_TABLET | Freq: Every day | ORAL | 3 refills | Status: DC

## 2019-05-01 NOTE — Telephone Encounter (Signed)
°*  STAT* If patient is at the pharmacy, call can be transferred to refill team.   1. Which medications need to be refilled? (please list name of each medication and dose if known) atorvastanin 80 mg, hydrochlorotiazide 25 mg, plavix 75mg  daily, isosorbide mononitrate 90 mg bid  2. Which pharmacy/location (including street and city if local pharmacy) is medication to be sent to? [facility] [place]  3. Do they need a 30 day or 90 day supply? 90 day  Patient is out of  isosorbide mononitrate 90 mg bid and running low on others

## 2019-05-03 LAB — PAP IG AND HPV HIGH-RISK: HPV, high-risk: NEGATIVE

## 2019-05-04 ENCOUNTER — Telehealth: Payer: Self-pay

## 2019-05-04 NOTE — Progress Notes (Signed)
Please let the patient know that her pap smear was normal. Thanks.

## 2019-05-04 NOTE — Telephone Encounter (Signed)
Patient notified of normal pap 

## 2019-05-30 ENCOUNTER — Ambulatory Visit: Payer: 59 | Admitting: Nurse Practitioner

## 2019-05-30 ENCOUNTER — Telehealth: Payer: Self-pay

## 2019-05-30 ENCOUNTER — Other Ambulatory Visit: Payer: Self-pay

## 2019-05-30 VITALS — Temp 96.7°F | Ht 62.5 in | Wt 195.0 lb

## 2019-05-30 DIAGNOSIS — J0140 Acute pansinusitis, unspecified: Secondary | ICD-10-CM | POA: Diagnosis not present

## 2019-05-30 DIAGNOSIS — J300 Vasomotor rhinitis: Secondary | ICD-10-CM | POA: Diagnosis not present

## 2019-05-30 DIAGNOSIS — J4520 Mild intermittent asthma, uncomplicated: Secondary | ICD-10-CM

## 2019-05-30 MED ORDER — FLUTICASONE PROPIONATE 50 MCG/ACT NA SUSP: 2.0000 | Freq: Every day | NASAL | 6 refills | Status: DC

## 2019-05-30 MED ORDER — AZITHROMYCIN 250 MG PO TABS: ORAL_TABLET | ORAL | 0 refills | Status: DC

## 2019-05-30 NOTE — Progress Notes (Signed)
[facility] [place], [place] [address]  Internal MEDICINE  Telephone Visit  Patient Name: Sherry Torres  [id number]  [id number]  Date of Service: 06/03/2019  I connected with the patient at 5:08pm by webcam and verified the patients identity using two identifiers.   I discussed the limitations, risks, security and privacy concerns of performing an evaluation and management service by webcam and the availability of in person appointments. I also discussed with the patient that there may be a patient responsible charge related to the service.  The patient expressed understanding and agrees to proceed.    Chief Complaint  Patient presents with  . Telephone Assessment  . Telephone Screen  . Sinusitis  . Ear Pain    left    The patient has been contacted via webcam for follow up visit due to concerns for spread of novel coronavirus. The patient present for acute visit. The patient states that she believes she has sinus infection. Has left ear fullness and left maxillary sinus pain and pressure. She denies fever. Has to check temperature every day at work. She does not have fever. She denies nausea or vomiting. She has not lost smell or taste.       Current Medication: Outpatient Encounter Medications as of 05/30/2019  Medication Sig  . aspirin 81 MG chewable tablet Chew 1 tablet (81 mg total) by mouth daily.  Marland Kitchen atorvastatin (LIPITOR) 80 MG tablet Take 1 tablet (80 mg total) by mouth daily at 6 PM.  . bisoprolol (ZEBETA) 5 MG tablet Take 1 tablet (5 mg total) by mouth daily.  Marland Kitchen buPROPion (ZYBAN) 150 MG 12 hr tablet Take 1 tablet (150 mg total) by mouth 2 (two) times daily.  . clopidogrel (PLAVIX) 75 MG tablet Take 1 tablet (75 mg total) by mouth daily with breakfast.  . cyclobenzaprine (FLEXERIL) 5 MG tablet Take 1 tablet (5 mg total) by mouth 3 (three) times daily as needed for muscle spasms.  . diclofenac sodium (VOLTAREN) 1 % GEL Apply 4 g topically 4  (four) times daily.  . furosemide (LASIX) 20 MG tablet Take 1 tablet (20 mg total) by mouth daily as needed for edema (For swelling and increased weight gain.).  Marland Kitchen hydrochlorothiazide (HYDRODIURIL) 25 MG tablet Take 1 tablet (25 mg total) by mouth daily.  . isosorbide mononitrate (IMDUR) 60 MG 24 hr tablet Take 1.5 tablets (90 mg total) by mouth 2 (two) times daily.  . nitroGLYCERIN (NITROSTAT) 0.4 MG SL tablet Place 1 tablet (0.4 mg total) under the tongue every 5 (five) minutes as needed for chest pain.  Marland Kitchen olmesartan (BENICAR) 5 MG tablet Take 1 tablet (5 mg total) by mouth daily.  . ranolazine (RANEXA) 1000 MG SR tablet Take 1 tablet (1,000 mg total) by mouth daily.  . VENTOLIN HFA 108 (90 Base) MCG/ACT inhaler Inhale 1-2 puffs into the lungs daily.  Marland Kitchen azithromycin (ZITHROMAX) 250 MG tablet z-pack - take as directed for 5 days for sinusitis  . fluticasone (FLONASE) 50 MCG/ACT nasal spray Place 2 sprays into both nostrils daily.   No facility-administered encounter medications on file as of 05/30/2019.    Surgical History: Past Surgical History:  Procedure Laterality Date  . LEFT HEART CATH AND CORONARY ANGIOGRAPHY N/A 01/06/2018   Procedure: LEFT HEART CATH AND CORONARY ANGIOGRAPHY;  Surgeon: Wellington Hampshire, MD;  Location: [facility] [facility];  Service: Cardiovascular;  Laterality: N/A;    Medical History: Past Medical History:  Diagnosis Date  .  Anxiety   . Asthma   . CAD (coronary artery disease)    a. 12/2017 NSTEMI;  b. 12/2017 MV: basal and mid inflat ischemia; c. 12/2017 Cath: LM nl, LAD 30ost, D1 mild dzs, LCX 40ost, 95d, RCA 72m (nondominant). LCX felt to be culprit but only 1.30mm vessel-->Med rx.  . Diastolic dysfunction    a. 12/2017 Echo: EF 55-60%, no rwma, Gr1 DD; b. 01/2018 Echo: EF 60-65%, gr2 DD, no rwma, mild MR, nl RV fxn.  . Hyperlipidemia LDL goal <70   . Hypertension   . Recurrent major depressive disorder, in partial remission (HCC) 09/17/2017  . Tobacco abuse    . Type 2 diabetes mellitus (HCC)     Family History: Family History  Problem Relation Age of Onset  . Cancer Mother   . Breast cancer Mother 44  . Hyperlipidemia Father     Social History   Socioeconomic History  . Marital status: Married    Spouse name: Not on file  . Number of children: Not on file  . Years of education: Not on file  . Highest education level: Not on file  Occupational History  . Not on file  Tobacco Use  . Smoking status: Former Smoker    Types: Cigarettes  . Smokeless tobacco: Never Used  Substance and Sexual Activity  . Alcohol use: No  . Drug use: No  . Sexual activity: Not on file  Other Topics Concern  . Not on file  Social History Narrative  . Not on file   Social Determinants of Health   Financial Resource Strain:   . Difficulty of Paying Living Expenses: Not on file  Food Insecurity:   . Worried About Programme researcher, broadcasting/film/video in the Last Year: Not on file  . Ran Out of Food in the Last Year: Not on file  Transportation Needs:   . Lack of Transportation (Medical): Not on file  . Lack of Transportation (Non-Medical): Not on file  Physical Activity:   . Days of Exercise per Week: Not on file  . Minutes of Exercise per Session: Not on file  Stress:   . Feeling of Stress : Not on file  Social Connections:   . Frequency of Communication with Friends and Family: Not on file  . Frequency of Social Gatherings with Friends and Family: Not on file  . Attends Religious Services: Not on file  . Active Member of Clubs or Organizations: Not on file  . Attends Banker Meetings: Not on file  . Marital Status: Not on file  Intimate Partner Violence:   . Fear of Current or Ex-Partner: Not on file  . Emotionally Abused: Not on file  . Physically Abused: Not on file  . Sexually Abused: Not on file      Review of Systems  Constitutional: Positive for fatigue. Negative for activity change, appetite change, chills, fever and unexpected  weight change.  HENT: Positive for congestion, ear pain, postnasal drip, rhinorrhea, sinus pressure and sinus pain. Negative for sneezing and sore throat.   Respiratory: Negative for cough, chest tightness, shortness of breath and wheezing.   Cardiovascular: Negative for chest pain and palpitations.  Gastrointestinal: Negative for abdominal pain, constipation, diarrhea, nausea and vomiting.  Musculoskeletal: Negative for arthralgias, back pain, joint swelling and neck pain.  Skin: Negative for rash.  Neurological: Positive for headaches. Negative for tremors and numbness.  Hematological: Negative for adenopathy. Does not bruise/bleed easily.  Psychiatric/Behavioral: Negative for behavioral problems (Depression), sleep disturbance  and suicidal ideas. The patient is not nervous/anxious.     Today's Vitals   05/30/19 1631  Temp: (!) 96.7 F (35.9 C)  Weight: 195 lb (88.5 kg)  Height: 5' 2.5" (1.588 m)   Body mass index is 35.1 kg/m.   Observation/Objective:   The patient is alert and oriented. She is pleasant and answers all questions appropriately. Breathing is non-labored. She is in no acute distress at this time. She is nasally congested and appears to feel poorly.    Assessment/Plan: 1. Acute non-recurrent pansinusitis Start z-pack. Take as directed for 5 days. Rest and increase fluids. Recommend OTC medication as needed and as indicated to improve acute symptoms.  - azithromycin (ZITHROMAX) 250 MG tablet; z-pack - take as directed for 5 days for sinusitis  Dispense: 6 tablet; Refill: 0  2. Vasomotor rhinitis Add flonase nasal spray. Use 2 sprays in both nostrils daily - fluticasone (FLONASE) 50 MCG/ACT nasal spray; Place 2 sprays into both nostrils daily.  Dispense: 16 g; Refill: 6  3. Mild intermittent asthma without complication Use inhalers and respiratory treatments as prescribed  General Counseling: Velvet verbalizes understanding of the findings of today's phone  visit and agrees with plan of treatment. I have discussed any further diagnostic evaluation that may be needed or ordered today. We also reviewed her medications today. she has been encouraged to call the office with any questions or concerns that should arise related to todays visit.  This patient was seen by Vincent Gros FNP Collaboration with Dr Lyndon Code as a part of collaborative care agreement  Meds ordered this encounter  Medications  . fluticasone (FLONASE) 50 MCG/ACT nasal spray    Sig: Place 2 sprays into both nostrils daily.    Dispense:  16 g    Refill:  6    Order Specific Question:   Supervising Provider    Answer:   Lyndon Code [[id number]]  . azithromycin (ZITHROMAX) 250 MG tablet    Sig: z-pack - take as directed for 5 days for sinusitis    Dispense:  6 tablet    Refill:  0    Order Specific Question:   Supervising Provider    Answer:   Lyndon Code [[id number]]    Time spent: 61 Minutes    Dr Lyndon Code Internal medicine

## 2019-05-30 NOTE — Telephone Encounter (Signed)
KEY: [id number] Ranolazine ER 1000mg  tablets PA completed in . Waiting for response from [facility])

## 2019-06-03 ENCOUNTER — Encounter: Payer: Self-pay | Admitting: Nurse Practitioner

## 2019-06-03 DIAGNOSIS — J300 Vasomotor rhinitis: Secondary | ICD-10-CM | POA: Insufficient documentation

## 2019-06-23 ENCOUNTER — Telehealth: Payer: Self-pay | Admitting: Internal Medicine

## 2019-06-23 NOTE — Telephone Encounter (Signed)
Patient recently changed insurance to [facility] and they are not approving renexa. Patient has been without this medication for a month now. Please advise patient if we are able to assist or give an alternative

## 2019-06-27 NOTE — Telephone Encounter (Signed)
No answer. Left message to call back.   

## 2019-06-27 NOTE — Telephone Encounter (Signed)
If the patient has not had any significant chest pain since stopping ranolazine, I think it would be best to continue without it.  If her symptoms have worsened, we could try adding amlodipine 2.5 mg daily.  There is no comparable medication to ranolazine but the amlodipine may provide her with some additional anginal relief and is also much less expensive.  Yvonne Kendall, MD [facility]

## 2019-06-27 NOTE — Telephone Encounter (Signed)
Please advise concerning prior authorization for Ranolazine 1000mg  daily. Faxed received PA needed.  Prior approval pending, do you know status of previous approval submitted?

## 2019-06-28 MED ORDER — AMLODIPINE BESYLATE 2.5 MG PO TABS: 2.5000 mg | ORAL_TABLET | Freq: Every day | ORAL | 5 refills | Status: DC

## 2019-06-28 NOTE — Telephone Encounter (Signed)
Patient states she can tell a difference since stopping the ranolazine and has chest pain at times. She would like to try the amlodipine to see if that will help. Rx sent to pharmacy. She will let us know if any questions or concerns arise.

## 2019-06-28 NOTE — Telephone Encounter (Signed)
Per [name] at [facility], ranolazine was denied due to criteria not being met/non approved diagnosis. Spoke with [name] in [place] and completed appeal verbally over the phone. Ref# [id number] All office notes faxed to [phone] from 03/2018 to present as requested. Waiting for determination.

## 2019-06-30 ENCOUNTER — Telehealth: Payer: Self-pay

## 2019-06-30 ENCOUNTER — Other Ambulatory Visit: Payer: Self-pay | Admitting: Nurse Practitioner

## 2019-06-30 DIAGNOSIS — J0140 Acute pansinusitis, unspecified: Secondary | ICD-10-CM

## 2019-06-30 MED ORDER — AMOXICILLIN 875 MG PO TABS: 875.0000 mg | ORAL_TABLET | Freq: Two times a day (BID) | ORAL | 0 refills | Status: DC

## 2019-06-30 MED ORDER — RANOLAZINE ER 1000 MG PO TB12: 1000.0000 mg | ORAL_TABLET | Freq: Every day | ORAL | 3 refills | Status: DC

## 2019-06-30 NOTE — Addendum Note (Signed)
Addended by: Stann Mainland on: 06/30/2019 04:30 PM   Modules accepted: Orders

## 2019-06-30 NOTE — Telephone Encounter (Signed)
Called and let patient know. She would prefer to stay on the ranolazine since it was approved. I will make sure this is ok with Dr End.   Discussed with Dr End and he said ok to remain on ranolazine 1000 mg once a day and not start the Amlodipine.  Called patient back and she was pleased with this. Rx for ranolazine sent to pharmacy. Amlodipine discontinued from patient's list. She knows not to start the amlodipine.

## 2019-06-30 NOTE — Telephone Encounter (Signed)
Per fax from [employer], a medication exception for ranolazine 1000mg  daily has been approved from 06/29/2019 to 06/28/2020.

## 2019-06-30 NOTE — Telephone Encounter (Signed)
It was actually 1/12 when I saw her. But, Sent prescription for amoxicillin 875mg  bid for 10 days to her pharmacy. She should take OTC medications as needed and as indicated toimprove acute symptoms. Please have her make appointment if symptoms no better in next week. Thanks.

## 2019-06-30 NOTE — Addendum Note (Signed)
Addended by: Stann Mainland on: 06/30/2019 04:28 PM   Modules accepted: Orders

## 2019-06-30 NOTE — Telephone Encounter (Signed)
Pt advised we send if not feeling better need to make virtual appt for next week

## 2019-06-30 NOTE — Progress Notes (Signed)
Sent prescription for amoxicillin 875mg  bid for 10 days to her pharmacy. She should take OTC medications as needed and as indicated toimprove acute symptoms.

## 2019-07-03 ENCOUNTER — Other Ambulatory Visit: Payer: Self-pay

## 2019-07-03 ENCOUNTER — Other Ambulatory Visit: Payer: Self-pay | Admitting: Internal Medicine

## 2019-07-03 DIAGNOSIS — I10 Essential (primary) hypertension: Secondary | ICD-10-CM

## 2019-07-03 MED ORDER — BISOPROLOL FUMARATE 5 MG PO TABS: 5.0000 mg | ORAL_TABLET | Freq: Every day | ORAL | 3 refills | Status: DC

## 2019-07-31 ENCOUNTER — Ambulatory Visit: Payer: BLUE CROSS/BLUE SHIELD | Admitting: Nurse Practitioner

## 2019-08-10 ENCOUNTER — Telehealth: Payer: Self-pay

## 2019-08-10 NOTE — Telephone Encounter (Signed)
Confirmed appointment on 08/14/2019 and screened for covid. klh 

## 2019-08-14 ENCOUNTER — Other Ambulatory Visit: Payer: Self-pay

## 2019-08-14 ENCOUNTER — Ambulatory Visit (INDEPENDENT_AMBULATORY_CARE_PROVIDER_SITE_OTHER): Payer: 59 | Admitting: Adult Health

## 2019-08-14 ENCOUNTER — Encounter: Payer: Self-pay | Admitting: Adult Health

## 2019-08-14 VITALS — BP 150/61 | HR 72 | Temp 97.4°F | Resp 16 | Ht 62.0 in | Wt 209.2 lb

## 2019-08-14 DIAGNOSIS — M25572 Pain in left ankle and joints of left foot: Secondary | ICD-10-CM | POA: Diagnosis not present

## 2019-08-14 DIAGNOSIS — R0602 Shortness of breath: Secondary | ICD-10-CM | POA: Diagnosis not present

## 2019-08-14 DIAGNOSIS — E1165 Type 2 diabetes mellitus with hyperglycemia: Secondary | ICD-10-CM

## 2019-08-14 DIAGNOSIS — Z79899 Other long term (current) drug therapy: Secondary | ICD-10-CM

## 2019-08-14 LAB — POCT GLYCOSYLATED HEMOGLOBIN (HGB A1C): Hemoglobin A1C: 7.5 % — AB (ref 4.0–5.6)

## 2019-08-14 MED ORDER — METFORMIN HCL 500 MG PO TABS: 500.0000 mg | ORAL_TABLET | Freq: Every day | ORAL | 3 refills | Status: DC

## 2019-08-14 NOTE — Progress Notes (Signed)
[facility] [address] [place], [place] [address]  Internal MEDICINE  Office Visit Note  Patient Name: Sherry Torres  [id number]  [id number]  Date of Service: 08/14/2019  Chief Complaint  Patient presents with  . Acute Visit    left ankle swollen   . Depression  . Diabetes  . Hypertension  . Hyperlipidemia    HPI Patient is here for routine follow-up on DM. Has been off Metformin since July 2020 due to normal A1C for several months. Since being off her A1C has steadily increased, she is wanting to start back on Metformin at this time. She also states that since being off Metformin she has not been eating well or getting any exercise. Continues to complain of left lower leg swelling and ankle/lower extremity pain/tenderness. This swelling has been ongoing for several months with no improvement. She has tried elevating her leg at night, swelling goes down but comes back each evening. ABI's performed in office to rule out blood clot, ABI's were normal today. Will get doppler to further evaluate swelling. Seen by cardiology and is taking furosemide as well as HCTZ. Mild shortness of breath today that she says is her baseline. History of cigarette smoking in the past. Uses albuterol inhaler as needed. Will obtain PFT to get baseline pulmonary function. Denies chest pain, cough or headache.    Current Medication: Outpatient Encounter Medications as of 08/14/2019  Medication Sig  . amoxicillin (AMOXIL) 875 MG tablet Take 1 tablet (875 mg total) by mouth 2 (two) times daily.  Marland Kitchen aspirin 81 MG chewable tablet Chew 1 tablet (81 mg total) by mouth daily.  Marland Kitchen atorvastatin (LIPITOR) 80 MG tablet Take 1 tablet (80 mg total) by mouth daily at 6 PM.  . azithromycin (ZITHROMAX) 250 MG tablet z-pack - take as directed for 5 days for sinusitis  . bisoprolol (ZEBETA) 5 MG tablet Take 1 tablet (5 mg total) by mouth daily.  Marland Kitchen buPROPion (ZYBAN) 150 MG 12 hr tablet Take 1 tablet (150 mg  total) by mouth 2 (two) times daily.  . clopidogrel (PLAVIX) 75 MG tablet Take 1 tablet (75 mg total) by mouth daily with breakfast.  . cyclobenzaprine (FLEXERIL) 5 MG tablet Take 1 tablet (5 mg total) by mouth 3 (three) times daily as needed for muscle spasms.  . diclofenac sodium (VOLTAREN) 1 % GEL Apply 4 g topically 4 (four) times daily.  . fluticasone (FLONASE) 50 MCG/ACT nasal spray Place 2 sprays into both nostrils daily.  . furosemide (LASIX) 20 MG tablet PLEASE SEE ATTACHED FOR DETAILED DIRECTIONS  . isosorbide mononitrate (IMDUR) 60 MG 24 hr tablet Take 1.5 tablets (90 mg total) by mouth 2 (two) times daily.  . nitroGLYCERIN (NITROSTAT) 0.4 MG SL tablet Place 1 tablet (0.4 mg total) under the tongue every 5 (five) minutes as needed for chest pain.  Marland Kitchen olmesartan (BENICAR) 5 MG tablet TAKE 1 TABLET BY MOUTH EVERY DAY  . ranolazine (RANEXA) 1000 MG SR tablet TAKE 1 TABLET BY MOUTH TWICE A DAY  . VENTOLIN HFA 108 (90 Base) MCG/ACT inhaler Inhale 1-2 puffs into the lungs daily.  . hydrochlorothiazide (HYDRODIURIL) 25 MG tablet Take 1 tablet (25 mg total) by mouth daily.  . metFORMIN (GLUCOPHAGE) 500 MG tablet Take 1 tablet (500 mg total) by mouth daily with breakfast.   No facility-administered encounter medications on file as of 08/14/2019.    Surgical History: Past Surgical History:  Procedure Laterality Date  . LEFT HEART CATH AND CORONARY ANGIOGRAPHY  N/A 01/06/2018   Procedure: LEFT HEART CATH AND CORONARY ANGIOGRAPHY;  Surgeon: Iran Ouch, MD;  Location: [facility];  Service: Cardiovascular;  Laterality: N/A;    Medical History: Past Medical History:  Diagnosis Date  . Anxiety   . Asthma   . CAD (coronary artery disease)    a. 12/2017 NSTEMI;  b. 12/2017 MV: basal and mid inflat ischemia; c. 12/2017 Cath: LM nl, LAD 30ost, D1 mild dzs, LCX 40ost, 95d, RCA 62m (nondominant). LCX felt to be culprit but only 1.27mm vessel-->Med rx.  . Diastolic dysfunction    a.  12/2017 Echo: EF 55-60%, no rwma, Gr1 DD; b. 01/2018 Echo: EF 60-65%, gr2 DD, no rwma, mild MR, nl RV fxn.  . Hyperlipidemia LDL goal <70   . Hypertension   . Recurrent major depressive disorder, in partial remission (HCC) 09/17/2017  . Tobacco abuse   . Type 2 diabetes mellitus (HCC)     Family History: Family History  Problem Relation Age of Onset  . Cancer Mother   . Breast cancer Mother 33  . Hyperlipidemia Father     Social History   Socioeconomic History  . Marital status: Married    Spouse name: Not on file  . Number of children: Not on file  . Years of education: Not on file  . Highest education level: Not on file  Occupational History  . Not on file  Tobacco Use  . Smoking status: Former Smoker    Types: Cigarettes  . Smokeless tobacco: Never Used  Substance and Sexual Activity  . Alcohol use: No  . Drug use: No  . Sexual activity: Not on file  Other Topics Concern  . Not on file  Social History Narrative  . Not on file   Social Determinants of Health   Financial Resource Strain:   . Difficulty of Paying Living Expenses:   Food Insecurity:   . Worried About Programme researcher, broadcasting/film/video in the Last Year:   . Barista in the Last Year:   Transportation Needs:   . Freight forwarder (Medical):   Marland Kitchen Lack of Transportation (Non-Medical):   Physical Activity:   . Days of Exercise per Week:   . Minutes of Exercise per Session:   Stress:   . Feeling of Stress :   Social Connections:   . Frequency of Communication with Friends and Family:   . Frequency of Social Gatherings with Friends and Family:   . Attends Religious Services:   . Active Member of Clubs or Organizations:   . Attends Banker Meetings:   Marland Kitchen Marital Status:   Intimate Partner Violence:   . Fear of Current or Ex-Partner:   . Emotionally Abused:   Marland Kitchen Physically Abused:   . Sexually Abused:       Review of Systems  Constitutional: Negative for chills, fatigue and unexpected  weight change.  HENT: Negative for congestion, rhinorrhea, sneezing and sore throat.   Eyes: Negative for photophobia, pain and redness.  Respiratory: Negative for cough, chest tightness and shortness of breath.   Cardiovascular: Negative for chest pain and palpitations.  Gastrointestinal: Negative for abdominal pain, constipation, diarrhea, nausea and vomiting.  Endocrine: Negative.   Genitourinary: Negative for dysuria and frequency.  Musculoskeletal: Negative for arthralgias, back pain, joint swelling and neck pain.       Left lower leg swelling and pain  Skin: Negative for rash.  Allergic/Immunologic: Negative.   Neurological: Negative for tremors and  numbness.  Hematological: Negative for adenopathy. Does not bruise/bleed easily.  Psychiatric/Behavioral: Negative for behavioral problems and sleep disturbance. The patient is not nervous/anxious.     Vital Signs: BP (!) 150/61   Pulse 72   Temp (!) 97.4 F (36.3 C)   Resp 16   Ht 5\' 2"  (1.575 m)   Wt 209 lb 3.2 oz (94.9 kg)   SpO2 98%   BMI 38.26 kg/m    Physical Exam Vitals and nursing note reviewed.  Constitutional:      General: She is not in acute distress.    Appearance: She is well-developed. She is not diaphoretic.  HENT:     Head: Normocephalic and atraumatic.     Mouth/Throat:     Pharynx: No oropharyngeal exudate.  Eyes:     Pupils: Pupils are equal, round, and reactive to light.  Neck:     Thyroid: No thyromegaly.     Vascular: No JVD.     Trachea: No tracheal deviation.  Cardiovascular:     Rate and Rhythm: Normal rate and regular rhythm.     Heart sounds: Normal heart sounds. No murmur. No friction rub. No gallop.   Pulmonary:     Effort: Pulmonary effort is normal. No respiratory distress.     Breath sounds: Normal breath sounds. No wheezing or rales.     Comments: Shortness of breath with removing shoe and with talking Chest:     Chest wall: No tenderness.  Abdominal:     Palpations: Abdomen is  soft.     Tenderness: There is no abdominal tenderness. There is no guarding.  Musculoskeletal:        General: Normal range of motion.     Cervical back: Normal range of motion and neck supple.     Left lower leg: Swelling and tenderness present.  Lymphadenopathy:     Cervical: No cervical adenopathy.  Skin:    General: Skin is warm and dry.  Neurological:     Mental Status: She is alert and oriented to person, place, and time.     Cranial Nerves: No cranial nerve deficit.  Psychiatric:        Behavior: Behavior normal.        Thought Content: Thought content normal.        Judgment: Judgment normal.    Assessment/Plan: 1. Uncontrolled type 2 diabetes mellitus with hyperglycemia (HCC) Since being off of Metformin, A1C has been steadily rising. Will start back her on 500 mg daily of Metformin. Encouraged her to keep a log of AM fasting blood glucose levels. - POCT HgB A1C - metFORMIN (GLUCOPHAGE) 500 MG tablet; Take 1 tablet (500 mg total) by mouth daily with breakfast.  Dispense: 180 tablet; Refill: 3  2. Shortness of breath History of smoking. Reports mild shortness of breath and noted on exam, reports that this is her normal. Will obtain PFT to review pulmonary function baseline. - Pulmonary Function Test; Future  3. Long term use of drug Has been on Furosemide since December 2020. Will need to closely monitor potassium levels. - Basic Metabolic Panel (BMET)  4. Left ankle pain, unspecified chronicity Left ankle swelling as well as left lower extremity swelling and pain. ABI normal toady. Will review doppler results and follow-up as appropriate. - POCT ABI Screening Pilot No Charge - VAS January 2021 LOWER EXTREMITY VENOUS (DVT); Future  General Counseling: Tyna verbalizes understanding of the findings of todays visit and agrees with plan of treatment. I have discussed any further  diagnostic evaluation that may be needed or ordered today. We also reviewed her medications today. she  has been encouraged to call the office with any questions or concerns that should arise related to todays visit.    Orders Placed This Encounter  Procedures  . Basic Metabolic Panel (BMET)  . POCT HgB A1C  . Pulmonary Function Test  . VAS [place] LOWER EXTREMITY VENOUS (DVT)  . POCT ABI Screening Pilot No Charge    Meds ordered this encounter  Medications  . metFORMIN (GLUCOPHAGE) 500 MG tablet    Sig: Take 1 tablet (500 mg total) by mouth daily with breakfast.    Dispense:  180 tablet    Refill:  3    Time spent: 30 Minutes   This patient was seen by Orson Gear AGNP-C in Collaboration with Dr Lavera Guise as a part of collaborative care agreement     Kendell Bane AGNP-C Internal medicine

## 2019-08-21 ENCOUNTER — Telehealth: Payer: Self-pay

## 2019-08-21 NOTE — Telephone Encounter (Signed)
Called lmom/lmom informing patient of appointment on 08/23/2019. klh

## 2019-08-23 ENCOUNTER — Ambulatory Visit: Payer: 59 | Admitting: Internal Medicine

## 2019-08-28 ENCOUNTER — Telehealth: Payer: Self-pay

## 2019-08-28 NOTE — Telephone Encounter (Signed)
Screened and confirmed pft

## 2019-08-30 ENCOUNTER — Other Ambulatory Visit: Payer: Self-pay

## 2019-08-30 ENCOUNTER — Ambulatory Visit: Payer: 59 | Admitting: Internal Medicine

## 2019-08-30 DIAGNOSIS — R0602 Shortness of breath: Secondary | ICD-10-CM

## 2019-08-30 LAB — PULMONARY FUNCTION TEST

## 2019-09-06 ENCOUNTER — Telehealth: Payer: Self-pay

## 2019-09-06 NOTE — Telephone Encounter (Signed)
Left message advising pt of ultrasound

## 2019-09-08 ENCOUNTER — Other Ambulatory Visit: Payer: Self-pay

## 2019-09-08 ENCOUNTER — Ambulatory Visit: Payer: 59

## 2019-09-08 DIAGNOSIS — M25572 Pain in left ankle and joints of left foot: Secondary | ICD-10-CM

## 2019-09-10 NOTE — Procedures (Signed)
[facility] [address] [place], [address]  DATE OF SERVICE: August 30, 2019  Complete Pulmonary Function Testing Interpretation:  FINDINGS:  The forced vital capacity is mildly decreased.  The FEV1 is 1.51 L which is 66% of predicted and is mildly decreased.  FEV1 FVC ratio is mildly decreased.  Postbronchodilator no significant change in the FEV1 clinical improvement may still occur in the absence of spirometric improvement.  Total lung capacity is normal residual volume is increased residual volume total lung capacity ratio is increased.  FRC is normal.  DLCO is normal.  IMPRESSION:  Pulmonary function study is suggestive of mild obstructive lung disease.  DLCO is normal lung volumes are normal.  Yevonne Pax, MD [facility] Sleep Medicine

## 2019-09-12 ENCOUNTER — Telehealth: Payer: Self-pay

## 2019-09-12 NOTE — Telephone Encounter (Signed)
Confirmed appointment on 09/14/2019 and screened for covid. klh 

## 2019-09-14 ENCOUNTER — Encounter: Payer: Self-pay | Admitting: Adult Health

## 2019-09-14 ENCOUNTER — Other Ambulatory Visit: Payer: Self-pay

## 2019-09-14 ENCOUNTER — Ambulatory Visit: Payer: 59 | Admitting: Nurse Practitioner

## 2019-09-14 VITALS — BP 138/59 | HR 66 | Temp 97.2°F | Resp 16 | Ht 62.0 in | Wt 203.6 lb

## 2019-09-14 DIAGNOSIS — J449 Chronic obstructive pulmonary disease, unspecified: Secondary | ICD-10-CM | POA: Diagnosis not present

## 2019-09-14 DIAGNOSIS — I10 Essential (primary) hypertension: Secondary | ICD-10-CM | POA: Diagnosis not present

## 2019-09-14 DIAGNOSIS — R600 Localized edema: Secondary | ICD-10-CM

## 2019-09-14 DIAGNOSIS — F411 Generalized anxiety disorder: Secondary | ICD-10-CM

## 2019-09-14 DIAGNOSIS — I25118 Atherosclerotic heart disease of native coronary artery with other forms of angina pectoris: Secondary | ICD-10-CM

## 2019-09-14 MED ORDER — ANORO ELLIPTA 62.5-25 MCG/INH IN AEPB: 1.0000 | INHALATION_SPRAY | Freq: Every day | RESPIRATORY_TRACT | 3 refills | Status: DC

## 2019-09-14 NOTE — Progress Notes (Signed)
[facility] [address] [place], [place] [address]  Internal MEDICINE  Office Visit Note  Patient Name: Sherry Torres  [id number]  [id number]  Date of Service: 09/27/2019  Chief Complaint  Patient presents with  . Follow-up    [place], pft results  . Depression  . Diabetes  . Hypertension  . Hyperlipidemia    The patient is here for routine follow up. She had been having some shortness of breath, especially with exertion. She states that she is having to use rescue inhaler at least once daily, sometimes, she has to use it two or three times. She did have PFT since her last visit. This shows mild restrictive lung disease with normal DLCO.  Also had been having pain and swelling of left lower extremity. At last visit, had ABI which was normal. She had arterial duplex study done which was normal. She states that pain and swelling have both improved.       Current Medication: Outpatient Encounter Medications as of 09/14/2019  Medication Sig  . aspirin 81 MG chewable tablet Chew 1 tablet (81 mg total) by mouth daily.  Marland Kitchen atorvastatin (LIPITOR) 80 MG tablet Take 1 tablet (80 mg total) by mouth daily at 6 PM.  . bisoprolol (ZEBETA) 5 MG tablet Take 1 tablet (5 mg total) by mouth daily.  Marland Kitchen buPROPion (ZYBAN) 150 MG 12 hr tablet Take 1 tablet (150 mg total) by mouth 2 (two) times daily.  . clopidogrel (PLAVIX) 75 MG tablet Take 1 tablet (75 mg total) by mouth daily with breakfast.  . cyclobenzaprine (FLEXERIL) 5 MG tablet Take 1 tablet (5 mg total) by mouth 3 (three) times daily as needed for muscle spasms.  . diclofenac sodium (VOLTAREN) 1 % GEL Apply 4 g topically 4 (four) times daily.  . fluticasone (FLONASE) 50 MCG/ACT nasal spray Place 2 sprays into both nostrils daily.  . furosemide (LASIX) 20 MG tablet PLEASE SEE ATTACHED FOR DETAILED DIRECTIONS (Patient taking differently: 1 tablet PO PRN)  . isosorbide mononitrate (IMDUR) 60 MG 24 hr tablet Take 1.5 tablets (90 mg  total) by mouth 2 (two) times daily.  . metFORMIN (GLUCOPHAGE) 500 MG tablet Take 1 tablet (500 mg total) by mouth daily with breakfast.  . nitroGLYCERIN (NITROSTAT) 0.4 MG SL tablet Place 1 tablet (0.4 mg total) under the tongue every 5 (five) minutes as needed for chest pain.  Marland Kitchen olmesartan (BENICAR) 5 MG tablet TAKE 1 TABLET BY MOUTH EVERY DAY  . VENTOLIN HFA 108 (90 Base) MCG/ACT inhaler Inhale 1-2 puffs into the lungs daily.  . [DISCONTINUED] amoxicillin (AMOXIL) 875 MG tablet Take 1 tablet (875 mg total) by mouth 2 (two) times daily.  . [DISCONTINUED] azithromycin (ZITHROMAX) 250 MG tablet z-pack - take as directed for 5 days for sinusitis  . [DISCONTINUED] ranolazine (RANEXA) 1000 MG SR tablet TAKE 1 TABLET BY MOUTH TWICE A DAY  . hydrochlorothiazide (HYDRODIURIL) 25 MG tablet Take 1 tablet (25 mg total) by mouth daily.  Marland Kitchen umeclidinium-vilanterol (ANORO ELLIPTA) 62.5-25 MCG/INH AEPB Inhale 1 puff into the lungs daily.   No facility-administered encounter medications on file as of 09/14/2019.    Surgical History: Past Surgical History:  Procedure Laterality Date  . CARDIAC CATHETERIZATION    . LEFT HEART CATH AND CORONARY ANGIOGRAPHY N/A 01/06/2018   Procedure: LEFT HEART CATH AND CORONARY ANGIOGRAPHY;  Surgeon: Iran Ouch, MD;  Location: [facility] INVASIVE CV LAB;  Service: Cardiovascular;  Laterality: N/A;    Medical History: Past Medical History:  Diagnosis Date  . Anxiety   . Asthma   . CAD (coronary artery disease)    a. 12/2017 NSTEMI;  b. 12/2017 MV: basal and mid inflat ischemia; c. 12/2017 Cath: LM nl, LAD 30ost, D1 mild dzs, LCX 40ost, 95d, RCA 60m (nondominant). LCX felt to be culprit but only 1.48mm vessel-->Med rx.  . Diastolic dysfunction    a. 12/2017 Echo: EF 55-60%, no rwma, Gr1 DD; b. 01/2018 Echo: EF 60-65%, gr2 DD, no rwma, mild MR, nl RV fxn.  . Hyperlipidemia LDL goal <70   . Hypertension   . Recurrent major depressive disorder, in partial remission (HCC)  09/17/2017  . Tobacco abuse   . Type 2 diabetes mellitus (HCC)     Family History: Family History  Problem Relation Age of Onset  . Cancer Mother   . Breast cancer Mother 34  . Hyperlipidemia Father     Social History   Socioeconomic History  . Marital status: Married    Spouse name: Not on file  . Number of children: Not on file  . Years of education: Not on file  . Highest education level: Not on file  Occupational History  . Not on file  Tobacco Use  . Smoking status: Former Smoker    Types: Cigarettes  . Smokeless tobacco: Never Used  Substance and Sexual Activity  . Alcohol use: No  . Drug use: No  . Sexual activity: Not on file  Other Topics Concern  . Not on file  Social History Narrative  . Not on file   Social Determinants of Health   Financial Resource Strain:   . Difficulty of Paying Living Expenses:   Food Insecurity:   . Worried About Programme researcher, broadcasting/film/video in the Last Year:   . Barista in the Last Year:   Transportation Needs:   . Freight forwarder (Medical):   Marland Kitchen Lack of Transportation (Non-Medical):   Physical Activity:   . Days of Exercise per Week:   . Minutes of Exercise per Session:   Stress:   . Feeling of Stress :   Social Connections:   . Frequency of Communication with Friends and Family:   . Frequency of Social Gatherings with Friends and Family:   . Attends Religious Services:   . Active Member of Clubs or Organizations:   . Attends Banker Meetings:   Marland Kitchen Marital Status:   Intimate Partner Violence:   . Fear of Current or Ex-Partner:   . Emotionally Abused:   Marland Kitchen Physically Abused:   . Sexually Abused:       Review of Systems  Constitutional: Positive for fatigue. Negative for chills and unexpected weight change.  HENT: Negative for congestion, rhinorrhea, sneezing and sore throat.   Respiratory: Positive for shortness of breath and wheezing. Negative for cough and chest tightness.        Intermittent.  Having to use her rescue inhaler more often than normal.   Cardiovascular: Positive for leg swelling. Negative for chest pain and palpitations.  Gastrointestinal: Negative for abdominal pain, constipation, diarrhea, nausea and vomiting.  Endocrine: Negative for cold intolerance, heat intolerance, polydipsia and polyuria.  Musculoskeletal: Negative for arthralgias, back pain, joint swelling and neck pain.       Left lower leg swelling and pain. Improved since her last visit.   Skin: Negative for rash.  Allergic/Immunologic: Positive for environmental allergies.  Neurological: Negative for tremors and numbness.  Hematological: Negative for adenopathy. Does not bruise/bleed easily.  Psychiatric/Behavioral: Negative for behavioral problems and sleep disturbance. The patient is not nervous/anxious.     Today's Vitals   09/14/19 1407  BP: (!) 138/59  Pulse: 66  Resp: 16  Temp: (!) 97.2 F (36.2 C)  SpO2: 95%  Weight: 203 lb 9.6 oz (92.4 kg)  Height: 5\' 2"  (1.575 m)   Body mass index is 37.24 kg/m.   Physical Exam Vitals and nursing note reviewed.  Constitutional:      General: She is not in acute distress.    Appearance: Normal appearance. She is well-developed. She is not diaphoretic.  HENT:     Head: Normocephalic and atraumatic.     Nose: Nose normal.     Mouth/Throat:     Pharynx: No oropharyngeal exudate.  Eyes:     Conjunctiva/sclera: Conjunctivae normal.     Pupils: Pupils are equal, round, and reactive to light.  Neck:     Thyroid: No thyromegaly.     Vascular: No carotid bruit or JVD.     Trachea: No tracheal deviation.  Cardiovascular:     Rate and Rhythm: Normal rate and regular rhythm.     Heart sounds: Normal heart sounds. No murmur. No friction rub. No gallop.   Pulmonary:     Effort: Pulmonary effort is normal. No respiratory distress.     Breath sounds: Normal breath sounds. No wheezing or rales.     Comments: Shortness of breath with mild exertion.   Chest:     Chest wall: No tenderness.  Abdominal:     General: Bowel sounds are normal.     Palpations: Abdomen is soft.     Tenderness: There is no abdominal tenderness.  Musculoskeletal:        General: Normal range of motion.     Cervical back: Normal range of motion and neck supple.  Lymphadenopathy:     Cervical: No cervical adenopathy.  Skin:    General: Skin is warm and dry.  Neurological:     General: No focal deficit present.     Mental Status: She is alert and oriented to person, place, and time.     Cranial Nerves: No cranial nerve deficit.  Psychiatric:        Mood and Affect: Mood normal.        Behavior: Behavior normal.        Thought Content: Thought content normal.        Judgment: Judgment normal.    Assessment/Plan: 1. COPD, mild ([place]) Reviewed results of PFT with the patient showing mild restrictive lung disease with normal DLCO.  Add Anoro - one inhalation daily. Continue to use rescue inhaler as needed and as prescribed.  - umeclidinium-vilanterol (ANORO ELLIPTA) 62.5-25 MCG/INH AEPB; Inhale 1 puff into the lungs daily.  Dispense: 1 each; Refill: 3  2. Lower extremity edema Reviewed results of arterial duplex study which were normal. Will continue to monitor closely.   3. Essential hypertension Stable. Continue bp medication as prescribed   4. Coronary artery disease of native artery of native heart with stable angina pectoris (HCC) Stable. Continue regular visits with cardiology as scheduled.   5. Generalized anxiety disorder Continue to use buspirone as needed and as prescribed    General Counseling: Harshitha verbalizes understanding of the findings of todays visit and agrees with plan of treatment. I have discussed any further diagnostic evaluation that may be needed or ordered today. We also reviewed her medications today. she has been encouraged to call the office with  any questions or concerns that should arise related to todays visit.  This  patient was seen by Vincent Gros FNP Collaboration with Dr Lyndon Code as a part of collaborative care agreement   Meds ordered this encounter  Medications  . umeclidinium-vilanterol (ANORO ELLIPTA) 62.5-25 MCG/INH AEPB    Sig: Inhale 1 puff into the lungs daily.    Dispense:  1 each    Refill:  3    Samples and copay assistance card provided for patient today.    Order Specific Question:   Supervising Provider    Answer:   Lyndon Code [[id number]]    Total time spent: 30 Minutes   Time spent includes review of chart, medications, test results, and follow up plan with the patient.      Dr Lyndon Code Internal medicine

## 2019-09-20 ENCOUNTER — Encounter: Payer: Self-pay | Admitting: Internal Medicine

## 2019-09-20 ENCOUNTER — Other Ambulatory Visit: Payer: Self-pay

## 2019-09-20 ENCOUNTER — Ambulatory Visit (INDEPENDENT_AMBULATORY_CARE_PROVIDER_SITE_OTHER): Payer: 59 | Admitting: Internal Medicine

## 2019-09-20 VITALS — BP 120/70 | HR 66 | Temp 97.1°F | Ht 62.0 in | Wt 204.4 lb

## 2019-09-20 DIAGNOSIS — E785 Hyperlipidemia, unspecified: Secondary | ICD-10-CM

## 2019-09-20 DIAGNOSIS — I25118 Atherosclerotic heart disease of native coronary artery with other forms of angina pectoris: Secondary | ICD-10-CM | POA: Diagnosis not present

## 2019-09-20 DIAGNOSIS — I10 Essential (primary) hypertension: Secondary | ICD-10-CM

## 2019-09-20 NOTE — Progress Notes (Signed)
Follow-up Outpatient Visit Date: 09/20/2019  Primary Care Provider: Carlean Jews, NP [address] [place] [address]  Chief Complaint: Follow-up stable angina  HPI:  Sherry Torres is a 59 y.o. female with history of coronaryartery disease with high-grade disease involving distal LCx and nondominant RCA, hypertension, hyperlipidemia, diabetes mellitus, and anxiety, who presents for follow-up of stable angina.  I last saw Ms. Sherry Torres in November, at which time her chest pain was well-controlled.  She decreased ranolazine to 1000 mg daily due to leg swelling and noted some improvement.  I provided her with a prescription for furosemide 20 mg daily as needed for leg edema.  We ultimately switched from ranolazine to amlodipine due to cost issues.  Today, Ms. Sherry Torres reports that she has been doing well.  She still has intermittent leg edema and is using as needed furosemide most days of the week.  This seems to control the swelling.  She is trying to minimize her sodium intake.  She has not had any significant chest pain, shortness of breath, palpitations, or lightheadedness.  Her home blood pressures are usually well controlled, similar to today's office reading.  She has restarted Metformin due to increase in hemoglobin A1c on last check.  --------------------------------------------------------------------------------------------------  Past Medical History:  Diagnosis Date  . Anxiety   . Asthma   . CAD (coronary artery disease)    a. 12/2017 NSTEMI;  b. 12/2017 MV: basal and mid inflat ischemia; c. 12/2017 Cath: LM nl, LAD 30ost, D1 mild dzs, LCX 40ost, 95d, RCA 30m (nondominant). LCX felt to be culprit but only 1.76mm vessel-->Med rx.  . Diastolic dysfunction    a. 12/2017 Echo: EF 55-60%, no rwma, Gr1 DD; b. 01/2018 Echo: EF 60-65%, gr2 DD, no rwma, mild MR, nl RV fxn.  . Hyperlipidemia LDL goal <70   . Hypertension   . Recurrent major depressive disorder, in partial remission  (HCC) 09/17/2017  . Tobacco abuse   . Type 2 diabetes mellitus (HCC)    Past Surgical History:  Procedure Laterality Date  . CARDIAC CATHETERIZATION    . LEFT HEART CATH AND CORONARY ANGIOGRAPHY N/A 01/06/2018   Procedure: LEFT HEART CATH AND CORONARY ANGIOGRAPHY;  Surgeon: Iran Ouch, MD;  Location: [facility] INVASIVE CV LAB;  Service: Cardiovascular;  Laterality: N/A;    Current Meds  Medication Sig  . amLODipine (NORVASC) 2.5 MG tablet Take 2.5 mg by mouth daily.  Marland Kitchen aspirin 81 MG chewable tablet Chew 1 tablet (81 mg total) by mouth daily.  Marland Kitchen atorvastatin (LIPITOR) 80 MG tablet Take 1 tablet (80 mg total) by mouth daily at 6 PM.  . bisoprolol (ZEBETA) 5 MG tablet Take 1 tablet (5 mg total) by mouth daily.  Marland Kitchen buPROPion (ZYBAN) 150 MG 12 hr tablet Take 1 tablet (150 mg total) by mouth 2 (two) times daily.  . clopidogrel (PLAVIX) 75 MG tablet Take 1 tablet (75 mg total) by mouth daily with breakfast.  . cyclobenzaprine (FLEXERIL) 5 MG tablet Take 1 tablet (5 mg total) by mouth 3 (three) times daily as needed for muscle spasms.  . diclofenac sodium (VOLTAREN) 1 % GEL Apply 4 g topically 4 (four) times daily.  . fluticasone (FLONASE) 50 MCG/ACT nasal spray Place 2 sprays into both nostrils daily.  . furosemide (LASIX) 20 MG tablet PLEASE SEE ATTACHED FOR DETAILED DIRECTIONS (Patient taking differently: 1 tablet PO PRN)  . hydrochlorothiazide (HYDRODIURIL) 25 MG tablet Take 1 tablet (25 mg total) by mouth daily.  . isosorbide mononitrate (  IMDUR) 60 MG 24 hr tablet Take 1.5 tablets (90 mg total) by mouth 2 (two) times daily.  . metFORMIN (GLUCOPHAGE) 500 MG tablet Take 1 tablet (500 mg total) by mouth daily with breakfast.  . nitroGLYCERIN (NITROSTAT) 0.4 MG SL tablet Place 1 tablet (0.4 mg total) under the tongue every 5 (five) minutes as needed for chest pain.  Marland Kitchen olmesartan (BENICAR) 5 MG tablet TAKE 1 TABLET BY MOUTH EVERY DAY  . umeclidinium-vilanterol (ANORO ELLIPTA) 62.5-25 MCG/INH AEPB  Inhale 1 puff into the lungs daily.  . VENTOLIN HFA 108 (90 Base) MCG/ACT inhaler Inhale 1-2 puffs into the lungs daily.    Allergies: Codeine  Social History   Tobacco Use  . Smoking status: Former Smoker    Types: Cigarettes  . Smokeless tobacco: Never Used  Substance Use Topics  . Alcohol use: No  . Drug use: No    Family History  Problem Relation Age of Onset  . Cancer Mother   . Breast cancer Mother 33  . Hyperlipidemia Father     Review of Systems: A 12-system review of systems was performed and was negative except as noted in the HPI.  --------------------------------------------------------------------------------------------------  Physical Exam: BP 120/70 (BP Location: Left Arm, Patient Position: Sitting, Cuff Size: Normal)   Pulse 66   Temp (!) 97.1 F (36.2 C)   Ht 5\' 2"  (1.575 m)   Wt 204 lb 6 oz (92.7 kg)   SpO2 96%   BMI 37.38 kg/m   General: NAD. Neck: No JVD or HJR. Lungs: Clear to auscultation bilaterally without wheezes or crackles. Heart: Distant heart sounds.  Regular rate and rhythm without murmurs. Abdomen: Soft, nontender, nondistended. Extremities: No lower extremity edema.  EKG: Normal sinus rhythm without abnormality.  Lab Results  Component Value Date   WBC 6.4 03/22/2019   HGB 11.7 03/22/2019   HCT 35.1 03/22/2019   MCV 93 03/22/2019   PLT 228 03/22/2019    Lab Results  Component Value Date   NA 139 03/22/2019   K 4.2 03/22/2019   CL 102 03/22/2019   CO2 25 03/22/2019   BUN 13 03/22/2019   CREATININE 0.88 03/22/2019   GLUCOSE 182 (H) 03/22/2019   ALT 14 03/22/2019    Lab Results  Component Value Date   CHOL 128 03/22/2019   HDL 54 03/22/2019   LDLCALC 54 03/22/2019   TRIG 108 03/22/2019   CHOLHDL 2.4 03/22/2019   --------------------------------------------------------------------------------------------------  ASSESSMENT AND PLAN: Coronary artery disease with stable angina: [name] is doing well  without further chest pain or shortness of breath.  We have agreed to continue with her current antianginal therapy consisting of amlodipine, bisoprolol, and isosorbide mononitrate.  I encouraged her to work on exercise as well as weight loss, which will hopefully help improve her blood sugars as well, given recent rise in hemoglobin A1c.  Hypertension: Blood pressure well controlled.  Continue current medications.  Hyperlipidemia: LDL at goal on last check in 03/2019 (less than 70): Continue atorvastatin 80 mg daily.  Follow-up: Return to clinic in 6 months.  Nelva Bush, MD 09/20/2019 8:30 AM

## 2019-09-20 NOTE — Patient Instructions (Signed)

## 2019-09-27 DIAGNOSIS — J449 Chronic obstructive pulmonary disease, unspecified: Secondary | ICD-10-CM | POA: Insufficient documentation

## 2019-10-23 ENCOUNTER — Other Ambulatory Visit: Payer: Self-pay

## 2019-10-23 DIAGNOSIS — J4520 Mild intermittent asthma, uncomplicated: Secondary | ICD-10-CM

## 2019-10-23 MED ORDER — VENTOLIN HFA 108 (90 BASE) MCG/ACT IN AERS: 1.0000 | INHALATION_SPRAY | Freq: Every day | RESPIRATORY_TRACT | 3 refills | Status: DC

## 2019-10-26 ENCOUNTER — Ambulatory Visit: Payer: 59 | Admitting: Nurse Practitioner

## 2019-10-28 ENCOUNTER — Other Ambulatory Visit: Payer: Self-pay | Admitting: Internal Medicine

## 2019-10-28 DIAGNOSIS — I10 Essential (primary) hypertension: Secondary | ICD-10-CM

## 2020-01-13 ENCOUNTER — Other Ambulatory Visit: Payer: Self-pay | Admitting: Internal Medicine

## 2020-01-27 ENCOUNTER — Other Ambulatory Visit: Payer: Self-pay | Admitting: Internal Medicine

## 2020-01-27 DIAGNOSIS — I10 Essential (primary) hypertension: Secondary | ICD-10-CM

## 2020-02-28 ENCOUNTER — Ambulatory Visit: Payer: 59 | Admitting: Hospice and Palliative Medicine

## 2020-02-28 ENCOUNTER — Other Ambulatory Visit: Payer: Self-pay

## 2020-02-28 ENCOUNTER — Encounter: Payer: Self-pay | Admitting: Hospice and Palliative Medicine

## 2020-02-28 VITALS — BP 124/62 | HR 73 | Temp 97.1°F | Resp 16 | Ht 62.0 in | Wt 195.2 lb

## 2020-02-28 DIAGNOSIS — N3001 Acute cystitis with hematuria: Secondary | ICD-10-CM

## 2020-02-28 DIAGNOSIS — J4530 Mild persistent asthma, uncomplicated: Secondary | ICD-10-CM

## 2020-02-28 DIAGNOSIS — R300 Dysuria: Secondary | ICD-10-CM

## 2020-02-28 DIAGNOSIS — E1165 Type 2 diabetes mellitus with hyperglycemia: Secondary | ICD-10-CM

## 2020-02-28 DIAGNOSIS — J3089 Other allergic rhinitis: Secondary | ICD-10-CM | POA: Diagnosis not present

## 2020-02-28 DIAGNOSIS — J449 Chronic obstructive pulmonary disease, unspecified: Secondary | ICD-10-CM

## 2020-02-28 DIAGNOSIS — J45909 Unspecified asthma, uncomplicated: Secondary | ICD-10-CM

## 2020-02-28 LAB — POCT URINALYSIS DIPSTICK
Bilirubin, UA: NEGATIVE
Glucose, UA: NEGATIVE
Nitrite, UA: NEGATIVE
Protein, UA: POSITIVE — AB
Spec Grav, UA: 1.02 (ref 1.010–1.025)
Urobilinogen, UA: NEGATIVE E.U./dL — AB
pH, UA: 6 (ref 5.0–8.0)

## 2020-02-28 LAB — POCT GLYCOSYLATED HEMOGLOBIN (HGB A1C): Hemoglobin A1C: 6.1 % — AB (ref 4.0–5.6)

## 2020-02-28 MED ORDER — BREO ELLIPTA 100-25 MCG/INH IN AEPB: 1.0000 | INHALATION_SPRAY | Freq: Every day | RESPIRATORY_TRACT | 0 refills | Status: DC

## 2020-02-28 MED ORDER — NITROFURANTOIN MONOHYD MACRO 100 MG PO CAPS: 100.0000 mg | ORAL_CAPSULE | Freq: Two times a day (BID) | ORAL | 0 refills | Status: DC

## 2020-02-28 NOTE — Progress Notes (Signed)
[facility] [address] [place], [place] [address]  Internal MEDICINE  Office Visit Note  Patient Name: Sherry Torres  [id number]  [id number]  Date of Service: 03/01/2020  Chief Complaint  Patient presents with  . Follow-up    pressure/urgency when urinating but voids very little, urinary frequency, burns at the end, dyscomfort, sometimes has back pain  . Anxiety  . Depression  . Hypertension  . Hyperlipidemia  . controled policy update  . Quality Metric Gaps    mammogram, covid    HPI Patient is here for routine follow-up DM--sugars have been well controlled at home, no issues with her medications She is still struggling with some shortness of breath, does not feel that Anoro is helping her--symptoms today are allergy in nature Has rhinorrhea and sneezing, has these symptoms with each season change, shortness of breath is more with exertion, longer distances, easily recovers with a minute of rest She complains today of urinary frequency and burning with urination for about PFT from April--mild obstructive lung disease  Current Medication: Outpatient Encounter Medications as of 02/28/2020  Medication Sig  . aspirin 81 MG chewable tablet Chew 1 tablet (81 mg total) by mouth daily.  Marland Kitchen atorvastatin (LIPITOR) 80 MG tablet Take 1 tablet (80 mg total) by mouth daily at 6 PM.  . bisoprolol (ZEBETA) 5 MG tablet Take 1 tablet (5 mg total) by mouth daily.  Marland Kitchen buPROPion (ZYBAN) 150 MG 12 hr tablet Take 1 tablet (150 mg total) by mouth 2 (two) times daily.  . clopidogrel (PLAVIX) 75 MG tablet Take 1 tablet (75 mg total) by mouth daily with breakfast.  . cyclobenzaprine (FLEXERIL) 5 MG tablet Take 1 tablet (5 mg total) by mouth 3 (three) times daily as needed for muscle spasms.  . diclofenac sodium (VOLTAREN) 1 % GEL Apply 4 g topically 4 (four) times daily.  . fluticasone (FLONASE) 50 MCG/ACT nasal spray Place 2 sprays into both nostrils daily.  . furosemide (LASIX) 20  MG tablet 1 tablet PO QD PRN  . hydrochlorothiazide (HYDRODIURIL) 25 MG tablet Take 1 tablet (25 mg total) by mouth daily.  . isosorbide mononitrate (IMDUR) 60 MG 24 hr tablet TAKE 1 AND 1/2 TABLETS BY MOUTH TWICE A DAY  . metFORMIN (GLUCOPHAGE) 500 MG tablet Take 1 tablet (500 mg total) by mouth daily with breakfast.  . nitroGLYCERIN (NITROSTAT) 0.4 MG SL tablet Place 1 tablet (0.4 mg total) under the tongue every 5 (five) minutes as needed for chest pain.  Marland Kitchen olmesartan (BENICAR) 5 MG tablet TAKE 1 TABLET BY MOUTH EVERY DAY  . umeclidinium-vilanterol (ANORO ELLIPTA) 62.5-25 MCG/INH AEPB Inhale 1 puff into the lungs daily.  . VENTOLIN HFA 108 (90 Base) MCG/ACT inhaler Inhale 1-2 puffs into the lungs daily.  . fluticasone furoate-vilanterol (BREO ELLIPTA) 100-25 MCG/INH AEPB Inhale 1 puff into the lungs daily.  . nitrofurantoin, macrocrystal-monohydrate, (MACROBID) 100 MG capsule Take 1 capsule (100 mg total) by mouth 2 (two) times daily.  . [DISCONTINUED] amLODipine (NORVASC) 2.5 MG tablet Take 2.5 mg by mouth daily. (Patient not taking: Reported on 02/28/2020)   No facility-administered encounter medications on file as of 02/28/2020.    Surgical History: Past Surgical History:  Procedure Laterality Date  . CARDIAC CATHETERIZATION    . LEFT HEART CATH AND CORONARY ANGIOGRAPHY N/A 01/06/2018   Procedure: LEFT HEART CATH AND CORONARY ANGIOGRAPHY;  Surgeon: Iran Ouch, MD;  Location: [facility] INVASIVE CV LAB;  Service: Cardiovascular;  Laterality: N/A;    Medical  History: Past Medical History:  Diagnosis Date  . Anxiety   . Asthma   . CAD (coronary artery disease)    a. 12/2017 NSTEMI;  b. 12/2017 MV: basal and mid inflat ischemia; c. 12/2017 Cath: LM nl, LAD 30ost, D1 mild dzs, LCX 40ost, 95d, RCA 37m (nondominant). LCX felt to be culprit but only 1.52mm vessel-->Med rx.  . Diastolic dysfunction    a. 12/2017 Echo: EF 55-60%, no rwma, Gr1 DD; b. 01/2018 Echo: EF 60-65%, gr2 DD, no rwma, mild  MR, nl RV fxn.  . Hyperlipidemia LDL goal <70   . Hypertension   . Recurrent major depressive disorder, in partial remission (HCC) 09/17/2017  . Tobacco abuse   . Type 2 diabetes mellitus (HCC)     Family History: Family History  Problem Relation Age of Onset  . Cancer Mother   . Breast cancer Mother 46  . Hyperlipidemia Father     Social History   Socioeconomic History  . Marital status: Married    Spouse name: Not on file  . Number of children: Not on file  . Years of education: Not on file  . Highest education level: Not on file  Occupational History  . Not on file  Tobacco Use  . Smoking status: Former Smoker    Types: Cigarettes  . Smokeless tobacco: Never Used  Vaping Use  . Vaping Use: Former  Substance and Sexual Activity  . Alcohol use: No  . Drug use: No  . Sexual activity: Not on file  Other Topics Concern  . Not on file  Social History Narrative  . Not on file   Social Determinants of Health   Financial Resource Strain:   . Difficulty of Paying Living Expenses: Not on file  Food Insecurity:   . Worried About Programme researcher, broadcasting/film/video in the Last Year: Not on file  . Ran Out of Food in the Last Year: Not on file  Transportation Needs:   . Lack of Transportation (Medical): Not on file  . Lack of Transportation (Non-Medical): Not on file  Physical Activity:   . Days of Exercise per Week: Not on file  . Minutes of Exercise per Session: Not on file  Stress:   . Feeling of Stress : Not on file  Social Connections:   . Frequency of Communication with Friends and Family: Not on file  . Frequency of Social Gatherings with Friends and Family: Not on file  . Attends Religious Services: Not on file  . Active Member of Clubs or Organizations: Not on file  . Attends Banker Meetings: Not on file  . Marital Status: Not on file  Intimate Partner Violence:   . Fear of Current or Ex-Partner: Not on file  . Emotionally Abused: Not on file  . Physically  Abused: Not on file  . Sexually Abused: Not on file    Review of Systems  Constitutional: Negative for chills, diaphoresis and fatigue.  HENT: Positive for postnasal drip, rhinorrhea and sneezing. Negative for ear pain and sinus pressure.   Eyes: Negative for photophobia, discharge, redness, itching and visual disturbance.  Respiratory: Positive for shortness of breath. Negative for cough and wheezing.   Cardiovascular: Negative for chest pain, palpitations and leg swelling.  Gastrointestinal: Negative for abdominal pain, constipation, diarrhea, nausea and vomiting.  Genitourinary: Positive for dysuria, flank pain and frequency.  Musculoskeletal: Negative for arthralgias, back pain, gait problem and neck pain.  Skin: Negative for color change.  Allergic/Immunologic: Negative for  environmental allergies and food allergies.  Neurological: Negative for dizziness and headaches.  Hematological: Does not bruise/bleed easily.  Psychiatric/Behavioral: Negative for agitation, behavioral problems (depression) and hallucinations.    Vital Signs: BP 124/62   Pulse 73   Temp (!) 97.1 F (36.2 C)   Resp 16   Ht 5\' 2"  (1.575 m)   Wt 195 lb 3.2 oz (88.5 kg)   SpO2 98%   BMI 35.70 kg/m    Physical Exam Constitutional:      Appearance: Normal appearance.  HENT:     Nose: Rhinorrhea present.     Mouth/Throat:     Mouth: Mucous membranes are moist.     Pharynx: Oropharynx is clear.  Cardiovascular:     Rate and Rhythm: Normal rate and regular rhythm.     Pulses: Normal pulses.     Heart sounds: Normal heart sounds.  Pulmonary:     Effort: Pulmonary effort is normal.     Breath sounds: Normal breath sounds.  Musculoskeletal:        General: Normal range of motion.  Skin:    General: Skin is warm.  Neurological:     General: No focal deficit present.     Mental Status: She is alert and oriented to person, place, and time. Mental status is at baseline.  Psychiatric:        Mood and  Affect: Mood normal.        Behavior: Behavior normal.        Thought Content: Thought content normal.    Assessment/Plan: 1. Uncontrolled type 2 diabetes mellitus with hyperglycemia (HCC) A1C 6.1, continues to be well controlled on Metformin Continue with therapy and monitoring Encouraged to keep focusing on healthy eating habits as well as daily exercise as tolerated - POCT HgB A1C  2. Environmental and seasonal allergies Due to symptom presentation of recurrence of symptoms, will schedule for in clinic allergy testing and adjust plan of care as needed - Allergy Panel, Region 2, [place]  3. COPD, mild (HCC) Stop Anoro as symptoms are not being well controlled, will try Breo Advised to use albuterol inhaler on as needed basis for shortness of breath - fluticasone furoate-vilanterol (BREO ELLIPTA) 100-25 MCG/INH AEPB; Inhale 1 puff into the lungs daily.  Dispense: 60 each; Refill: 0  4. Dysuria - CULTURE, URINE COMPREHENSIVE - POCT Urinalysis Dipstick  5. Acute cystitis with hematuria Will treat symptoms with Macrobid, await culture results and adjust therapy as needed - nitrofurantoin, macrocrystal-monohydrate, (MACROBID) 100 MG capsule; Take 1 capsule (100 mg total) by mouth 2 (two) times daily.  Dispense: 10 capsule; Refill: 0  General Counseling: Rosaly verbalizes understanding of the findings of todays visit and agrees with plan of treatment. I have discussed any further diagnostic evaluation that may be needed or ordered today. We also reviewed her medications today. she has been encouraged to call the office with any questions or concerns that should arise related to todays visit.    Orders Placed This Encounter  Procedures  . Allergy Test  . CULTURE, URINE COMPREHENSIVE  . Allergy Panel, Region 2, [place]  . POCT HgB A1C  . POCT Urinalysis Dipstick    Meds ordered this encounter  Medications  . fluticasone furoate-vilanterol (BREO ELLIPTA) 100-25 MCG/INH AEPB     Sig: Inhale 1 puff into the lungs daily.    Dispense:  60 each    Refill:  0  . nitrofurantoin, macrocrystal-monohydrate, (MACROBID) 100 MG capsule    Sig: Take 1 capsule (  100 mg total) by mouth 2 (two) times daily.    Dispense:  10 capsule    Refill:  0    Time spent: 30 Minutes Time spent includes review of chart, medications, test results and follow-up plan with the patient.  This patient was seen by Leeanne Deedaylor Raliegh Scobie AGNP-C in Collaboration with Dr Lyndon CodeFozia M Khan as a part of collaborative care agreement     Lubertha Basqueaylor S. Walther Sanagustin AGNP-C Internal medicine

## 2020-03-01 ENCOUNTER — Encounter: Payer: Self-pay | Admitting: Hospice and Palliative Medicine

## 2020-03-02 LAB — CULTURE, URINE COMPREHENSIVE

## 2020-03-12 ENCOUNTER — Telehealth: Payer: Self-pay

## 2020-03-13 ENCOUNTER — Other Ambulatory Visit: Payer: Self-pay | Admitting: Hospice and Palliative Medicine

## 2020-03-13 MED ORDER — SULFAMETHOXAZOLE-TRIMETHOPRIM 800-160 MG PO TABS: 1.0000 | ORAL_TABLET | Freq: Two times a day (BID) | ORAL | 0 refills | Status: DC

## 2020-03-13 NOTE — Progress Notes (Signed)
Sent in Bactrim for 7 days for recurring symptoms of UTI

## 2020-03-13 NOTE — Telephone Encounter (Signed)
As per Sherry Torres advised we send bactrim

## 2020-03-18 ENCOUNTER — Telehealth: Payer: Self-pay

## 2020-03-18 IMAGING — CT CT ANGIO CHEST
2 of 6 series · 19 of 46 positions shown · IV contrast (APPLIED)
Comparison: Chest x-ray 01/04/2018

CLINICAL DATA: Centralized chest pain

EXAM:
CT ANGIOGRAPHY CHEST WITH CONTRAST
TECHNIQUE: Multidetector CT imaging of the chest was performed using the
standard protocol during bolus administration of intravenous
contrast. Multiplanar CT image reconstructions and MIPs were
obtained to evaluate the vascular anatomy.
CONTRAST:  75mL KX5DNV-I9M IOPAMIDOL (KX5DNV-I9M) INJECTION 76%

[Series 5: thins · axial · 0.63mm/px · z∈[-650,-414]mm · 17 of 260 slices shown]
[im 12/260  lung]
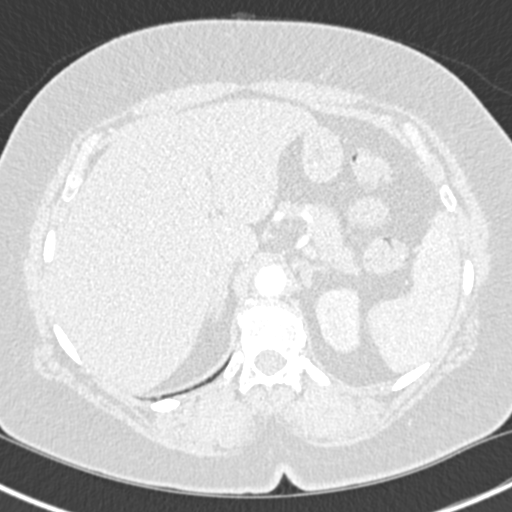
[im 23/260  soft-tissue]
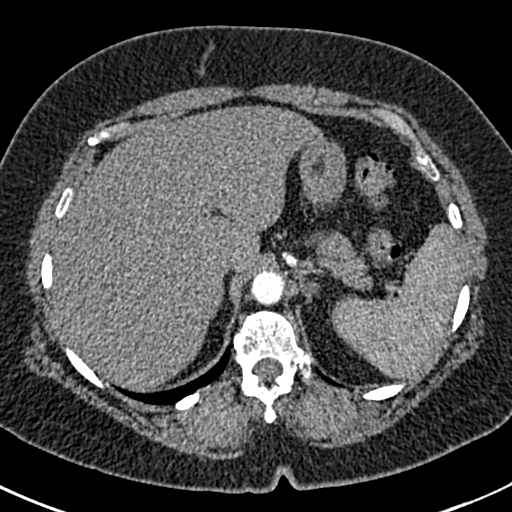
[im 46/260  lung]
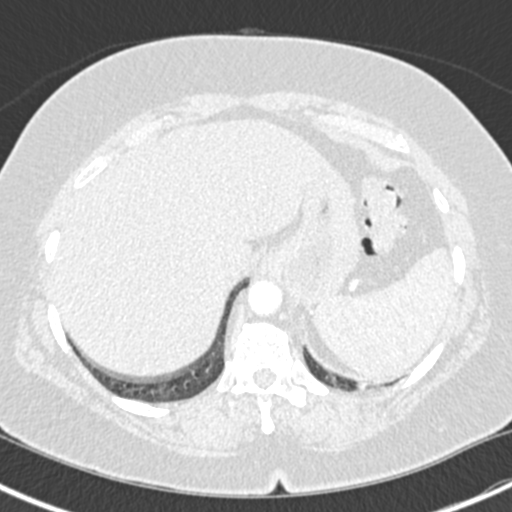
[im 57/260  soft-tissue]
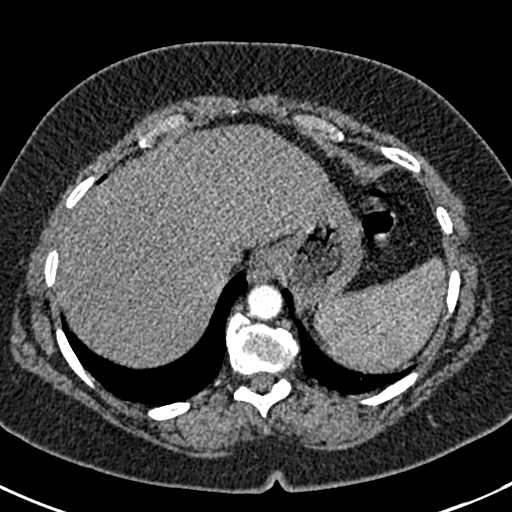
[im 68/260  lung]
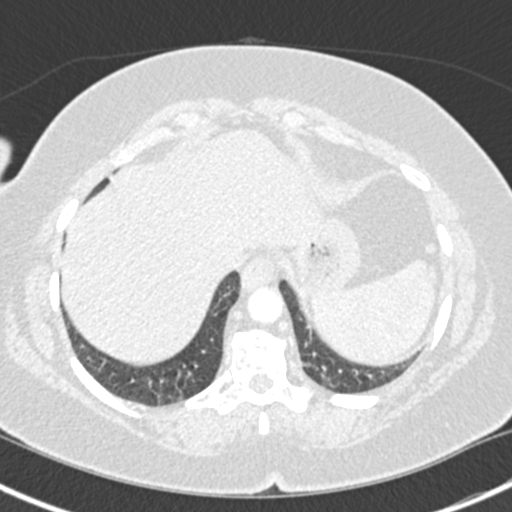
[im 91/260  soft-tissue]
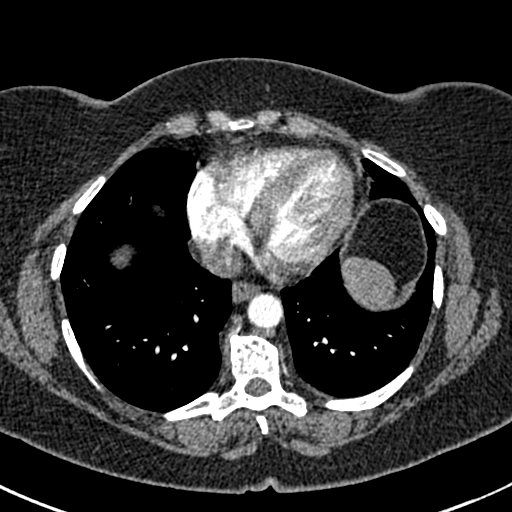
[im 102/260  lung]
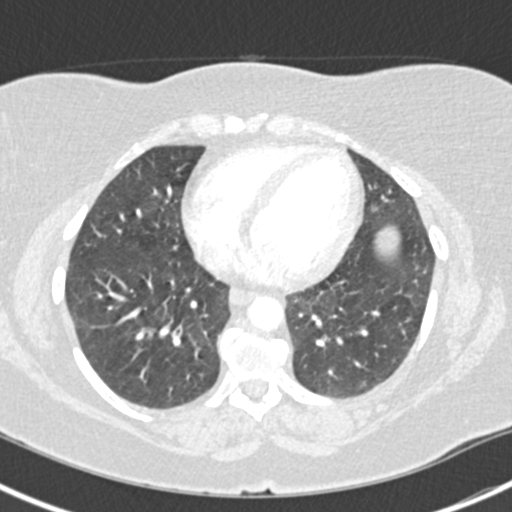
[im 113/260  soft-tissue]
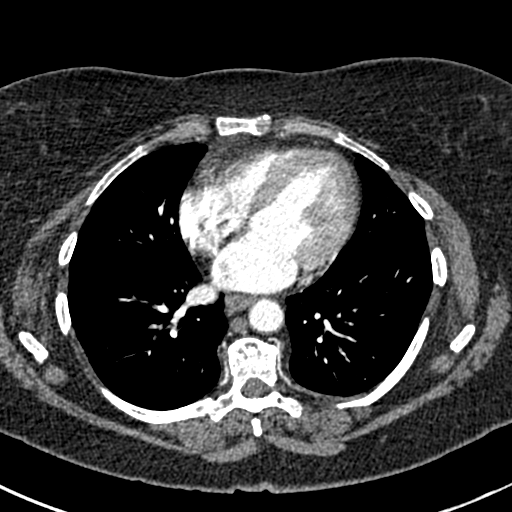
[im 136/260  lung]
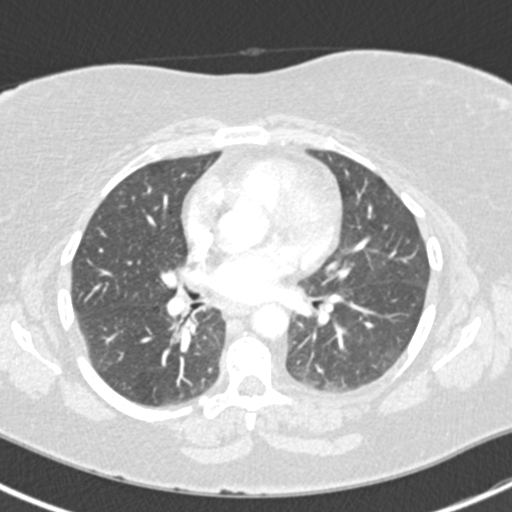
[im 147/260  soft-tissue]
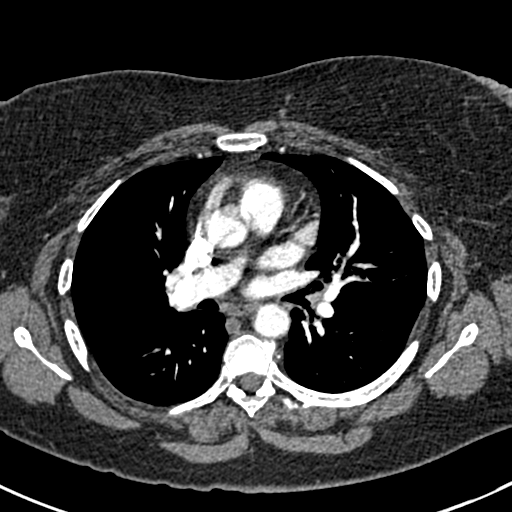
[im 158/260  lung]
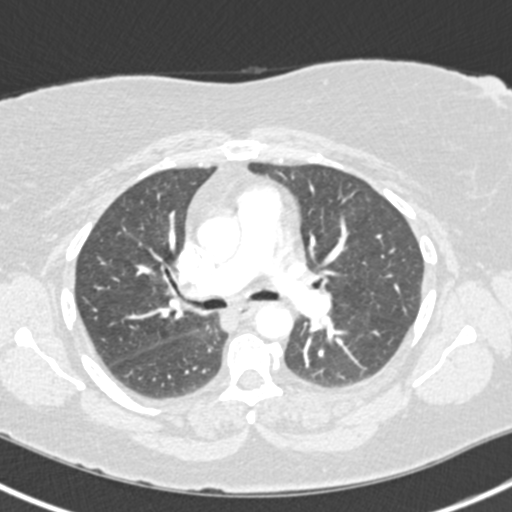
[im 169/260  soft-tissue]
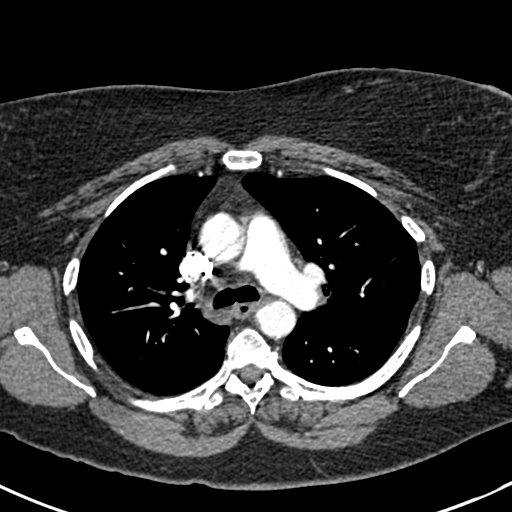
[im 192/260  lung]
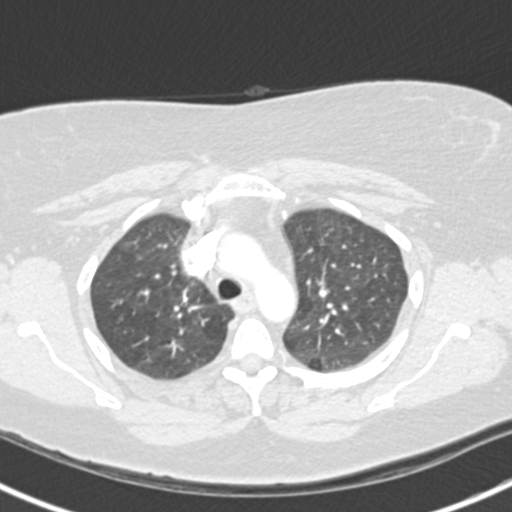
[im 203/260  soft-tissue]
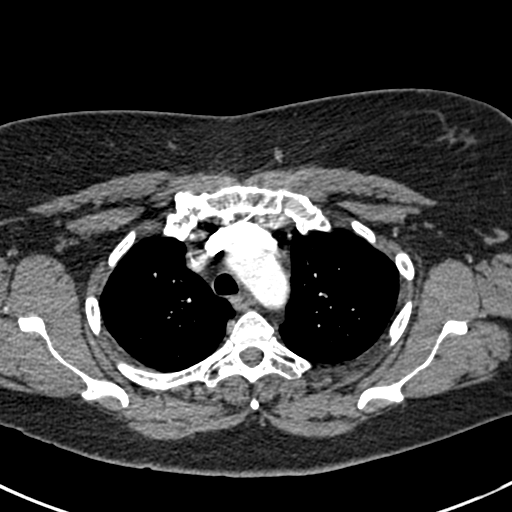
[im 214/260  lung]
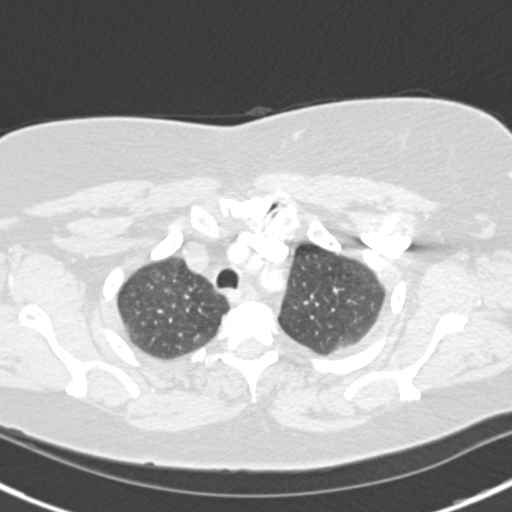
[im 237/260  soft-tissue]
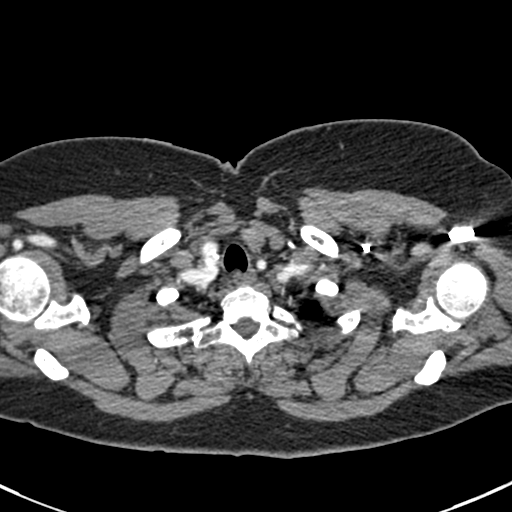
[im 248/260  lung]
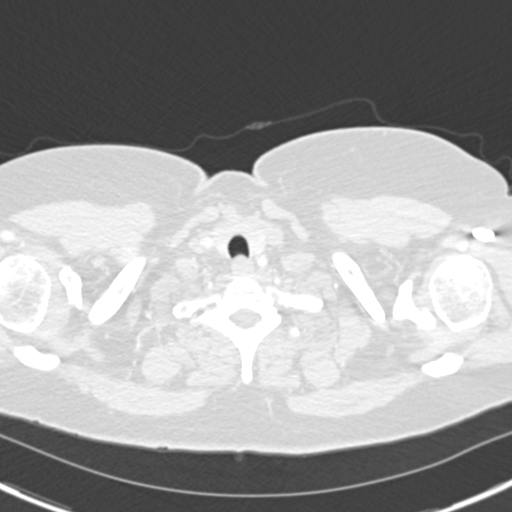

[Series 7: coronal mpr · coronal · 0.55mm/px · 2 of 74 slices shown]
[im 25/74  soft-tissue]
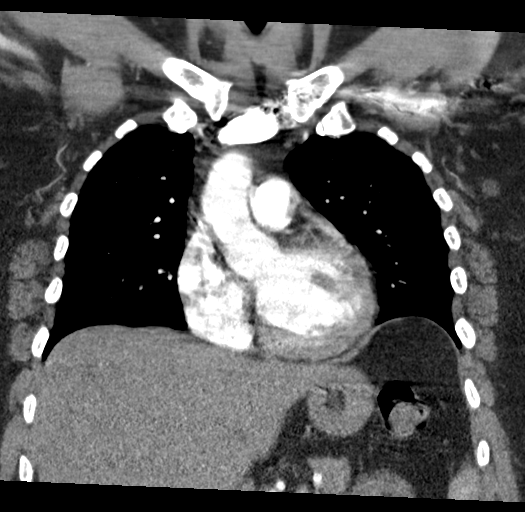
[im 49/74  soft-tissue]
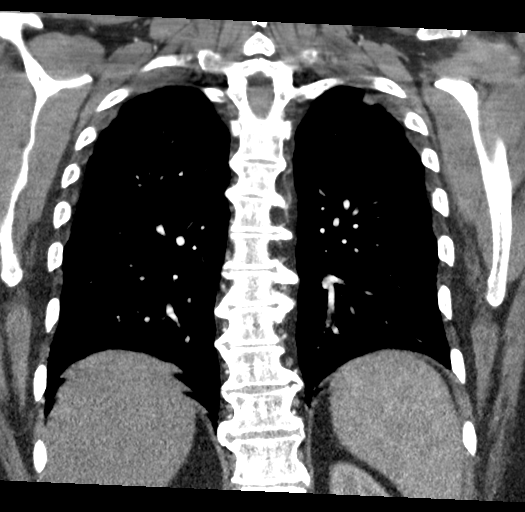

[19 of 46 positions shown; findings below may reference images not displayed]

FINDINGS: Cardiovascular: Satisfactory opacification of the pulmonary arteries
to the segmental level. No evidence of pulmonary embolism. Normal
heart size. No pericardial effusion. Nonaneurysmal aorta. No
dissection seen.

Mediastinum/Nodes: No enlarged mediastinal, hilar, or axillary lymph
nodes. Thyroid gland, trachea, and esophagus demonstrate no
significant findings.

Lungs/Pleura: Lungs are clear. No pleural effusion or pneumothorax.

Upper Abdomen: No acute abnormality.

Musculoskeletal: No chest wall abnormality. No acute or significant
osseous findings.

Review of the MIP images confirms the above findings.
IMPRESSION: Negative for acute pulmonary embolus or aortic dissection. Clear
lung fields.

## 2020-03-18 NOTE — Telephone Encounter (Signed)
Pt called in and cancelled allg injection appt .. due to not being able to leave work. She did not want to RS appt

## 2020-03-21 ENCOUNTER — Ambulatory Visit: Payer: 59 | Admitting: Internal Medicine

## 2020-03-25 NOTE — Progress Notes (Signed)
Follow-up Outpatient Visit Date: 03/27/2020  Primary Care Provider: Carlean Jews, NP [address] [place] [address]  Chief Complaint: Follow-up coronary artery disease  HPI:  Ms. Cahoon is a 59 y.o. female with history of coronaryartery diseasewith high-grade disease involving distal LCx and nondominant RCA, hypertension, hyperlipidemia, diabetes mellitus, and anxiety, who presents for follow-up of coronary artery disease.  I last saw her in May, at which time she was doing well without significant chest pain.  She continued to note intermittent leg edema prompting her to use her PRN furosemide most days.  Today, Ms. Bachmeier reports feeling about the same as at our last visit.  She still notes intermittent fatigue and vague chest discomfort with extended walking.  She does not have significant dyspnea at rest or with her activities of daily living.  She also denies palpitations and lightheadedness.  Edema is well controlled with use of furosemide 3 times per week.  Home blood pressures are usually in the 120s to 130s/50s to 60s.  She has been trying to lose weight through dietary changes.  --------------------------------------------------------------------------------------------------  Past Medical History:  Diagnosis Date  . Anxiety   . Asthma   . CAD (coronary artery disease)    a. 12/2017 NSTEMI;  b. 12/2017 MV: basal and mid inflat ischemia; c. 12/2017 Cath: LM nl, LAD 30ost, D1 mild dzs, LCX 40ost, 95d, RCA 72m (nondominant). LCX felt to be culprit but only 1.72mm vessel-->Med rx.  . Diastolic dysfunction    a. 12/2017 Echo: EF 55-60%, no rwma, Gr1 DD; b. 01/2018 Echo: EF 60-65%, gr2 DD, no rwma, mild MR, nl RV fxn.  . Hyperlipidemia LDL goal <70   . Hypertension   . Recurrent major depressive disorder, in partial remission (HCC) 09/17/2017  . Tobacco abuse   . Type 2 diabetes mellitus (HCC)    Past Surgical History:  Procedure Laterality Date  . CARDIAC  CATHETERIZATION    . LEFT HEART CATH AND CORONARY ANGIOGRAPHY N/A 01/06/2018   Procedure: LEFT HEART CATH AND CORONARY ANGIOGRAPHY;  Surgeon: Iran Ouch, MD;  Location: [facility] INVASIVE CV LAB;  Service: Cardiovascular;  Laterality: N/A;    Current Meds  Medication Sig  . aspirin 81 MG chewable tablet Chew 1 tablet (81 mg total) by mouth daily.  Marland Kitchen atorvastatin (LIPITOR) 80 MG tablet Take 1 tablet (80 mg total) by mouth daily at 6 PM.  . bisoprolol (ZEBETA) 5 MG tablet Take 1 tablet (5 mg total) by mouth daily.  . clopidogrel (PLAVIX) 75 MG tablet Take 1 tablet (75 mg total) by mouth daily with breakfast.  . cyclobenzaprine (FLEXERIL) 5 MG tablet Take 1 tablet (5 mg total) by mouth 3 (three) times daily as needed for muscle spasms.  . diclofenac sodium (VOLTAREN) 1 % GEL Apply 4 g topically 4 (four) times daily.  . furosemide (LASIX) 20 MG tablet 1 tablet PO QD PRN  . hydrochlorothiazide (HYDRODIURIL) 25 MG tablet Take 1 tablet (25 mg total) by mouth daily.  . isosorbide mononitrate (IMDUR) 60 MG 24 hr tablet TAKE 1 AND 1/2 TABLETS BY MOUTH TWICE A DAY  . metFORMIN (GLUCOPHAGE) 500 MG tablet Take 1 tablet (500 mg total) by mouth daily with breakfast.  . nitroGLYCERIN (NITROSTAT) 0.4 MG SL tablet Place 1 tablet (0.4 mg total) under the tongue every 5 (five) minutes as needed for chest pain.  Marland Kitchen olmesartan (BENICAR) 5 MG tablet TAKE 1 TABLET BY MOUTH EVERY DAY  . sulfamethoxazole-trimethoprim (BACTRIM DS) 800-160 MG tablet Take  1 tablet by mouth 2 (two) times daily.  Marland Kitchen umeclidinium-vilanterol (ANORO ELLIPTA) 62.5-25 MCG/INH AEPB Inhale 1 puff into the lungs daily.  . VENTOLIN HFA 108 (90 Base) MCG/ACT inhaler Inhale 1-2 puffs into the lungs daily.    Allergies: Codeine  Social History   Tobacco Use  . Smoking status: Former Smoker    Types: Cigarettes  . Smokeless tobacco: Never Used  Vaping Use  . Vaping Use: Former  Substance Use Topics  . Alcohol use: No  . Drug use: No     Family History  Problem Relation Age of Onset  . Cancer Mother   . Breast cancer Mother 59  . Hyperlipidemia Father     Review of Systems: A 12-system review of systems was performed and was negative except as noted in the HPI.  --------------------------------------------------------------------------------------------------  Physical Exam: BP 116/68 (BP Location: Left Arm, Patient Position: Sitting, Cuff Size: Normal)   Pulse 66   Ht 5\' 2"  (1.575 m)   Wt 196 lb (88.9 kg)   SpO2 98%   BMI 35.85 kg/m   General: NAD. Neck: No JVD or HJR. Lungs: Mildly diminished breath sounds throughout without wheezes or crackles. Heart: Regular rate and rhythm without murmurs, rubs, or gallops. Abdomen: Obese, soft.  Nontender/nondistended. Extremities: No lower extremity edema.  EKG: Normal sinus rhythm without abnormality.  Lab Results  Component Value Date   WBC 6.4 03/22/2019   HGB 11.7 03/22/2019   HCT 35.1 03/22/2019   MCV 93 03/22/2019   PLT 228 03/22/2019    Lab Results  Component Value Date   NA 139 03/22/2019   K 4.2 03/22/2019   CL 102 03/22/2019   CO2 25 03/22/2019   BUN 13 03/22/2019   CREATININE 0.88 03/22/2019   GLUCOSE 182 (H) 03/22/2019   ALT 14 03/22/2019    Lab Results  Component Value Date   CHOL 128 03/22/2019   HDL 54 03/22/2019   LDLCALC 54 03/22/2019   TRIG 108 03/22/2019   CHOLHDL 2.4 03/22/2019    --------------------------------------------------------------------------------------------------  ASSESSMENT AND PLAN: Coronary artery disease with stable angina: Overall, Ms. Vonstein feels about the same as at prior visits with some exertional fatigue and vague chest discomfort when she overexerts herself, consistent with CCS class II angina.  We will continue our current antianginal regimen of bisoprolol and isosorbide mononitrate.  Indefinite antiplatelet therapy with clopidogrel will also be continued.  We will check a CBC and CMP  today.  Hypertension: Blood pressure well controlled today.  No medication changes at this time.  We will check a CMP today.  Hyperlipidemia: LDL well controlled.  Continue atorvastatin 80 mg daily.  We will check a CMP and lipid panel today.  Type 2 diabetes mellitus: Continue Metformin; ongoing management per PCP.  Lower extremity edema: Well controlled with as needed furosemide.  Morbid obesity: Ms. Angello is gradually losing weight though BMI remains greater than 35 with multiple comorbidities (CAD, diabetes mellitus, and hypertension).  I congratulated her on her weight loss thus far and have encouraged her to continue working on this through diet and exercise.  Follow-up: Return to clinic in 6 months.  Rodell Perna, MD 03/27/2020 9:23 AM

## 2020-03-27 ENCOUNTER — Encounter: Payer: Self-pay | Admitting: Internal Medicine

## 2020-03-27 ENCOUNTER — Ambulatory Visit (INDEPENDENT_AMBULATORY_CARE_PROVIDER_SITE_OTHER): Payer: 59 | Admitting: Internal Medicine

## 2020-03-27 ENCOUNTER — Other Ambulatory Visit: Payer: Self-pay

## 2020-03-27 VITALS — BP 116/68 | HR 66 | Ht 62.0 in | Wt 196.0 lb

## 2020-03-27 DIAGNOSIS — E1159 Type 2 diabetes mellitus with other circulatory complications: Secondary | ICD-10-CM | POA: Diagnosis not present

## 2020-03-27 DIAGNOSIS — I25118 Atherosclerotic heart disease of native coronary artery with other forms of angina pectoris: Secondary | ICD-10-CM | POA: Diagnosis not present

## 2020-03-27 DIAGNOSIS — R600 Localized edema: Secondary | ICD-10-CM

## 2020-03-27 DIAGNOSIS — E785 Hyperlipidemia, unspecified: Secondary | ICD-10-CM

## 2020-03-27 DIAGNOSIS — I10 Essential (primary) hypertension: Secondary | ICD-10-CM | POA: Diagnosis not present

## 2020-03-27 MED ORDER — BISOPROLOL FUMARATE 5 MG PO TABS: 5.0000 mg | ORAL_TABLET | Freq: Every day | ORAL | 3 refills | Status: DC

## 2020-03-27 MED ORDER — NITROGLYCERIN 0.4 MG SL SUBL: 0.4000 mg | SUBLINGUAL_TABLET | SUBLINGUAL | 2 refills | Status: AC | PRN

## 2020-03-27 MED ORDER — NITROGLYCERIN 0.4 MG SL SUBL: 0.4000 mg | SUBLINGUAL_TABLET | SUBLINGUAL | 2 refills | Status: DC | PRN

## 2020-03-27 MED ORDER — FUROSEMIDE 20 MG PO TABS
ORAL_TABLET | ORAL | 0 refills | Status: DC
Start: 2020-03-27 — End: 2020-12-02

## 2020-03-27 MED ORDER — ISOSORBIDE MONONITRATE ER 60 MG PO TB24: 90.0000 mg | ORAL_TABLET | Freq: Two times a day (BID) | ORAL | 0 refills | Status: DC

## 2020-03-27 MED ORDER — ATORVASTATIN CALCIUM 80 MG PO TABS
80.0000 mg | ORAL_TABLET | Freq: Every day | ORAL | 3 refills | Status: DC
Start: 2020-03-27 — End: 2021-03-25

## 2020-03-27 MED ORDER — CLOPIDOGREL BISULFATE 75 MG PO TABS
75.0000 mg | ORAL_TABLET | Freq: Every day | ORAL | 3 refills | Status: DC
Start: 2020-03-27 — End: 2021-03-25

## 2020-03-27 MED ORDER — OLMESARTAN MEDOXOMIL 5 MG PO TABS: 5.0000 mg | ORAL_TABLET | Freq: Every day | ORAL | 3 refills | Status: DC

## 2020-03-27 NOTE — Patient Instructions (Signed)
Medication Instructions:  Your physician recommends that you continue on your current medications as directed. Please refer to the Current Medication list given to you today.  *If you need a refill on your cardiac medications before your next appointment, please call your pharmacy*   Lab Work:  Your physician recommends that you have lab work TODAY:  Cmet, CBC, Lipid panel  If you have labs (blood work) drawn today and your tests are completely normal, you will receive your results only by: Marland Kitchen MyChart Message (if you have MyChart) OR . A paper copy in the mail If you have any lab test that is abnormal or we need to change your treatment, we will call you to review the results.   Testing/Procedures: None ordered   Follow-Up: At [facility], you and your health needs are our priority.  As part of our continuing mission to provide you with exceptional heart care, we have created designated Provider Care Teams.  These Care Teams include your primary Cardiologist (physician) and Advanced Practice Providers (APPs -  Physician Assistants and Nurse Practitioners) who all work together to provide you with the care you need, when you need it.  We recommend signing up for the patient portal called "MyChart".  Sign up information is provided on this After Visit Summary.  MyChart is used to connect with patients for Virtual Visits (Telemedicine).  Patients are able to view lab/test results, encounter notes, upcoming appointments, etc.  Non-urgent messages can be sent to your provider as well.   To learn more about what you can do with MyChart, go to [email or web address].    Your next appointment:   6 month(s)  The format for your next appointment:   In Person  Provider:   You may see Yvonne Kendall, MD or one of the following Advanced Practice Providers on your designated Care Team:    Nicolasa Ducking, NP  Eula Listen, PA-C  Marisue Ivan, PA-C  [place], [place]

## 2020-03-28 LAB — CBC
Hematocrit: 33.8 % — ABNORMAL LOW (ref 34.0–46.6)
Hemoglobin: 11.3 g/dL (ref 11.1–15.9)
MCH: 28.7 pg (ref 26.6–33.0)
MCHC: 33.4 g/dL (ref 31.5–35.7)
MCV: 86 fL (ref 79–97)
Platelets: 240 10*3/uL (ref 150–450)
RBC: 3.94 x10E6/uL (ref 3.77–5.28)
RDW: 14.4 % (ref 11.7–15.4)
WBC: 6.6 10*3/uL (ref 3.4–10.8)

## 2020-03-28 LAB — LIPID PANEL
Chol/HDL Ratio: 2.6 ratio (ref 0.0–4.4)
Cholesterol, Total: 100 mg/dL (ref 100–199)
HDL: 39 mg/dL — ABNORMAL LOW (ref >39)
LDL Chol Calc (NIH): 40 mg/dL (ref 0–99)
Triglycerides: 116 mg/dL (ref 0–149)
VLDL Cholesterol Cal: 21 mg/dL (ref 5–40)

## 2020-03-28 LAB — COMPREHENSIVE METABOLIC PANEL
ALT: 12 IU/L (ref 0–32)
AST: 10 IU/L (ref 0–40)
Albumin/Globulin Ratio: 1.7 (ref 1.2–2.2)
Albumin: 4.1 g/dL (ref 3.8–4.9)
Alkaline Phosphatase: 73 IU/L (ref 44–121)
BUN/Creatinine Ratio: 20 (ref 9–23)
BUN: 14 mg/dL (ref 6–24)
Bilirubin Total: 0.5 mg/dL (ref 0.0–1.2)
CO2: 24 mmol/L (ref 20–29)
Calcium: 9.2 mg/dL (ref 8.7–10.2)
Chloride: 100 mmol/L (ref 96–106)
Creatinine, Ser: 0.71 mg/dL (ref 0.57–1.00)
GFR calc Af Amer: 108 mL/min/{1.73_m2} (ref >59)
GFR calc non Af Amer: 94 mL/min/{1.73_m2} (ref >59)
Globulin, Total: 2.4 g/dL (ref 1.5–4.5)
Glucose: 183 mg/dL — ABNORMAL HIGH (ref 65–99)
Potassium: 4 mmol/L (ref 3.5–5.2)
Sodium: 139 mmol/L (ref 134–144)
Total Protein: 6.5 g/dL (ref 6.0–8.5)

## 2020-04-24 ENCOUNTER — Other Ambulatory Visit: Payer: Self-pay | Admitting: Internal Medicine

## 2020-05-02 ENCOUNTER — Encounter: Payer: 59 | Admitting: Hospice and Palliative Medicine

## 2020-05-06 ENCOUNTER — Telehealth: Payer: Self-pay

## 2020-05-06 ENCOUNTER — Other Ambulatory Visit: Payer: Self-pay

## 2020-05-06 ENCOUNTER — Telehealth: Payer: Self-pay | Admitting: Internal Medicine

## 2020-05-06 DIAGNOSIS — I10 Essential (primary) hypertension: Secondary | ICD-10-CM

## 2020-05-06 MED ORDER — OLMESARTAN MEDOXOMIL 5 MG PO TABS: 5.0000 mg | ORAL_TABLET | Freq: Every day | ORAL | 3 refills | Status: DC

## 2020-05-06 NOTE — Telephone Encounter (Signed)
*  STAT* If patient is at the pharmacy, call can be transferred to refill team.   1. Which medications need to be refilled? (please list name of each medication and dose if known)  olmesartan 5 mg po q d   2. Which pharmacy/location (including street and city if local pharmacy) is medication to be sent to?  [facility] [address]   3. Do they need a 30 day or 90 day supply? 90

## 2020-05-06 NOTE — Telephone Encounter (Signed)
Completed medical record request and mailed requesting records to [facility] [place], [place] [address].

## 2020-05-06 NOTE — Telephone Encounter (Signed)
olmesartan (BENICAR) 5 MG tablet 90 tablet 3 05/06/2020    Sig - Route: Take 1 tablet (5 mg total) by mouth daily. - Oral   Sent to pharmacy as: olmesartan (BENICAR) 5 MG tablet   E-Prescribing Status: Receipt confirmed by pharmacy (05/06/2020  3:35 PM EST)     Associated Diagnoses  Essential hypertension      Pharmacy  [id number] - [place], [place] - [address]

## 2020-05-06 NOTE — Telephone Encounter (Signed)
Faxed medical records payment request to [phone].

## 2020-08-21 ENCOUNTER — Other Ambulatory Visit: Payer: Self-pay | Admitting: Internal Medicine

## 2020-08-26 ENCOUNTER — Other Ambulatory Visit: Payer: Self-pay

## 2020-08-26 DIAGNOSIS — E1165 Type 2 diabetes mellitus with hyperglycemia: Secondary | ICD-10-CM

## 2020-08-26 MED ORDER — METFORMIN HCL 500 MG PO TABS: 500.0000 mg | ORAL_TABLET | Freq: Every day | ORAL | 0 refills | Status: DC

## 2020-09-04 ENCOUNTER — Other Ambulatory Visit: Payer: Self-pay

## 2020-09-04 ENCOUNTER — Encounter: Payer: Self-pay | Admitting: Hospice and Palliative Medicine

## 2020-09-04 ENCOUNTER — Ambulatory Visit (INDEPENDENT_AMBULATORY_CARE_PROVIDER_SITE_OTHER): Payer: 59 | Admitting: Hospice and Palliative Medicine

## 2020-09-04 VITALS — BP 135/60 | HR 80 | Temp 98.2°F | Resp 16 | Ht 62.0 in | Wt 200.2 lb

## 2020-09-04 DIAGNOSIS — Z1231 Encounter for screening mammogram for malignant neoplasm of breast: Secondary | ICD-10-CM

## 2020-09-04 DIAGNOSIS — J4520 Mild intermittent asthma, uncomplicated: Secondary | ICD-10-CM

## 2020-09-04 DIAGNOSIS — F411 Generalized anxiety disorder: Secondary | ICD-10-CM

## 2020-09-04 DIAGNOSIS — J449 Chronic obstructive pulmonary disease, unspecified: Secondary | ICD-10-CM

## 2020-09-04 DIAGNOSIS — E1165 Type 2 diabetes mellitus with hyperglycemia: Secondary | ICD-10-CM

## 2020-09-04 DIAGNOSIS — M62830 Muscle spasm of back: Secondary | ICD-10-CM | POA: Diagnosis not present

## 2020-09-04 LAB — POCT GLYCOSYLATED HEMOGLOBIN (HGB A1C): Hemoglobin A1C: 7 % — AB (ref 4.0–5.6)

## 2020-09-04 MED ORDER — EMPAGLIFLOZIN 10 MG PO TABS
10.0000 mg | ORAL_TABLET | Freq: Every day | ORAL | 0 refills | Status: DC
Start: 2020-09-04 — End: 2020-09-25

## 2020-09-04 MED ORDER — ESCITALOPRAM OXALATE 10 MG PO TABS: 10.0000 mg | ORAL_TABLET | Freq: Every day | ORAL | 0 refills | Status: DC

## 2020-09-04 MED ORDER — CYCLOBENZAPRINE HCL 5 MG PO TABS: 5.0000 mg | ORAL_TABLET | Freq: Three times a day (TID) | ORAL | 1 refills | Status: DC | PRN

## 2020-09-04 MED ORDER — ANORO ELLIPTA 62.5-25 MCG/INH IN AEPB: 1.0000 | INHALATION_SPRAY | Freq: Every day | RESPIRATORY_TRACT | 3 refills | Status: DC

## 2020-09-04 MED ORDER — VENTOLIN HFA 108 (90 BASE) MCG/ACT IN AERS: 1.0000 | INHALATION_SPRAY | Freq: Every day | RESPIRATORY_TRACT | 3 refills | Status: DC

## 2020-09-04 NOTE — Progress Notes (Signed)
[facility] [address] [place], [place] [address]  Internal MEDICINE  Office Visit Note  Patient Name: Sherry Torres  [id number]  [id number]  Date of Service: 09/11/2020  Chief Complaint  Patient presents with  . Hip Pain    Rt hip pain  . Follow-up  . Quality Metric Gaps    Eye exam, foot exam scheduled , colonoscopy, mammogram  . Hyperlipidemia  . Hypertension  . Diabetes  . Depression  . Anxiety  . Asthma    HPI Patient is here for routine follow-up Overall things have been going well DM-has noticed her glucose levels have been slightly elevated Followed by cardiology for CAD with angina--remains stable, no recent changes in medications  Feels that her anxiety and depression are not well controlled, feels more irritable and anxious and lack of motivation Has been on medications in the past that were helpful but stops taking them once she is feeling better and feels she no longer needs them  Due for routine labs, mammogram and colon cancer screening   Current Medication: Outpatient Encounter Medications as of 09/04/2020  Medication Sig  . atorvastatin (LIPITOR) 80 MG tablet Take 1 tablet (80 mg total) by mouth daily at 6 PM.  . bisoprolol (ZEBETA) 5 MG tablet Take 1 tablet (5 mg total) by mouth daily.  . clopidogrel (PLAVIX) 75 MG tablet Take 1 tablet (75 mg total) by mouth daily with breakfast.  . empagliflozin (JARDIANCE) 10 MG TABS tablet Take 1 tablet (10 mg total) by mouth daily before breakfast.  . escitalopram (LEXAPRO) 10 MG tablet Take 1 tablet (10 mg total) by mouth daily.  . furosemide (LASIX) 20 MG tablet 1 tablet PO QD PRN  . hydrochlorothiazide (HYDRODIURIL) 25 MG tablet TAKE 1 TABLET BY MOUTH EVERY DAY  . isosorbide mononitrate (IMDUR) 60 MG 24 hr tablet TAKE 1.5 TABLETS (90 MG TOTAL) BY MOUTH IN THE MORNING AND AT BEDTIME.  . metFORMIN (GLUCOPHAGE) 500 MG tablet Take 1 tablet (500 mg total) by mouth daily with breakfast.  .  nitroGLYCERIN (NITROSTAT) 0.4 MG SL tablet Place 1 tablet (0.4 mg total) under the tongue every 5 (five) minutes as needed for chest pain.  Marland Kitchen. olmesartan (BENICAR) 5 MG tablet Take 1 tablet (5 mg total) by mouth daily.  . [DISCONTINUED] cyclobenzaprine (FLEXERIL) 5 MG tablet Take 1 tablet (5 mg total) by mouth 3 (three) times daily as needed for muscle spasms.  . [DISCONTINUED] diclofenac sodium (VOLTAREN) 1 % GEL Apply 4 g topically 4 (four) times daily.  . [DISCONTINUED] sulfamethoxazole-trimethoprim (BACTRIM DS) 800-160 MG tablet Take 1 tablet by mouth 2 (two) times daily.  . [DISCONTINUED] umeclidinium-vilanterol (ANORO ELLIPTA) 62.5-25 MCG/INH AEPB Inhale 1 puff into the lungs daily.  . [DISCONTINUED] VENTOLIN HFA 108 (90 Base) MCG/ACT inhaler Inhale 1-2 puffs into the lungs daily.  . cyclobenzaprine (FLEXERIL) 5 MG tablet Take 1 tablet (5 mg total) by mouth 3 (three) times daily as needed for muscle spasms.  Marland Kitchen. umeclidinium-vilanterol (ANORO ELLIPTA) 62.5-25 MCG/INH AEPB Inhale 1 puff into the lungs daily.  . VENTOLIN HFA 108 (90 Base) MCG/ACT inhaler Inhale 1-2 puffs into the lungs daily.   No facility-administered encounter medications on file as of 09/04/2020.    Surgical History: Past Surgical History:  Procedure Laterality Date  . CARDIAC CATHETERIZATION    . LEFT HEART CATH AND CORONARY ANGIOGRAPHY N/A 01/06/2018   Procedure: LEFT HEART CATH AND CORONARY ANGIOGRAPHY;  Surgeon: Iran OuchArida, Muhammad A, MD;  Location: [facility] INVASIVE CV LAB;  Service: Cardiovascular;  Laterality: N/A;    Medical History: Past Medical History:  Diagnosis Date  . Anxiety   . Asthma   . CAD (coronary artery disease)    a. 12/2017 NSTEMI;  b. 12/2017 MV: basal and mid inflat ischemia; c. 12/2017 Cath: LM nl, LAD 30ost, D1 mild dzs, LCX 40ost, 95d, RCA 46m (nondominant). LCX felt to be culprit but only 1.64mm vessel-->Med rx.  . Diastolic dysfunction    a. 12/2017 Echo: EF 55-60%, no rwma, Gr1 DD; b. 01/2018 Echo:  EF 60-65%, gr2 DD, no rwma, mild MR, nl RV fxn.  . Hyperlipidemia LDL goal <70   . Hypertension   . Recurrent major depressive disorder, in partial remission (HCC) 09/17/2017  . Tobacco abuse   . Type 2 diabetes mellitus (HCC)     Family History: Family History  Problem Relation Age of Onset  . Cancer Mother   . Breast cancer Mother 37  . Hyperlipidemia Father     Social History   Socioeconomic History  . Marital status: Married    Spouse name: Not on file  . Number of children: Not on file  . Years of education: Not on file  . Highest education level: Not on file  Occupational History  . Not on file  Tobacco Use  . Smoking status: Former Smoker    Types: Cigarettes  . Smokeless tobacco: Never Used  Vaping Use  . Vaping Use: Former  Substance and Sexual Activity  . Alcohol use: No  . Drug use: No  . Sexual activity: Not on file  Other Topics Concern  . Not on file  Social History Narrative  . Not on file   Social Determinants of Health   Financial Resource Strain: Not on file  Food Insecurity: Not on file  Transportation Needs: Not on file  Physical Activity: Not on file  Stress: Not on file  Social Connections: Not on file  Intimate Partner Violence: Not on file      Review of Systems  Constitutional: Negative for chills, diaphoresis and fatigue.  HENT: Negative for ear pain, postnasal drip and sinus pressure.   Eyes: Negative for photophobia, discharge, redness, itching and visual disturbance.  Respiratory: Negative for cough, shortness of breath and wheezing.   Cardiovascular: Negative for chest pain, palpitations and leg swelling.  Gastrointestinal: Negative for abdominal pain, constipation, diarrhea, nausea and vomiting.  Genitourinary: Negative for dysuria and flank pain.  Musculoskeletal: Negative for arthralgias, back pain, gait problem and neck pain.  Skin: Negative for color change.  Allergic/Immunologic: Negative for environmental allergies and  food allergies.  Neurological: Negative for dizziness and headaches.  Hematological: Does not bruise/bleed easily.  Psychiatric/Behavioral: Positive for behavioral problems (depression). Negative for agitation and hallucinations. The patient is nervous/anxious.     Vital Signs: BP 135/60   Pulse 80   Temp 98.2 F (36.8 C)   Resp 16   Ht 5\' 2"  (1.575 m)   Wt 200 lb 3.2 oz (90.8 kg)   SpO2 96%   BMI 36.62 kg/m    Physical Exam Vitals reviewed.  Constitutional:      Appearance: Normal appearance. She is obese.  Cardiovascular:     Rate and Rhythm: Normal rate and regular rhythm.     Pulses: Normal pulses.     Heart sounds: Normal heart sounds.  Pulmonary:     Effort: Pulmonary effort is normal.     Breath sounds: Normal breath sounds.  Abdominal:     General: Abdomen  is flat.     Palpations: Abdomen is soft.  Musculoskeletal:        General: Normal range of motion.     Cervical back: Normal range of motion.  Skin:    General: Skin is warm.  Neurological:     General: No focal deficit present.     Mental Status: She is alert and oriented to person, place, and time. Mental status is at baseline.  Psychiatric:        Mood and Affect: Mood normal.    Assessment/Plan: 1. Uncontrolled type 2 diabetes mellitus with hyperglycemia (HCC) A1C 7.0 Add Jardiance, continue with Metformin - POCT HgB A1C - empagliflozin (JARDIANCE) 10 MG TABS tablet; Take 1 tablet (10 mg total) by mouth daily before breakfast.  Dispense: 90 tablet; Refill: 0  2. Encounter for screening mammogram for malignant neoplasm of breast - MM 3D SCREEN BREAST BILATERAL; Future  3. COPD, mild (HCC) Breathing remains stable, requesting refills, followed by pulmonology - umeclidinium-vilanterol (ANORO ELLIPTA) 62.5-25 MCG/INH AEPB; Inhale 1 puff into the lungs daily.  Dispense: 1 each; Refill: 3 - VENTOLIN HFA 108 (90 Base) MCG/ACT inhaler; Inhale 1-2 puffs into the lungs daily.  Dispense: 18 g; Refill:  3  4. Muscle spasm of back Remains well controlled on Flexeril as needed--requesting refills - cyclobenzaprine (FLEXERIL) 5 MG tablet; Take 1 tablet (5 mg total) by mouth 3 (three) times daily as needed for muscle spasms.  Dispense: 90 tablet; Refill: 1  5. GAD (generalized anxiety disorder) Start lexapro for further control of symptoms, closely monitor - escitalopram (LEXAPRO) 10 MG tablet; Take 1 tablet (10 mg total) by mouth daily.  Dispense: 90 tablet; Refill: 0 Samples given of Jardiance 10 mg to ry  General Counseling: Eulla verbalizes understanding of the findings of todays visit and agrees with plan of treatment. I have discussed any further diagnostic evaluation that may be needed or ordered today. We also reviewed her medications today. she has been encouraged to call the office with any questions or concerns that should arise related to todays visit.    Orders Placed This Encounter  Procedures  . MM 3D SCREEN BREAST BILATERAL  . POCT HgB A1C    Meds ordered this encounter  Medications  . umeclidinium-vilanterol (ANORO ELLIPTA) 62.5-25 MCG/INH AEPB    Sig: Inhale 1 puff into the lungs daily.    Dispense:  1 each    Refill:  3    Samples and copay assistance card provided for patient today.  . VENTOLIN HFA 108 (90 Base) MCG/ACT inhaler    Sig: Inhale 1-2 puffs into the lungs daily.    Dispense:  18 g    Refill:  3  . cyclobenzaprine (FLEXERIL) 5 MG tablet    Sig: Take 1 tablet (5 mg total) by mouth 3 (three) times daily as needed for muscle spasms.    Dispense:  90 tablet    Refill:  1  . escitalopram (LEXAPRO) 10 MG tablet    Sig: Take 1 tablet (10 mg total) by mouth daily.    Dispense:  90 tablet    Refill:  0  . empagliflozin (JARDIANCE) 10 MG TABS tablet    Sig: Take 1 tablet (10 mg total) by mouth daily before breakfast.    Dispense:  90 tablet    Refill:  0    Time spent: 30 Minutes Time spent includes review of chart, medications, test results and  follow-up plan with the patient.  This patient was seen  by Leeanne Deed AGNP-C in Collaboration with Dr Lyndon Code as a part of collaborative care agreement     Lubertha Basque. Cass Edinger AGNP-C Internal medicine

## 2020-09-06 ENCOUNTER — Telehealth: Payer: Self-pay

## 2020-09-06 NOTE — Telephone Encounter (Signed)
Faxed Cologuard 

## 2020-09-11 ENCOUNTER — Encounter: Payer: Self-pay | Admitting: Hospice and Palliative Medicine

## 2020-09-18 ENCOUNTER — Other Ambulatory Visit: Payer: Self-pay | Admitting: Hospice and Palliative Medicine

## 2020-09-18 ENCOUNTER — Other Ambulatory Visit: Payer: Self-pay | Admitting: Internal Medicine

## 2020-09-18 DIAGNOSIS — F411 Generalized anxiety disorder: Secondary | ICD-10-CM

## 2020-09-18 NOTE — Telephone Encounter (Signed)
Rx request sent to pharmacy.  

## 2020-09-25 ENCOUNTER — Ambulatory Visit (INDEPENDENT_AMBULATORY_CARE_PROVIDER_SITE_OTHER): Payer: 59 | Admitting: Internal Medicine

## 2020-09-25 ENCOUNTER — Other Ambulatory Visit: Payer: Self-pay

## 2020-09-25 ENCOUNTER — Encounter: Payer: Self-pay | Admitting: Internal Medicine

## 2020-09-25 VITALS — BP 120/70 | HR 77 | Ht 62.0 in | Wt 200.0 lb

## 2020-09-25 DIAGNOSIS — I10 Essential (primary) hypertension: Secondary | ICD-10-CM | POA: Diagnosis not present

## 2020-09-25 DIAGNOSIS — I5032 Chronic diastolic (congestive) heart failure: Secondary | ICD-10-CM | POA: Insufficient documentation

## 2020-09-25 DIAGNOSIS — E785 Hyperlipidemia, unspecified: Secondary | ICD-10-CM

## 2020-09-25 DIAGNOSIS — E1169 Type 2 diabetes mellitus with other specified complication: Secondary | ICD-10-CM

## 2020-09-25 DIAGNOSIS — I25118 Atherosclerotic heart disease of native coronary artery with other forms of angina pectoris: Secondary | ICD-10-CM | POA: Diagnosis not present

## 2020-09-25 NOTE — Patient Instructions (Signed)
Medication Instructions:  - Your physician recommends that you continue on your current medications as directed. Please refer to the Current Medication list given to you today.  *If you need a refill on your cardiac medications before your next appointment, please call your pharmacy*   Lab Work: - none ordered  If you have labs (blood work) drawn today and your tests are completely normal, you will receive your results only by: . MyChart Message (if you have MyChart) OR . A paper copy in the mail If you have any lab test that is abnormal or we need to change your treatment, we will call you to review the results.   Testing/Procedures: - none ordered   Follow-Up: At [facility], you and your health needs are our priority.  As part of our continuing mission to provide you with exceptional heart care, we have created designated Provider Care Teams.  These Care Teams include your primary Cardiologist (physician) and Advanced Practice Providers (APPs -  Physician Assistants and Nurse Practitioners) who all work together to provide you with the care you need, when you need it.  We recommend signing up for the patient portal called "MyChart".  Sign up information is provided on this After Visit Summary.  MyChart is used to connect with patients for Virtual Visits (Telemedicine).  Patients are able to view lab/test results, encounter notes, upcoming appointments, etc.  Non-urgent messages can be sent to your provider as well.   To learn more about what you can do with MyChart, go to [email or web address].    Your next appointment:   6 month(s)  The format for your next appointment:   In Person  Provider:   You may see Christopher End, MD or one of the following Advanced Practice Providers on your designated Care Team:    Christopher Berge, NP  Ryan Dunn, PA-C  Jacquelyn Visser, PA-C  Cadence Furth, PA-C  Caitlin Walker, NP    Other Instructions n/a  

## 2020-09-25 NOTE — Progress Notes (Signed)
Follow-up Outpatient Visit Date: 09/25/2020  Primary Care Provider: Lyndon Code, MD [address] [place] [address]  Chief Complaint: Follow-up stable angina  HPI:  Sherry Torres is a 60 y.o. female with history of coronaryartery diseasewith high-grade disease involving distal LCx and nondominant RCA managed medically, hypertension, hyperlipidemia, diabetes mellitus, and anxiety, who presents for follow-up of coronary artery disease.  I last saw her in 03/2020, at which time she reported stable intermittent fatigue and vague chest discomfort similar to prior visits.  No medication changes or additional testing were pursued.  Today, [name] reports that she feels about the same as at prior visits.  She notices some mild chest discomfort when she "overdoes it," which resolves promptly when she slows down or stops.  She has not needed to use any sublingual nitroglycerin.  She has some mild lower extremity edema at times, which resolves promptly with using as needed furosemide about twice a week.  She has noticed some mild shortness of breath and chest congestion recently that she attributes to allergies.  Otherwise, her she reports that her breathing has been pretty good.  Blood pressures have also been well controlled.  She was recently prescribed empagliflozin by her PCP for management of her diabetes but notes that her insurance has yet to approve it.  She has therefore held off on beginning the samples that she was given.  --------------------------------------------------------------------------------------------------  Past Medical History:  Diagnosis Date  . Anxiety   . Asthma   . CAD (coronary artery disease)    a. 12/2017 NSTEMI;  b. 12/2017 MV: basal and mid inflat ischemia; c. 12/2017 Cath: LM nl, LAD 30ost, D1 mild dzs, LCX 40ost, 95d, RCA 68m (nondominant). LCX felt to be culprit but only 1.61mm vessel-->Med rx.  . Diastolic dysfunction    a. 12/2017 Echo: EF 55-60%, no  rwma, Gr1 DD; b. 01/2018 Echo: EF 60-65%, gr2 DD, no rwma, mild MR, nl RV fxn.  . Hyperlipidemia LDL goal <70   . Hypertension   . Recurrent major depressive disorder, in partial remission (HCC) 09/17/2017  . Tobacco abuse   . Type 2 diabetes mellitus (HCC)    Past Surgical History:  Procedure Laterality Date  . CARDIAC CATHETERIZATION    . LEFT HEART CATH AND CORONARY ANGIOGRAPHY N/A 01/06/2018   Procedure: LEFT HEART CATH AND CORONARY ANGIOGRAPHY;  Surgeon: Iran Ouch, MD;  Location: [facility] INVASIVE CV LAB;  Service: Cardiovascular;  Laterality: N/A;    Current Meds  Medication Sig  . atorvastatin (LIPITOR) 80 MG tablet Take 1 tablet (80 mg total) by mouth daily at 6 PM.  . bisoprolol (ZEBETA) 5 MG tablet Take 1 tablet (5 mg total) by mouth daily.  . clopidogrel (PLAVIX) 75 MG tablet Take 1 tablet (75 mg total) by mouth daily with breakfast.  . cyclobenzaprine (FLEXERIL) 5 MG tablet Take 1 tablet (5 mg total) by mouth 3 (three) times daily as needed for muscle spasms.  . furosemide (LASIX) 20 MG tablet 1 tablet PO QD PRN  . hydrochlorothiazide (HYDRODIURIL) 25 MG tablet TAKE 1 TABLET BY MOUTH EVERY DAY  . isosorbide mononitrate (IMDUR) 60 MG 24 hr tablet TAKE 1 & 1/2 TABLETS (90 MG TOTAL) BY MOUTH IN THE MORNING AND 1 & 1/2 TAB AT BEDTIME.  . metFORMIN (GLUCOPHAGE) 500 MG tablet Take 1 tablet (500 mg total) by mouth daily with breakfast.  . nitroGLYCERIN (NITROSTAT) 0.4 MG SL tablet Place 1 tablet (0.4 mg total) under the tongue every 5 (  five) minutes as needed for chest pain.  Marland Kitchen olmesartan (BENICAR) 5 MG tablet Take 1 tablet (5 mg total) by mouth daily.  Marland Kitchen umeclidinium-vilanterol (ANORO ELLIPTA) 62.5-25 MCG/INH AEPB Inhale 1 puff into the lungs daily.  . VENTOLIN HFA 108 (90 Base) MCG/ACT inhaler Inhale 1-2 puffs into the lungs daily.    Allergies: Codeine  Social History   Tobacco Use  . Smoking status: Former Smoker    Types: Cigarettes  . Smokeless tobacco: Never Used   Vaping Use  . Vaping Use: Former  Substance Use Topics  . Alcohol use: No  . Drug use: No    Family History  Problem Relation Age of Onset  . Cancer Mother   . Breast cancer Mother 18  . Hyperlipidemia Father     Review of Systems: A 12-system review of systems was performed and was negative except as noted in the HPI.  --------------------------------------------------------------------------------------------------  Physical Exam: BP 120/70 (BP Location: Left Arm, Patient Position: Sitting, Cuff Size: Large)   Pulse 77   Ht 5\' 2"  (1.575 m)   Wt 200 lb (90.7 kg)   SpO2 98%   BMI 36.58 kg/m   General:  NAD. Neck: No JVD or HJR. Lungs: Clear to auscultation bilaterally without wheezes or crackles. Heart: Regular rate and rhythm without murmurs, rubs, or gallops. Abdomen: Soft, nontender, nondistended. Extremities: No lower extremity edema.  EKG: Normal sinus rhythm without abnormality.  Lab Results  Component Value Date   WBC 6.6 03/27/2020   HGB 11.3 03/27/2020   HCT 33.8 (L) 03/27/2020   MCV 86 03/27/2020   PLT 240 03/27/2020    Lab Results  Component Value Date   NA 139 03/27/2020   K 4.0 03/27/2020   CL 100 03/27/2020   CO2 24 03/27/2020   BUN 14 03/27/2020   CREATININE 0.71 03/27/2020   GLUCOSE 183 (H) 03/27/2020   ALT 12 03/27/2020    Lab Results  Component Value Date   CHOL 100 03/27/2020   HDL 39 (L) 03/27/2020   LDLCALC 40 03/27/2020   TRIG 116 03/27/2020   CHOLHDL 2.6 03/27/2020    --------------------------------------------------------------------------------------------------  ASSESSMENT AND PLAN: Coronary artery disease with stable angina: [name] has longstanding chest discomfort when she "overdoes it," which has been stable for the last few years.  We will continue her current antianginal regimen consisting of bisoprolol and isosorbide mononitrate, as well as long-term antiplatelet therapy with clopidogrel.  Chronic  HFpEF: [name] appears grossly euvolemic with only occasional edema that is well controlled with as needed furosemide.  She has NYHA class II symptoms.  Given her history of type 2 diabetes mellitus and HFpEF (most recent echo in 01/2018 showed grade 2 diastolic dysfunction with LVEF of 60-65%) I think it would be beneficial to consider addition of an SGLT2 inhibitor such as empagliflozin.  I have encouraged her to begin taking the samples provided by her PCP.  Hypertension: Blood pressure well controlled today.  No medication changes at this time.  Hyperlipidemia associated with type 2 diabetes mellitus: Most recent lipid panel in 03/2020 showed excellent lipid control with LDL of 40.  We will plan to continue atorvastatin 80 mg daily.  Morbid obesity: BMI remains greater than 35 with multiple comorbidities (diabetes mellitus, HFpEF, coronary artery disease, hypertension, and hyperlipidemia).  Weight loss encouraged through diet and exercise.  Follow-up: Return to clinic in 6 months.  04/2020, MD 09/25/2020 9:44 AM

## 2020-11-06 ENCOUNTER — Telehealth: Payer: Self-pay

## 2020-11-06 ENCOUNTER — Other Ambulatory Visit: Payer: Self-pay

## 2020-11-06 DIAGNOSIS — M62830 Muscle spasm of back: Secondary | ICD-10-CM

## 2020-11-06 NOTE — Telephone Encounter (Signed)
As per pt that she had enough cyclobenzaprine for now so denied med refill and  we can send when pt need refills

## 2020-11-12 ENCOUNTER — Telehealth: Payer: Self-pay

## 2020-11-12 NOTE — Telephone Encounter (Signed)
Spoke to pt, she said she would to the cologuard tomorrow

## 2020-12-01 ENCOUNTER — Other Ambulatory Visit: Payer: Self-pay | Admitting: Internal Medicine

## 2020-12-02 ENCOUNTER — Telehealth: Payer: Self-pay

## 2020-12-02 ENCOUNTER — Other Ambulatory Visit: Payer: Self-pay

## 2020-12-02 ENCOUNTER — Telehealth: Payer: Self-pay | Admitting: Internal Medicine

## 2020-12-02 DIAGNOSIS — M62830 Muscle spasm of back: Secondary | ICD-10-CM

## 2020-12-02 MED ORDER — ISOSORBIDE MONONITRATE ER 60 MG PO TB24: ORAL_TABLET | ORAL | 0 refills | Status: DC

## 2020-12-02 MED ORDER — CYCLOBENZAPRINE HCL 5 MG PO TABS: 5.0000 mg | ORAL_TABLET | Freq: Three times a day (TID) | ORAL | 1 refills | Status: DC | PRN

## 2020-12-02 NOTE — Telephone Encounter (Signed)
*  STAT* If patient is at the pharmacy, call can be transferred to refill team.   1. Which medications need to be refilled? (please list name of each medication and dose if known) isosorbide  2. Which pharmacy/location (including street and city if local pharmacy) is medication to be sent to? [facility] in [place]  3. Do they need a 30 day or 90 day supply? 90

## 2020-12-02 NOTE — Telephone Encounter (Signed)
Requested Prescriptions   Signed Prescriptions Disp Refills   isosorbide mononitrate (IMDUR) 60 MG 24 hr tablet 270 tablet 0    Sig: TAKE 1 & 1/2 TABLETS (90 MG TOTAL) BY MOUTH IN THE MORNING AND 1 & 1/2 TAB AT BEDTIME.    Authorizing Provider: END, CHRISTOPHER    Ordering User: Thayer Headings, Rudolfo Brandow L

## 2020-12-02 NOTE — Telephone Encounter (Signed)
Sent message to patient to call office to screen for 12/03/20 appointment-Toni

## 2020-12-03 ENCOUNTER — Encounter: Payer: 59 | Admitting: Internal Medicine

## 2021-01-01 ENCOUNTER — Ambulatory Visit (INDEPENDENT_AMBULATORY_CARE_PROVIDER_SITE_OTHER): Payer: 59 | Admitting: Nurse Practitioner

## 2021-01-01 ENCOUNTER — Other Ambulatory Visit: Payer: Self-pay

## 2021-01-01 ENCOUNTER — Encounter: Payer: Self-pay | Admitting: Nurse Practitioner

## 2021-01-01 VITALS — BP 122/60 | HR 85 | Temp 97.5°F | Resp 16 | Ht 61.5 in | Wt 193.6 lb

## 2021-01-01 DIAGNOSIS — E1165 Type 2 diabetes mellitus with hyperglycemia: Secondary | ICD-10-CM | POA: Diagnosis not present

## 2021-01-01 DIAGNOSIS — F411 Generalized anxiety disorder: Secondary | ICD-10-CM

## 2021-01-01 DIAGNOSIS — R300 Dysuria: Secondary | ICD-10-CM

## 2021-01-01 DIAGNOSIS — Z0001 Encounter for general adult medical examination with abnormal findings: Secondary | ICD-10-CM

## 2021-01-01 DIAGNOSIS — Z6835 Body mass index (BMI) 35.0-35.9, adult: Secondary | ICD-10-CM

## 2021-01-01 DIAGNOSIS — B351 Tinea unguium: Secondary | ICD-10-CM

## 2021-01-01 DIAGNOSIS — I10 Essential (primary) hypertension: Secondary | ICD-10-CM | POA: Diagnosis not present

## 2021-01-01 DIAGNOSIS — J449 Chronic obstructive pulmonary disease, unspecified: Secondary | ICD-10-CM

## 2021-01-01 DIAGNOSIS — I2510 Atherosclerotic heart disease of native coronary artery without angina pectoris: Secondary | ICD-10-CM | POA: Diagnosis not present

## 2021-01-01 DIAGNOSIS — E782 Mixed hyperlipidemia: Secondary | ICD-10-CM

## 2021-01-01 DIAGNOSIS — Z1231 Encounter for screening mammogram for malignant neoplasm of breast: Secondary | ICD-10-CM

## 2021-01-01 LAB — POCT GLYCOSYLATED HEMOGLOBIN (HGB A1C): Hemoglobin A1C: 6.3 % — AB (ref 4.0–5.6)

## 2021-01-01 MED ORDER — BUPROPION HCL ER (XL) 150 MG PO TB24: 150.0000 mg | ORAL_TABLET | Freq: Every day | ORAL | 0 refills | Status: DC

## 2021-01-01 MED ORDER — TRELEGY ELLIPTA 100-62.5-25 MCG/INH IN AEPB: 1.0000 | INHALATION_SPRAY | Freq: Every day | RESPIRATORY_TRACT | 11 refills | Status: DC

## 2021-01-01 NOTE — Progress Notes (Addendum)
[facility] [address] [place], [place] [address]  Internal MEDICINE  Office Visit Note  Patient Name: Sherry Torres  [id number]  [id number]  Date of Service: 01/01/2021  Chief Complaint  Patient presents with   Annual Exam    Discuss meds   Anxiety   Depression   Diabetes   Hyperlipidemia   Hypertension   Asthma   Quality Metric Gaps    Eye exam, mammogram, colonoscopy    HPI  Sumie presents for an annual well visit and physical exam. she has multiple chronic medical conditions including hyperlipidemia, hypertension, COPD, asthma, anxiety, depression, diabetes, CAD, osteoarthritis, HFpEF. Her heart failure is managed by cardiology. She has declined the COVID vaccine. Patient is due for diabetic eye exam. She is also due for screening mammogram. She denies any pain. She reports having a yellow toe nail.  Her blood pressure is wnl today.  -A1C is 6.3 today. This is improved from 7.0 in April 2022.       Current Medication: Outpatient Encounter Medications as of 01/01/2021  Medication Sig   atorvastatin (LIPITOR) 80 MG tablet Take 1 tablet (80 mg total) by mouth daily at 6 PM.   bisoprolol (ZEBETA) 5 MG tablet Take 1 tablet (5 mg total) by mouth daily.   buPROPion (WELLBUTRIN XL) 150 MG 24 hr tablet Take 1 tablet (150 mg total) by mouth daily.   clopidogrel (PLAVIX) 75 MG tablet Take 1 tablet (75 mg total) by mouth daily with breakfast.   cyclobenzaprine (FLEXERIL) 5 MG tablet Take 1 tablet (5 mg total) by mouth 3 (three) times daily as needed for muscle spasms.   Fluticasone-Umeclidin-Vilant (TRELEGY ELLIPTA) 100-62.5-25 MCG/INH AEPB Inhale 1 puff into the lungs daily.   furosemide (LASIX) 20 MG tablet TAKE 1 TABLET BY MOUTH EVERY DAY AS NEEDED   hydrochlorothiazide (HYDRODIURIL) 25 MG tablet TAKE 1 TABLET BY MOUTH EVERY DAY   isosorbide mononitrate (IMDUR) 60 MG 24 hr tablet TAKE 1 & 1/2 TABLETS (90 MG TOTAL) BY MOUTH IN THE MORNING AND 1 & 1/2 TAB AT  BEDTIME.   metFORMIN (GLUCOPHAGE) 500 MG tablet Take 1 tablet (500 mg total) by mouth daily with breakfast.   nitroGLYCERIN (NITROSTAT) 0.4 MG SL tablet Place 1 tablet (0.4 mg total) under the tongue every 5 (five) minutes as needed for chest pain.   olmesartan (BENICAR) 5 MG tablet Take 1 tablet (5 mg total) by mouth daily.   umeclidinium-vilanterol (ANORO ELLIPTA) 62.5-25 MCG/INH AEPB Inhale 1 puff into the lungs daily.   VENTOLIN HFA 108 (90 Base) MCG/ACT inhaler Inhale 1-2 puffs into the lungs daily.   JARDIANCE 10 MG TABS tablet Take 10 mg by mouth daily.   No facility-administered encounter medications on file as of 01/01/2021.    Surgical History: Past Surgical History:  Procedure Laterality Date   CARDIAC CATHETERIZATION     LEFT HEART CATH AND CORONARY ANGIOGRAPHY N/A 01/06/2018   Procedure: LEFT HEART CATH AND CORONARY ANGIOGRAPHY;  Surgeon: Iran Ouch, MD;  Location: [facility] INVASIVE CV LAB;  Service: Cardiovascular;  Laterality: N/A;    Medical History: Past Medical History:  Diagnosis Date   Anxiety    Asthma    CAD (coronary artery disease)    a. 12/2017 NSTEMI;  b. 12/2017 MV: basal and mid inflat ischemia; c. 12/2017 Cath: LM nl, LAD 30ost, D1 mild dzs, LCX 40ost, 95d, RCA 53m (nondominant). LCX felt to be culprit but only 1.87mm vessel-->Med rx.   Diastolic dysfunction  a. 12/2017 Echo: EF 55-60%, no rwma, Gr1 DD; b. 01/2018 Echo: EF 60-65%, gr2 DD, no rwma, mild MR, nl RV fxn.   Hyperlipidemia LDL goal <70    Hypertension    Recurrent major depressive disorder, in partial remission (HCC) 09/17/2017   Tobacco abuse    Type 2 diabetes mellitus (HCC)     Family History: Family History  Problem Relation Age of Onset   Cancer Mother    Breast cancer Mother 51   Hyperlipidemia Father     Social History   Socioeconomic History   Marital status: Married    Spouse name: Not on file   Number of children: Not on file   Years of education: Not on file   Highest  education level: Not on file  Occupational History   Not on file  Tobacco Use   Smoking status: Former    Types: Cigarettes   Smokeless tobacco: Never  Vaping Use   Vaping Use: Former  Substance and Sexual Activity   Alcohol use: No   Drug use: No   Sexual activity: Not on file  Other Topics Concern   Not on file  Social History Narrative   Not on file   Social Determinants of Health   Financial Resource Strain: Not on file  Food Insecurity: Not on file  Transportation Needs: Not on file  Physical Activity: Not on file  Stress: Not on file  Social Connections: Not on file  Intimate Partner Violence: Not on file      Review of Systems  Vital Signs: BP 122/60   Pulse 85   Temp (!) 97.5 F (36.4 C)   Resp 16   Ht 5' 1.5" (1.562 m)   Wt 193 lb 9.6 oz (87.8 kg)   SpO2 98%   BMI 35.99 kg/m    Physical Exam Vitals reviewed.  Constitutional:      General: She is not in acute distress.    Appearance: Normal appearance. She is well-developed. She is obese. She is not ill-appearing or diaphoretic.  HENT:     Head: Normocephalic and atraumatic.     Right Ear: Tympanic membrane, ear canal and external ear normal.     Left Ear: Tympanic membrane, ear canal and external ear normal.     Nose: Nose normal. No congestion or rhinorrhea.     Mouth/Throat:     Mouth: Mucous membranes are moist.     Pharynx: Oropharynx is clear. No oropharyngeal exudate or posterior oropharyngeal erythema.  Eyes:     General: No scleral icterus.       Right eye: No discharge.        Left eye: No discharge.     Conjunctiva/sclera: Conjunctivae normal.     Pupils: Pupils are equal, round, and reactive to light.  Neck:     Thyroid: No thyromegaly.     Vascular: No carotid bruit or JVD.     Trachea: No tracheal deviation.  Cardiovascular:     Rate and Rhythm: Normal rate and regular rhythm.     Pulses: Normal pulses.     Heart sounds: Normal heart sounds. No murmur heard.   No friction  rub. No gallop.  Pulmonary:     Effort: Pulmonary effort is normal. No respiratory distress.     Breath sounds: Normal breath sounds. No stridor. No wheezing or rales.  Chest:     Chest wall: No tenderness.  Abdominal:     General: Bowel sounds are normal. There is  no distension.     Palpations: Abdomen is soft. There is no mass.     Tenderness: There is no abdominal tenderness. There is no guarding or rebound.  Musculoskeletal:        General: No tenderness or deformity. Normal range of motion.     Cervical back: Normal range of motion and neck supple.  Lymphadenopathy:     Cervical: No cervical adenopathy.  Skin:    General: Skin is warm and dry.     Capillary Refill: Capillary refill takes less than 2 seconds.     Coloration: Skin is not pale.     Findings: No erythema or rash.  Neurological:     Mental Status: She is alert and oriented to person, place, and time.     Cranial Nerves: No cranial nerve deficit.     Motor: No abnormal muscle tone.     Coordination: Coordination normal.     Deep Tendon Reflexes: Reflexes are normal and symmetric.  Psychiatric:        Mood and Affect: Mood normal.        Behavior: Behavior normal.        Thought Content: Thought content normal.        Judgment: Judgment normal.     Assessment/Plan: 1. Encounter for general adult medical examination with abnormal findings Age-appropriate preventive screenings and vaccinations discussed, annual physical exam completed. Routine labs for health maintenance not ordered, informed patient additional labs may be ordered after her cardiologist has her labs drawn. PHM updated. Mammogram due.    2. Uncontrolled type 2 diabetes mellitus with hyperglycemia (HCC) A1C improving, continue medications as prescribed, jardiance refill ordered.  - POCT HgB A1C - JARDIANCE 10 MG TABS tablet; Take 10 mg by mouth daily.   3. Chronic obstructive pulmonary disease, unspecified COPD type (HCC) Stop anoro, start  trelegy. Follow up in 4 weeks.  - Fluticasone-Umeclidin-Vilant (TRELEGY ELLIPTA) 100-62.5-25 MCG/INH AEPB; Inhale 1 puff into the lungs daily.  Dispense: 1 each; Refill: 11  4. Benign hypertension Currently stable, followed by cardiology.   5. Coronary artery disease involving native coronary artery of native heart without angina pectoris Followed by cardiology, has nitroglycerin on hand for episodes of angina.   6. Mixed hyperlipidemia Taking high intensity statin therapy. Lipid panel from last year normal except for low HDL 39. Patient is seeing her cardiologist soon and stated he will have labs drawn at the clinic.   7. Onychomycosis of toenail Fungal infection of toenail, has tried topicals. Terbinafine prescribed.  - terbinafine (LAMISIL) 250 MG tablet; Take 1 tablet (250 mg total) by mouth daily. For 12 weeks for onychomycosis of toenail.  Dispense: 90 tablet; Refill: 0  8. GAD (generalized anxiety disorder) Stable with current medication, refill ordered.  - buPROPion (WELLBUTRIN XL) 150 MG 24 hr tablet; Take 1 tablet (150 mg total) by mouth daily.  Dispense: 30 tablet; Refill: 0  9. Encounter for screening mammogram for malignant neoplasm of breast - MM DIGITAL SCREENING BILATERAL; Future  10. Dysuria Routine urinalysis done.  - UA/M w/rflx Culture, Routine 10 . BMI 35 Obesity Counseling: Risk Assessment: An assessment of behavioral risk factors was made today and includes lack of exercise sedentary lifestyle, lack of portion control and poor dietary habits.  Risk Modification Advice: She was counseled on portion control guidelines. Restricting daily caloric intake to 1500. The detrimental long term effects of obesity on her health and ongoing poor compliance was also discussed with the patient.  General Counseling: Meekah verbalizes understanding of the findings of todays visit and agrees with plan of treatment. I have discussed any further diagnostic evaluation that may  be needed or ordered today. We also reviewed her medications today. she has been encouraged to call the office with any questions or concerns that should arise related to todays visit.    Orders Placed This Encounter  Procedures   MM DIGITAL SCREENING BILATERAL   UA/M w/rflx Culture, Routine   POCT HgB A1C    Meds ordered this encounter  Medications   Fluticasone-Umeclidin-Vilant (TRELEGY ELLIPTA) 100-62.5-25 MCG/INH AEPB    Sig: Inhale 1 puff into the lungs daily.    Dispense:  1 each    Refill:  11   buPROPion (WELLBUTRIN XL) 150 MG 24 hr tablet    Sig: Take 1 tablet (150 mg total) by mouth daily.    Dispense:  30 tablet    Refill:  0    Return in about 1 month (around 02/01/2021) for F/U new med eval, Illias Pantano PCP.   Total time spent:30 Minutes Time spent includes review of chart, medications, test results, and follow up plan with the patient.   [place] Controlled Substance Database was reviewed by me.  This patient was seen by Sallyanne Kuster, FNP-C in collaboration with Dr. Beverely Risen as a part of collaborative care agreement.  Makhai Fulco R. Tedd Sias, MSN, FNP-C Internal medicine

## 2021-01-07 ENCOUNTER — Telehealth: Payer: Self-pay

## 2021-01-07 NOTE — Telephone Encounter (Signed)
Patient called stating she received two call from this number. I did not see any notes in her chart. I have sent a message out to all staff in the office for a return call.

## 2021-01-10 LAB — UA/M W/RFLX CULTURE, ROUTINE

## 2021-01-13 MED ORDER — TERBINAFINE HCL 250 MG PO TABS: 250.0000 mg | ORAL_TABLET | Freq: Every day | ORAL | 0 refills | Status: DC

## 2021-01-25 ENCOUNTER — Other Ambulatory Visit: Payer: Self-pay | Admitting: Nurse Practitioner

## 2021-01-25 DIAGNOSIS — F411 Generalized anxiety disorder: Secondary | ICD-10-CM

## 2021-01-29 ENCOUNTER — Ambulatory Visit: Payer: 59 | Admitting: Nurse Practitioner

## 2021-01-30 ENCOUNTER — Other Ambulatory Visit: Payer: Self-pay

## 2021-01-30 DIAGNOSIS — E1165 Type 2 diabetes mellitus with hyperglycemia: Secondary | ICD-10-CM

## 2021-01-30 MED ORDER — JARDIANCE 10 MG PO TABS: 10.0000 mg | ORAL_TABLET | Freq: Every day | ORAL | 0 refills | Status: DC

## 2021-02-17 ENCOUNTER — Telehealth: Payer: Self-pay | Admitting: Internal Medicine

## 2021-02-17 NOTE — Telephone Encounter (Signed)
  Pt c/o medication issue:  1. Name of Medication: steroids for inflammation  2. How are you currently taking this medication (dosage and times per day)?   3. Are you having a reaction (difficulty breathing--STAT)?   4. What is your medication issue? Pt would like to ask Dr. Okey Dupre if its ok to take steroids. She said she's been seeing at [facility] for an inflammation and her doctor wants to prescribed her steroids

## 2021-02-17 NOTE — Telephone Encounter (Signed)
Spoke with the pt regarding her question concerning starting steroids.   Advised the below: --Steroids can increase volume status, BP, glucose. --Avoid steroids, if possible. If steroids are recommended by her ortho, we recommend the shortest course possible. --Please monitor BP and volume status /sx  closely during your course (we reviewed sx). --Call the office if  (A) PRN lasix needed daily (for repeat BMET) OR (b) Sx / BP uncontrolled despite lasix daily (repeat BMET & OV).   She will send a MyChart message with the contact info of the ortho provider when home. She asked that we also relay this to that PA-C.  I informed her that I will Cc Dr. Okey Dupre to make him aware of her call.   Dr. Okey Dupre, please let me know if you have additional recommendations.

## 2021-02-18 NOTE — Telephone Encounter (Signed)
I agree with Ms. Visser's recommendations.  Thank you for the update.  Yvonne Kendall, MD [facility]

## 2021-02-20 NOTE — Telephone Encounter (Signed)
Attempted to call pt to notify Dr. Okey Dupre agrees with Jacquelyn's recc.  No answer at this time. Pt's voicemail is full. Unable to leave vm.

## 2021-02-27 ENCOUNTER — Other Ambulatory Visit: Payer: Self-pay

## 2021-02-27 DIAGNOSIS — E1165 Type 2 diabetes mellitus with hyperglycemia: Secondary | ICD-10-CM

## 2021-02-27 MED ORDER — METFORMIN HCL 500 MG PO TABS: 500.0000 mg | ORAL_TABLET | Freq: Every day | ORAL | 0 refills | Status: DC

## 2021-03-05 ENCOUNTER — Other Ambulatory Visit: Payer: Self-pay | Admitting: Internal Medicine

## 2021-03-15 ENCOUNTER — Other Ambulatory Visit: Payer: Self-pay | Admitting: Internal Medicine

## 2021-03-17 NOTE — Telephone Encounter (Signed)
Please schedule 6 month F/U appointment. Thank you! 

## 2021-03-17 NOTE — Telephone Encounter (Signed)
Scheduled

## 2021-03-23 ENCOUNTER — Other Ambulatory Visit: Payer: Self-pay | Admitting: Internal Medicine

## 2021-03-23 ENCOUNTER — Other Ambulatory Visit: Payer: Self-pay | Admitting: Nurse Practitioner

## 2021-03-23 DIAGNOSIS — I10 Essential (primary) hypertension: Secondary | ICD-10-CM

## 2021-03-23 DIAGNOSIS — E1165 Type 2 diabetes mellitus with hyperglycemia: Secondary | ICD-10-CM

## 2021-03-23 DIAGNOSIS — B351 Tinea unguium: Secondary | ICD-10-CM

## 2021-03-23 DIAGNOSIS — M62830 Muscle spasm of back: Secondary | ICD-10-CM

## 2021-04-16 ENCOUNTER — Encounter: Payer: Self-pay | Admitting: Nurse Practitioner

## 2021-04-16 ENCOUNTER — Other Ambulatory Visit: Payer: Self-pay

## 2021-04-16 ENCOUNTER — Ambulatory Visit (INDEPENDENT_AMBULATORY_CARE_PROVIDER_SITE_OTHER): Payer: 59 | Admitting: Nurse Practitioner

## 2021-04-16 VITALS — BP 116/64 | HR 77 | Ht 61.5 in | Wt 189.0 lb

## 2021-04-16 DIAGNOSIS — I2510 Atherosclerotic heart disease of native coronary artery without angina pectoris: Secondary | ICD-10-CM | POA: Diagnosis not present

## 2021-04-16 DIAGNOSIS — I10 Essential (primary) hypertension: Secondary | ICD-10-CM | POA: Diagnosis not present

## 2021-04-16 DIAGNOSIS — I5032 Chronic diastolic (congestive) heart failure: Secondary | ICD-10-CM

## 2021-04-16 DIAGNOSIS — E785 Hyperlipidemia, unspecified: Secondary | ICD-10-CM | POA: Diagnosis not present

## 2021-04-16 NOTE — Patient Instructions (Signed)
Medication Instructions:  ? ?Your physician recommends that you continue on your current medications as directed. Please refer to the Current Medication list given to you today. ? ?*If you need a refill on your cardiac medications before your next appointment, please call your pharmacy* ? ? ?Lab Work: ? ?None ordered ? ?If you have labs (blood work) drawn today and your tests are completely normal, you will receive your results only by: ?MyChart Message (if you have MyChart) OR ?A paper copy in the mail ?If you have any lab test that is abnormal or we need to change your treatment, we will call you to review the results. ? ? ?Testing/Procedures: ? ?None ordered ? ? ?Follow-Up: ?At [facility], you and your health needs are our priority.  As part of our continuing mission to provide you with exceptional heart care, we have created designated Provider Care Teams.  These Care Teams include your primary Cardiologist (physician) and Advanced Practice Providers (APPs -  Physician Assistants and Nurse Practitioners) who all work together to provide you with the care you need, when you need it. ? ?We recommend signing up for the patient portal called "MyChart".  Sign up information is provided on this After Visit Summary.  MyChart is used to connect with patients for Virtual Visits (Telemedicine).  Patients are able to view lab/test results, encounter notes, upcoming appointments, etc.  Non-urgent messages can be sent to your provider as well.   ?To learn more about what you can do with MyChart, go to [email or web address].   ? ?Your next appointment:   ?6 month(s) ? ?The format for your next appointment:   ?In Person ? ?Provider:   ?You may see Christopher End, MD or one of the following Advanced Practice Providers on your designated Care Team:   ?Christopher Berge, NP ?Ryan Dunn, PA-C ?Cadence Furth, PA-C ? ? ?Other Instructions ? ? ?

## 2021-04-16 NOTE — Progress Notes (Signed)
Office Visit    Patient Name: Sherry Torres Date of Encounter: 04/16/2021  Primary Care Provider:  Lyndon Code, MD Primary Cardiologist:  Yvonne Kendall, MD  Chief Complaint    60 year old female with history of diabetes, hypertension, hyperlipidemia, anxiety, and coronary artery disease, presents for follow-up related to CAD.  Past Medical History    Past Medical History:  Diagnosis Date   Anxiety    Asthma    CAD (coronary artery disease)    a. 12/2017 NSTEMI;  b. 12/2017 MV: basal and mid inflat ischemia; c. 12/2017 Cath: LM nl, LAD 30ost, D1 mild dzs, LCX 40ost, 95d, RCA 81m (nondominant). LCX felt to be culprit but only 1.23mm vessel-->Med rx.   Chronic heart failure with preserved ejection fraction (HFpEF) (HCC)    a. 12/2017 Echo: EF 55-60%, no rwma, Gr1 DD; b. 01/2018 Echo: EF 60-65%, gr2 DD, no rwma, mild MR, nl RV fxn.   Hyperlipidemia LDL goal <70    Hypertension    Recurrent major depressive disorder, in partial remission (HCC) 09/17/2017   Tobacco abuse    Type 2 diabetes mellitus (HCC)    Past Surgical History:  Procedure Laterality Date   CARDIAC CATHETERIZATION     LEFT HEART CATH AND CORONARY ANGIOGRAPHY N/A 01/06/2018   Procedure: LEFT HEART CATH AND CORONARY ANGIOGRAPHY;  Surgeon: Iran Ouch, MD;  Location: [facility] INVASIVE CV LAB;  Service: Cardiovascular;  Laterality: N/A;    Allergies  Allergies  Allergen Reactions   Codeine Itching    History of Present Illness    60 year old female with the above past medical history including diabetes, hypertension, hyperlipidemia, anxiety, chronic HFpEF, and coronary artery disease.  She was admitted in August 2019 with shoulder and chest pain and found to have mild troponin elevation.  Stress testing showed inferolateral and basal ischemia.  Echo showed normal LV function with grade 1 diastolic dysfunction CT of the chest was negative for PE but did show coronary calcifications.  Diagnostic  catheterization showed 95% distal circumflex stenosis as well as a 95% mid RCA stenosis (nondominant).  The circumflex was felt to be the culprit but was only a 1.5 mm vessel and decision was made to treat medically.  [name] was last seen in cardiology clinic in May 2022 at which time she reported occasional, mild chest discomfort when she "overdoes it."  Antianginal therapy including bisoprolol, Imdur, along with clopidogrel therapy were continued.  Since her last visit, she has done well.  She works full-time as a Production designer, theatre/television/film at [employer].  She notes that she is fairly busy at work and does not get much of a break.  She is busy when coming home from work and as a result does not have time to exercise.  We discussed the importance of this today.  She has not been experiencing any chest pain or dyspnea and denies PND, orthopnea, palpitations, dizziness, syncope, edema, or early satiety.  She has been experiencing discomfort in her right upper forearm and elbow following an injury earlier this year.  She has been evaluated Ortho.  There was some discussion about possibly placing her on steroids and she just wants to make sure that that will be okay from our standpoint.  Home Medications    Current Outpatient Medications  Medication Sig Dispense Refill   atorvastatin (LIPITOR) 80 MG tablet TAKE 1 TABLET BY MOUTH DAILY AT 6 PM. 90 tablet 3   bisoprolol (ZEBETA) 5 MG tablet TAKE 1  TABLET BY MOUTH EVERY DAY 90 tablet 3   buPROPion (WELLBUTRIN XL) 150 MG 24 hr tablet TAKE 1 TABLET BY MOUTH EVERY DAY 90 tablet 1   clopidogrel (PLAVIX) 75 MG tablet TAKE 1 TABLET BY MOUTH DAILY WITH BREAKFAST. 90 tablet 3   cyclobenzaprine (FLEXERIL) 5 MG tablet TAKE 1 TABLET BY MOUTH THREE TIMES A DAY AS NEEDED FOR MUSCLE SPASMS 90 tablet 1   furosemide (LASIX) 20 MG tablet TAKE 1 TABLET BY MOUTH EVERY DAY AS NEEDED 90 tablet 0   hydrochlorothiazide (HYDRODIURIL) 25 MG tablet TAKE 1 TABLET BY MOUTH EVERY DAY  90 tablet 3   isosorbide mononitrate (IMDUR) 60 MG 24 hr tablet TAKE 1 & 1/2 TABLETS (90 MG TOTAL) BY MOUTH IN THE MORNING AND 1 & 1/2 TAB AT BEDTIME. 270 tablet 0   JARDIANCE 10 MG TABS tablet TAKE 1 TABLET BY MOUTH EVERY DAY 90 tablet 0   metFORMIN (GLUCOPHAGE) 500 MG tablet Take 1 tablet (500 mg total) by mouth daily with breakfast. 180 tablet 0   nitroGLYCERIN (NITROSTAT) 0.4 MG SL tablet Place 1 tablet (0.4 mg total) under the tongue every 5 (five) minutes as needed for chest pain. 30 tablet 2   olmesartan (BENICAR) 5 MG tablet Take 1 tablet (5 mg total) by mouth daily. 90 tablet 3   VENTOLIN HFA 108 (90 Base) MCG/ACT inhaler Inhale 1-2 puffs into the lungs daily. 18 g 3   No current facility-administered medications for this visit.     Review of Systems    Right elbow discomfort following injury earlier this year.  She denies chest pain, dyspnea, palpitations, PND, orthopnea, dizziness, syncope, edema, or early satiety.  All other systems reviewed and are otherwise negative except as noted above.   Physical Exam    VS:  BP 116/64 (BP Location: Left Arm, Patient Position: Sitting, Cuff Size: Large)   Pulse 77   Ht 5' 1.5" (1.562 m)   Wt 189 lb (85.7 kg)   SpO2 98%   BMI 35.13 kg/m  , BMI Body mass index is 35.13 kg/m.     GEN: Well nourished, well developed, in no acute distress. HEENT: normal. Neck: Supple, no JVD, carotid bruits, or masses. Cardiac: RRR, no murmurs, rubs, or gallops. No clubbing, cyanosis, edema.  Radials/PT 2+ and equal bilaterally.  Respiratory:  Respirations regular and unlabored, clear to auscultation bilaterally. GI: Soft, nontender, nondistended, BS + x 4. MS: no deformity or atrophy. Skin: warm and dry, no rash. Neuro:  Strength and sensation are intact. Psych: Normal affect.  Accessory Clinical Findings    ECG personally reviewed by me today -regular sinus rhythm, 77- no acute changes.  Lab Results  Component Value Date   WBC 6.6 03/27/2020    HGB 11.3 03/27/2020   HCT 33.8 (L) 03/27/2020   MCV 86 03/27/2020   PLT 240 03/27/2020   Lab Results  Component Value Date   CREATININE 0.71 03/27/2020   BUN 14 03/27/2020   NA 139 03/27/2020   K 4.0 03/27/2020   CL 100 03/27/2020   CO2 24 03/27/2020   Lab Results  Component Value Date   ALT 12 03/27/2020   AST 10 03/27/2020   ALKPHOS 73 03/27/2020   BILITOT 0.5 03/27/2020   Lab Results  Component Value Date   CHOL 100 03/27/2020   HDL 39 (L) 03/27/2020   LDLCALC 40 03/27/2020   TRIG 116 03/27/2020   CHOLHDL 2.6 03/27/2020    Lab Results  Component Value Date  HGBA1C 6.3 (A) 01/01/2021    Assessment & Plan    1.  Coronary artery disease: Status post catheterization August 2019 revealing 95% distal circumflex and RCA stenoses.  Both areas were small in diameter and not amenable to PCI.  She has been treated medically since then.  She notes that over the past 6 months, she has done exceptionally well without chest pain or dyspnea.  I encouraged her to begin exercising for at least 30 minutes a day even if broken up into many segments.  She remains on statin, Plavix, beta-blocker, ARB, and nitrate therapy.  2.  Chronic HFpEF: Euvolemic on exam with stable heart rate and blood pressure.  Continue current regimen which now includes empagliflozin in addition to beta-blocker, ARB, and as needed Lasix.  3.  Essential hypertension: Stable on current regimen.  4.  Hyperlipidemia: LDL of 40 last November.  She is due for follow-up annual labs with her primary care and will have these drawn soon.  She remains on statin therapy.  5.  Morbid obesity: As above, we discussed the importance of regular exercise.  She is relatively sedentary.  I encouraged her to try and sneak in 5 minutes of exercise periodically throughout her day for total of 30 minutes daily.  6.  Disposition: Follow-up in 6 months or sooner if necessary.   Nicolasa Ducking, NP 04/16/2021, 8:10 AM

## 2021-04-28 ENCOUNTER — Encounter: Payer: Self-pay | Admitting: Nurse Practitioner

## 2021-04-28 ENCOUNTER — Telehealth (INDEPENDENT_AMBULATORY_CARE_PROVIDER_SITE_OTHER): Payer: 59 | Admitting: Nurse Practitioner

## 2021-04-28 VITALS — Temp 98.0°F | Ht 61.0 in | Wt 193.0 lb

## 2021-04-28 DIAGNOSIS — J069 Acute upper respiratory infection, unspecified: Secondary | ICD-10-CM | POA: Diagnosis not present

## 2021-04-28 DIAGNOSIS — J449 Chronic obstructive pulmonary disease, unspecified: Secondary | ICD-10-CM | POA: Diagnosis not present

## 2021-04-28 DIAGNOSIS — R051 Acute cough: Secondary | ICD-10-CM | POA: Diagnosis not present

## 2021-04-28 DIAGNOSIS — U071 COVID-19: Secondary | ICD-10-CM

## 2021-04-28 MED ORDER — BENZONATATE 100 MG PO CAPS: 100.0000 mg | ORAL_CAPSULE | Freq: Two times a day (BID) | ORAL | 0 refills | Status: DC | PRN

## 2021-04-28 MED ORDER — VENTOLIN HFA 108 (90 BASE) MCG/ACT IN AERS: 1.0000 | INHALATION_SPRAY | Freq: Every day | RESPIRATORY_TRACT | 3 refills | Status: DC

## 2021-04-28 MED ORDER — NIRMATRELVIR/RITONAVIR (PAXLOVID)TABLET: 3.0000 | ORAL_TABLET | Freq: Two times a day (BID) | ORAL | 0 refills | Status: AC

## 2021-04-28 NOTE — Progress Notes (Signed)
[facility] [address] [place], [place] [address]  Internal MEDICINE  Telephone Visit  Patient Name: Sherry Torres  [id number]  [id number]  Date of Service: 04/28/2021  I connected with the patient at 4:07 PM by telephone and verified the patients identity using two identifiers.   I discussed the limitations, risks, security and privacy concerns of performing an evaluation and management service by telephone and the availability of in person appointments. I also discussed with the patient that there may be a patient responsible charge related to the service.  The patient expressed understanding and agrees to proceed.    Chief Complaint  Patient presents with   Telephone Assessment     Tested covid positive    Telephone Screen   Sinusitis    Start Saturday night    Sore Throat   Cough   Headache    HPI Zya presents for a telehealth virtual visit for URI due to covid. Symptoms started on Saturday night. She tested positive for covid today. She reports sores throat, cough, headache. She had fatigue, and body aches as well.    Current Medication: Outpatient Encounter Medications as of 04/28/2021  Medication Sig   atorvastatin (LIPITOR) 80 MG tablet TAKE 1 TABLET BY MOUTH DAILY AT 6 PM.   benzonatate (TESSALON) 100 MG capsule Take 1 capsule (100 mg total) by mouth 2 (two) times daily as needed for cough.   bisoprolol (ZEBETA) 5 MG tablet TAKE 1 TABLET BY MOUTH EVERY DAY   buPROPion (WELLBUTRIN XL) 150 MG 24 hr tablet TAKE 1 TABLET BY MOUTH EVERY DAY   clopidogrel (PLAVIX) 75 MG tablet TAKE 1 TABLET BY MOUTH DAILY WITH BREAKFAST.   cyclobenzaprine (FLEXERIL) 5 MG tablet TAKE 1 TABLET BY MOUTH THREE TIMES A DAY AS NEEDED FOR MUSCLE SPASMS   furosemide (LASIX) 20 MG tablet TAKE 1 TABLET BY MOUTH EVERY DAY AS NEEDED   hydrochlorothiazide (HYDRODIURIL) 25 MG tablet TAKE 1 TABLET BY MOUTH EVERY DAY   isosorbide mononitrate (IMDUR) 60 MG 24 hr tablet TAKE 1 & 1/2  TABLETS (90 MG TOTAL) BY MOUTH IN THE MORNING AND 1 & 1/2 TAB AT BEDTIME.   JARDIANCE 10 MG TABS tablet TAKE 1 TABLET BY MOUTH EVERY DAY   metFORMIN (GLUCOPHAGE) 500 MG tablet Take 1 tablet (500 mg total) by mouth daily with breakfast.   nirmatrelvir/ritonavir EUA (PAXLOVID) 20 x 150 MG & 10 x 100MG  TABS Take 3 tablets by mouth 2 (two) times daily for 5 days.   nitroGLYCERIN (NITROSTAT) 0.4 MG SL tablet Place 1 tablet (0.4 mg total) under the tongue every 5 (five) minutes as needed for chest pain.   olmesartan (BENICAR) 5 MG tablet Take 1 tablet (5 mg total) by mouth daily.   [DISCONTINUED] VENTOLIN HFA 108 (90 Base) MCG/ACT inhaler Inhale 1-2 puffs into the lungs daily.   VENTOLIN HFA 108 (90 Base) MCG/ACT inhaler Inhale 1-2 puffs into the lungs daily.   No facility-administered encounter medications on file as of 04/28/2021.    Surgical History: Past Surgical History:  Procedure Laterality Date   CARDIAC CATHETERIZATION     LEFT HEART CATH AND CORONARY ANGIOGRAPHY N/A 01/06/2018   Procedure: LEFT HEART CATH AND CORONARY ANGIOGRAPHY;  Surgeon: 01/08/2018, MD;  Location: [facility] INVASIVE CV LAB;  Service: Cardiovascular;  Laterality: N/A;    Medical History: Past Medical History:  Diagnosis Date   Anxiety    Asthma    CAD (coronary artery disease)    a. 12/2017  NSTEMI;  b. 12/2017 MV: basal and mid inflat ischemia; c. 12/2017 Cath: LM nl, LAD 30ost, D1 mild dzs, LCX 40ost, 95d, RCA 32m (nondominant). LCX felt to be culprit but only 1.48mm vessel-->Med rx.   Chronic heart failure with preserved ejection fraction (HFpEF) (HCC)    a. 12/2017 Echo: EF 55-60%, no rwma, Gr1 DD; b. 01/2018 Echo: EF 60-65%, gr2 DD, no rwma, mild MR, nl RV fxn.   Hyperlipidemia LDL goal <70    Hypertension    Recurrent major depressive disorder, in partial remission (HCC) 09/17/2017   Tobacco abuse    Type 2 diabetes mellitus (HCC)     Family History: Family History  Problem Relation Age of Onset   Cancer  Mother    Breast cancer Mother 52   Hyperlipidemia Father     Social History   Socioeconomic History   Marital status: Married    Spouse name: Not on file   Number of children: Not on file   Years of education: Not on file   Highest education level: Not on file  Occupational History   Not on file  Tobacco Use   Smoking status: Former    Types: Cigarettes   Smokeless tobacco: Never  Vaping Use   Vaping Use: Former  Substance and Sexual Activity   Alcohol use: No   Drug use: No   Sexual activity: Not on file  Other Topics Concern   Not on file  Social History Narrative   Not on file   Social Determinants of Health   Financial Resource Strain: Not on file  Food Insecurity: Not on file  Transportation Needs: Not on file  Physical Activity: Not on file  Stress: Not on file  Social Connections: Not on file  Intimate Partner Violence: Not on file      Review of Systems  Constitutional:  Positive for chills, fatigue and fever.  HENT:  Positive for congestion, postnasal drip, sore throat and trouble swallowing. Negative for rhinorrhea, sinus pressure, sinus pain and sneezing.   Respiratory:  Positive for cough. Negative for choking, shortness of breath and wheezing.   Cardiovascular:  Negative for chest pain and palpitations.  Gastrointestinal:  Negative for abdominal pain, constipation, diarrhea, nausea and vomiting.  Musculoskeletal:  Positive for myalgias.  Neurological:  Positive for headaches. Negative for dizziness and light-headedness.   Vital Signs: Temp 98 F (36.7 C)   Ht 5\' 1"  (1.549 m)   Wt 193 lb (87.5 kg)   BMI 36.47 kg/m    Observation/Objective: She is alert and oriented and engages in conversation appropriately. She does not sound as though she is in any acute distress over telephone call.     Assessment/Plan: 1. Upper respiratory tract infection due to COVID-19 virus Paxlovid prescribed for covid infection.  - nirmatrelvir/ritonavir EUA  (PAXLOVID) 20 x 150 MG & 10 x 100MG  TABS; Take 3 tablets by mouth 2 (two) times daily for 5 days.  Dispense: 30 tablet; Refill: 0  2. COPD, mild (HCC) Refill ordered.  - VENTOLIN HFA 108 (90 Base) MCG/ACT inhaler; Inhale 1-2 puffs into the lungs daily.  Dispense: 18 g; Refill: 3  3. Acute cough Medication prescribed for symptomatic relief of cough.  - benzonatate (TESSALON) 100 MG capsule; Take 1 capsule (100 mg total) by mouth 2 (two) times daily as needed for cough.  Dispense: 30 capsule; Refill: 0   General Counseling: Renisha verbalizes understanding of the findings of today's phone visit and agrees with plan of treatment. I  have discussed any further diagnostic evaluation that may be needed or ordered today. We also reviewed her medications today. she has been encouraged to call the office with any questions or concerns that should arise related to todays visit.  Return if symptoms worsen or fail to improve.   No orders of the defined types were placed in this encounter.   Meds ordered this encounter  Medications   nirmatrelvir/ritonavir EUA (PAXLOVID) 20 x 150 MG & 10 x 100MG  TABS    Sig: Take 3 tablets by mouth 2 (two) times daily for 5 days.    Dispense:  30 tablet    Refill:  0   VENTOLIN HFA 108 (90 Base) MCG/ACT inhaler    Sig: Inhale 1-2 puffs into the lungs daily.    Dispense:  18 g    Refill:  3   benzonatate (TESSALON) 100 MG capsule    Sig: Take 1 capsule (100 mg total) by mouth 2 (two) times daily as needed for cough.    Dispense:  30 capsule    Refill:  0    Time spent:10 Minutes Time spent with patient included reviewing progress notes, labs, imaging studies, and discussing plan for follow up.  [place] Controlled Substance Database was reviewed by me for overdose risk score (ORS) if appropriate.  This patient was seen by , FNP-C in collaboration with Dr. Sallyanne Kuster as a part of collaborative care agreement.  Traeh Milroy R. Beverely Risen, MSN,  FNP-C Internal medicine

## 2021-05-05 ENCOUNTER — Telehealth: Payer: 59 | Admitting: Nurse Practitioner

## 2021-05-07 ENCOUNTER — Other Ambulatory Visit: Payer: Self-pay | Admitting: Internal Medicine

## 2021-05-07 DIAGNOSIS — I10 Essential (primary) hypertension: Secondary | ICD-10-CM

## 2021-05-09 ENCOUNTER — Other Ambulatory Visit: Payer: Self-pay

## 2021-05-09 ENCOUNTER — Other Ambulatory Visit: Payer: Self-pay | Admitting: Internal Medicine

## 2021-05-09 ENCOUNTER — Other Ambulatory Visit: Payer: Self-pay | Admitting: Nurse Practitioner

## 2021-05-09 DIAGNOSIS — J449 Chronic obstructive pulmonary disease, unspecified: Secondary | ICD-10-CM

## 2021-05-09 DIAGNOSIS — R051 Acute cough: Secondary | ICD-10-CM

## 2021-05-09 MED ORDER — VENTOLIN HFA 108 (90 BASE) MCG/ACT IN AERS: 1.0000 | INHALATION_SPRAY | Freq: Every day | RESPIRATORY_TRACT | 3 refills | Status: DC

## 2021-05-13 ENCOUNTER — Other Ambulatory Visit: Payer: Self-pay

## 2021-05-13 ENCOUNTER — Other Ambulatory Visit: Payer: Self-pay | Admitting: Nurse Practitioner

## 2021-05-13 DIAGNOSIS — R051 Acute cough: Secondary | ICD-10-CM

## 2021-05-13 DIAGNOSIS — J449 Chronic obstructive pulmonary disease, unspecified: Secondary | ICD-10-CM

## 2021-05-13 MED ORDER — VENTOLIN HFA 108 (90 BASE) MCG/ACT IN AERS: 1.0000 | INHALATION_SPRAY | Freq: Every day | RESPIRATORY_TRACT | 3 refills | Status: DC

## 2021-06-16 ENCOUNTER — Other Ambulatory Visit: Payer: Self-pay | Admitting: Internal Medicine

## 2021-06-16 ENCOUNTER — Other Ambulatory Visit: Payer: Self-pay | Admitting: Nurse Practitioner

## 2021-06-16 DIAGNOSIS — F411 Generalized anxiety disorder: Secondary | ICD-10-CM

## 2021-06-16 DIAGNOSIS — E1165 Type 2 diabetes mellitus with hyperglycemia: Secondary | ICD-10-CM

## 2021-07-10 ENCOUNTER — Other Ambulatory Visit: Payer: Self-pay | Admitting: Nurse Practitioner

## 2021-07-10 DIAGNOSIS — F411 Generalized anxiety disorder: Secondary | ICD-10-CM

## 2021-08-18 ENCOUNTER — Encounter: Payer: Self-pay | Admitting: Nurse Practitioner

## 2021-08-18 ENCOUNTER — Ambulatory Visit: Payer: 59 | Admitting: Nurse Practitioner

## 2021-08-18 VITALS — BP 130/60 | HR 65 | Temp 98.4°F | Resp 16 | Ht 62.0 in | Wt 192.4 lb

## 2021-08-18 DIAGNOSIS — E1165 Type 2 diabetes mellitus with hyperglycemia: Secondary | ICD-10-CM | POA: Diagnosis not present

## 2021-08-18 DIAGNOSIS — J4520 Mild intermittent asthma, uncomplicated: Secondary | ICD-10-CM | POA: Diagnosis not present

## 2021-08-18 DIAGNOSIS — I10 Essential (primary) hypertension: Secondary | ICD-10-CM | POA: Diagnosis not present

## 2021-08-18 DIAGNOSIS — J45909 Unspecified asthma, uncomplicated: Secondary | ICD-10-CM | POA: Diagnosis not present

## 2021-08-18 LAB — POCT GLYCOSYLATED HEMOGLOBIN (HGB A1C): Hemoglobin A1C: 6.4 % — AB (ref 4.0–5.6)

## 2021-08-18 MED ORDER — BUDESONIDE-FORMOTEROL FUMARATE 160-4.5 MCG/ACT IN AERO: 2.0000 | INHALATION_SPRAY | Freq: Two times a day (BID) | RESPIRATORY_TRACT | 3 refills | Status: DC

## 2021-08-18 MED ORDER — DAPAGLIFLOZIN PROPANEDIOL 10 MG PO TABS: 10.0000 mg | ORAL_TABLET | Freq: Every day | ORAL | 3 refills | Status: DC

## 2021-08-18 MED ORDER — ALBUTEROL SULFATE HFA 108 (90 BASE) MCG/ACT IN AERS
1.0000 | INHALATION_SPRAY | Freq: Every day | RESPIRATORY_TRACT | 3 refills | Status: DC
Start: 2021-08-18 — End: 2024-03-08

## 2021-08-18 NOTE — Progress Notes (Signed)
[facility] ?[address] ?[place], [place] [address] ? ?Internal MEDICINE  ?Office Visit Note ? ?Patient Name: Sherry Torres ? [id number] 15, 2062  ?[id number] ? ?Date of Service: 08/18/2021 ? ?Chief Complaint  ?Patient presents with  ? Follow-up  ? Diabetes  ? Anxiety  ? Depression  ? Hyperlipidemia  ? Hypertension  ? Asthma  ? ? ?HPI ?Sherry Torres presents for follow-up visit for diabetes, hypertension and is having a problem due to her change in insurance and her insurance is not covering Jardiance for her anymore.  She also has asthma rescue inhaler approximately 3 times a day.  She was previously tried on Xcel Energynoro and Trelegy but her insurance would not cover it in the past.  When checking medications that her insurance may cover, Symbicort was listed as a tier 1 medication so it may be possible treatment choice. ?Her A1C is 6.4 which is slightly elevated from 6.3 in august last year.  ? ? ? ?Current Medication: ?Outpatient Encounter Medications as of 08/18/2021  ?Medication Sig  ? atorvastatin (LIPITOR) 80 MG tablet TAKE 1 TABLET BY MOUTH DAILY AT 6 PM.  ? benzonatate (TESSALON) 100 MG capsule TAKE 1 CAPSULE BY MOUTH 2 TIMES DAILY AS NEEDED FOR COUGH.  ? bisoprolol (ZEBETA) 5 MG tablet TAKE 1 TABLET BY MOUTH EVERY DAY  ? budesonide-formoterol (SYMBICORT) 160-4.5 MCG/ACT inhaler Inhale 2 puffs into the lungs 2 (two) times daily.  ? buPROPion (WELLBUTRIN XL) 150 MG 24 hr tablet TAKE 1 TABLET BY MOUTH EVERY DAY  ? clopidogrel (PLAVIX) 75 MG tablet TAKE 1 TABLET BY MOUTH DAILY WITH BREAKFAST.  ? cyclobenzaprine (FLEXERIL) 5 MG tablet TAKE 1 TABLET BY MOUTH THREE TIMES A DAY AS NEEDED FOR MUSCLE SPASMS  ? dapagliflozin propanediol (FARXIGA) 10 MG TABS tablet Take 1 tablet (10 mg total) by mouth daily before breakfast.  ? furosemide (LASIX) 20 MG tablet TAKE 1 TABLET BY MOUTH EVERY DAY AS NEEDED  ? hydrochlorothiazide (HYDRODIURIL) 25 MG tablet TAKE 1 TABLET BY MOUTH EVERY DAY  ? isosorbide mononitrate (IMDUR) 60 MG 24  hr tablet TAKE 1 & 1/2 TABLETS (90 MG TOTAL) BY MOUTH IN THE MORNING AND 1 & 1/2 TAB AT BEDTIME.  ? metFORMIN (GLUCOPHAGE) 500 MG tablet Take 1 tablet (500 mg total) by mouth daily with breakfast.  ? nitroGLYCERIN (NITROSTAT) 0.4 MG SL tablet Place 1 tablet (0.4 mg total) under the tongue every 5 (five) minutes as needed for chest pain.  ? olmesartan (BENICAR) 5 MG tablet TAKE 1 TABLET (5 MG TOTAL) BY MOUTH DAILY.  ? [DISCONTINUED] VENTOLIN HFA 108 (90 Base) MCG/ACT inhaler Inhale 1-2 puffs into the lungs daily.  ? albuterol (VENTOLIN HFA) 108 (90 Base) MCG/ACT inhaler Inhale 1-2 puffs into the lungs daily.  ? [DISCONTINUED] JARDIANCE 10 MG TABS tablet TAKE 1 TABLET BY MOUTH EVERY DAY (Patient not taking: Reported on 08/18/2021)  ? ?No facility-administered encounter medications on file as of 08/18/2021.  ? ? ?Surgical History: ?Past Surgical History:  ?Procedure Laterality Date  ? CARDIAC CATHETERIZATION    ? LEFT HEART CATH AND CORONARY ANGIOGRAPHY N/A 01/06/2018  ? Procedure: LEFT HEART CATH AND CORONARY ANGIOGRAPHY;  Surgeon: Iran OuchArida, Muhammad A, MD;  Location: [facility] INVASIVE CV LAB;  Service: Cardiovascular;  Laterality: N/A;  ? ? ?Medical History: ?Past Medical History:  ?Diagnosis Date  ? Anxiety   ? Asthma   ? CAD (coronary artery disease)   ? a. 12/2017 NSTEMI;  b. 12/2017 MV: basal and mid inflat ischemia; c. 12/2017  Cath: LM nl, LAD 30ost, D1 mild dzs, LCX 40ost, 95d, RCA 62m (nondominant). LCX felt to be culprit but only 1.53mm vessel-->Med rx.  ? Chronic heart failure with preserved ejection fraction (HFpEF) (HCC)   ? a. 12/2017 Echo: EF 55-60%, no rwma, Gr1 DD; b. 01/2018 Echo: EF 60-65%, gr2 DD, no rwma, mild MR, nl RV fxn.  ? Hyperlipidemia LDL goal <70   ? Hypertension   ? Recurrent major depressive disorder, in partial remission (HCC) 09/17/2017  ? Tobacco abuse   ? Type 2 diabetes mellitus (HCC)   ? ? ?Family History: ?Family History  ?Problem Relation Age of Onset  ? Cancer Mother   ? Breast cancer Mother 106   ? Hyperlipidemia Father   ? ? ?Social History  ? ?Socioeconomic History  ? Marital status: Married  ?  Spouse name: Not on file  ? Number of children: Not on file  ? Years of education: Not on file  ? Highest education level: Not on file  ?Occupational History  ? Not on file  ?Tobacco Use  ? Smoking status: Former  ?  Types: Cigarettes  ? Smokeless tobacco: Never  ?Vaping Use  ? Vaping Use: Former  ?Substance and Sexual Activity  ? Alcohol use: No  ? Drug use: No  ? Sexual activity: Not on file  ?Other Topics Concern  ? Not on file  ?Social History Narrative  ? Not on file  ? ?Social Determinants of Health  ? ?Financial Resource Strain: Not on file  ?Food Insecurity: Not on file  ?Transportation Needs: Not on file  ?Physical Activity: Not on file  ?Stress: Not on file  ?Social Connections: Not on file  ?Intimate Partner Violence: Not on file  ? ? ? ? ?Review of Systems  ?Constitutional:  Negative for chills, fatigue and unexpected weight change.  ?HENT:  Negative for congestion, rhinorrhea, sneezing and sore throat.   ?Eyes:  Negative for redness.  ?Respiratory:  Negative for cough, chest tightness and shortness of breath.   ?Cardiovascular:  Negative for chest pain and palpitations.  ?Gastrointestinal:  Negative for abdominal pain, constipation, diarrhea, nausea and vomiting.  ?Genitourinary:  Negative for dysuria and frequency.  ?Musculoskeletal:  Negative for arthralgias, back pain, joint swelling and neck pain.  ?Skin:  Negative for rash.  ?Neurological: Negative.  Negative for tremors and numbness.  ?Hematological:  Negative for adenopathy. Does not bruise/bleed easily.  ?Psychiatric/Behavioral:  Negative for behavioral problems (Depression), sleep disturbance and suicidal ideas. The patient is not nervous/anxious.   ? ?Vital Signs: ?BP 130/60   Pulse 65   Temp 98.4 ?F (36.9 ?C)   Resp 16   Ht 5\' 2"  (1.575 m)   Wt 192 lb 6.4 oz (87.3 kg)   SpO2 98%   BMI 35.19 kg/m?  ? ? ?Physical Exam ?Vitals reviewed.   ?Constitutional:   ?   General: She is not in acute distress. ?   Appearance: Normal appearance. She is obese. She is not ill-appearing.  ?HENT:  ?   Head: Normocephalic and atraumatic.  ?Eyes:  ?   Pupils: Pupils are equal, round, and reactive to light.  ?Cardiovascular:  ?   Rate and Rhythm: Normal rate and regular rhythm.  ?Pulmonary:  ?   Effort: Pulmonary effort is normal. No respiratory distress.  ?Neurological:  ?   Mental Status: She is alert and oriented to person, place, and time.  ?Psychiatric:     ?   Mood and Affect: Mood normal.     ?  Behavior: Behavior normal.  ? ? ? ? ? ?Assessment/Plan: ?1. Uncontrolled type 2 diabetes mellitus with hyperglycemia (HCC) ?No significant change A1C is 6.4 today. [place] Pepper is no longer covered patient switched to farxiga 1 week sample provided and copay assistance information. Follow up in 3 months for repeat A1C. ?- POCT HgB A1C ?- dapagliflozin propanediol (FARXIGA) 10 MG TABS tablet; Take 1 tablet (10 mg total) by mouth daily before breakfast.  Dispense: 30 tablet; Refill: 3 ? ?2. Benign hypertension ?Stable with current medications no changes at this time.  ? ?3. Mild intermittent asthma without complication ?Symbicort prescribed, preferred as tier 1 by her insurance. Refills of albuterol rescue inhaler ordered.  ?- budesonide-formoterol (SYMBICORT) 160-4.5 MCG/ACT inhaler; Inhale 2 puffs into the lungs 2 (two) times daily.  Dispense: 1 each; Refill: 3 ?- albuterol (VENTOLIN HFA) 108 (90 Base) MCG/ACT inhaler; Inhale 1-2 puffs into the lungs daily.  Dispense: 18 g; Refill: 3 ? ?4. Asthma due to seasonal allergies ?Refills sent to pharmacy. ?- albuterol (VENTOLIN HFA) 108 (90 Base) MCG/ACT inhaler; Inhale 1-2 puffs into the lungs daily.  Dispense: 18 g; Refill: 3 ? ? ?General Counseling: Kesha verbalizes understanding of the findings of todays visit and agrees with plan of treatment. I have discussed any further diagnostic evaluation that may be needed or  ordered today. We also reviewed her medications today. she has been encouraged to call the office with any questions or concerns that should arise related to todays visit. ? ? ? ?Orders Placed This Encounter  ?Pro

## 2021-09-08 ENCOUNTER — Other Ambulatory Visit: Payer: Self-pay | Admitting: Internal Medicine

## 2021-09-08 DIAGNOSIS — E1165 Type 2 diabetes mellitus with hyperglycemia: Secondary | ICD-10-CM

## 2021-10-15 ENCOUNTER — Telehealth: Payer: Self-pay | Admitting: *Deleted

## 2021-10-15 ENCOUNTER — Encounter: Payer: Self-pay | Admitting: Internal Medicine

## 2021-10-15 ENCOUNTER — Other Ambulatory Visit
Admission: RE | Admit: 2021-10-15 | Discharge: 2021-10-15 | Disposition: A | Discharge status: Home / Self Care | Payer: 59 | Source: Ambulatory Visit | Attending: Internal Medicine | Admitting: Internal Medicine

## 2021-10-15 ENCOUNTER — Ambulatory Visit (INDEPENDENT_AMBULATORY_CARE_PROVIDER_SITE_OTHER): Payer: 59 | Admitting: Internal Medicine

## 2021-10-15 VITALS — BP 106/60 | HR 70 | Ht 61.5 in | Wt 190.0 lb

## 2021-10-15 DIAGNOSIS — E785 Hyperlipidemia, unspecified: Secondary | ICD-10-CM | POA: Insufficient documentation

## 2021-10-15 DIAGNOSIS — E1169 Type 2 diabetes mellitus with other specified complication: Secondary | ICD-10-CM | POA: Diagnosis not present

## 2021-10-15 DIAGNOSIS — I25118 Atherosclerotic heart disease of native coronary artery with other forms of angina pectoris: Secondary | ICD-10-CM | POA: Diagnosis not present

## 2021-10-15 DIAGNOSIS — R609 Edema, unspecified: Secondary | ICD-10-CM | POA: Diagnosis not present

## 2021-10-15 DIAGNOSIS — I10 Essential (primary) hypertension: Secondary | ICD-10-CM

## 2021-10-15 DIAGNOSIS — E876 Hypokalemia: Secondary | ICD-10-CM

## 2021-10-15 LAB — COMPREHENSIVE METABOLIC PANEL
ALT: 19 U/L (ref 0–44)
AST: 20 U/L (ref 15–41)
Albumin: 4 g/dL (ref 3.5–5.0)
Alkaline Phosphatase: 56 U/L (ref 38–126)
Anion gap: 10 (ref 5–15)
BUN: 17 mg/dL (ref 8–23)
CO2: 27 mmol/L (ref 22–32)
Calcium: 8.8 mg/dL — ABNORMAL LOW (ref 8.9–10.3)
Chloride: 101 mmol/L (ref 98–111)
Creatinine, Ser: 0.81 mg/dL (ref 0.44–1.00)
GFR, Estimated: >60 mL/min (ref >60)
Glucose, Bld: 149 mg/dL — ABNORMAL HIGH (ref 70–99)
Potassium: 3.1 mmol/L — ABNORMAL LOW (ref 3.5–5.1)
Sodium: 138 mmol/L (ref 135–145)
Total Bilirubin: 0.8 mg/dL (ref 0.3–1.2)
Total Protein: 7.5 g/dL (ref 6.5–8.1)

## 2021-10-15 LAB — CBC
HCT: 39.4 % (ref 36.0–46.0)
Hemoglobin: 12.7 g/dL (ref 12.0–15.0)
MCH: 27.4 pg (ref 26.0–34.0)
MCHC: 32.2 g/dL (ref 30.0–36.0)
MCV: 85.1 fL (ref 80.0–100.0)
Platelets: 242 10*3/uL (ref 150–400)
RBC: 4.63 MIL/uL (ref 3.87–5.11)
RDW: 15.7 % — ABNORMAL HIGH (ref 11.5–15.5)
WBC: 7.5 10*3/uL (ref 4.0–10.5)
nRBC: 0 % (ref 0.0–0.2)

## 2021-10-15 LAB — LIPID PANEL
Cholesterol: 103 mg/dL (ref 0–200)
HDL: 42 mg/dL (ref >40)
LDL Cholesterol: 38 mg/dL (ref 0–99)
Total CHOL/HDL Ratio: 2.5 RATIO
Triglycerides: 117 mg/dL (ref <150)
VLDL: 23 mg/dL (ref 0–40)

## 2021-10-15 MED ORDER — POTASSIUM CHLORIDE ER 20 MEQ PO TBCR: 20.0000 meq | EXTENDED_RELEASE_TABLET | Freq: Every day | ORAL | 0 refills | Status: DC

## 2021-10-15 NOTE — Patient Instructions (Signed)
Medication Instructions:   Your physician recommends that you continue on your current medications as directed. Please refer to the Current Medication list given to you today.  *If you need a refill on your cardiac medications before your next appointment, please call your pharmacy*   Lab Work:  [facility]: CBC, CMET, Lipid panel  -  Please go to the Medical Mall Entrance at [facility] -  Check in at the Registration Desk: 1st desk to the right, past the screening table  If you have labs (blood work) drawn today and your tests are completely normal, you will receive your results only by: MyChart Message (if you have MyChart) OR A paper copy in the mail If you have any lab test that is abnormal or we need to change your treatment, we will call you to review the results.   Testing/Procedures:  None ordered   Follow-Up: At [facility], you and your health needs are our priority.  As part of our continuing mission to provide you with exceptional heart care, we have created designated Provider Care Teams.  These Care Teams include your primary Cardiologist (physician) and Advanced Practice Providers (APPs -  Physician Assistants and Nurse Practitioners) who all work together to provide you with the care you need, when you need it.  We recommend signing up for the patient portal called "MyChart".  Sign up information is provided on this After Visit Summary.  MyChart is used to connect with patients for Virtual Visits (Telemedicine).  Patients are able to view lab/test results, encounter notes, upcoming appointments, etc.  Non-urgent messages can be sent to your provider as well.   To learn more about what you can do with MyChart, go to [email or web address].    Your next appointment:   1 year(s)  The format for your next appointment:   In Person  Provider:   You may see Yvonne Kendall, MD or one of the following Advanced Practice Providers on your designated  Care Team:   Nicolasa Ducking, NP Eula Listen, PA-C Cadence Fransico Michael, PA-C{   Important Information About Sugar

## 2021-10-15 NOTE — Telephone Encounter (Signed)
Spoke with pt. Notified of lab results and Dr. Serita Kyle recc.  Pt voiced understanding. Pt will:  -  START Potassium chloride 20 mEq daily     Rx sent to pt's pharmacy  - Repeat BMET in 2 weeks    Instructions given for medical mall   Orders placed   Pt has no further questions at this time.

## 2021-10-15 NOTE — Telephone Encounter (Signed)
-----   Message from Yvonne Kendall, MD sent at 10/15/2021  2:16 PM EDT ----- Please let Sherry Torres know that her labs are notable for a slightly low potassium level.  I recommend that we have her start taking potassium chloride 20 mEq daily and recheck a BMP in about 2 weeks.  Her blood counts are normal.  Her cholesterol is also well controlled.  She should continue taking the remainder of her medications as prescribed.

## 2021-10-15 NOTE — Progress Notes (Signed)
Follow-up Outpatient Visit Date: 10/15/2021  Primary Care Provider: Lyndon Code, MD [address] [place] [address]  Chief Complaint: Follow-up coronary artery disease  HPI:  Sherry Torres is a 61 y.o. female with history of coronary artery disease with high-grade disease involving distal LCx and nondominant RCA managed medically, hypertension, hyperlipidemia, diabetes mellitus, and anxiety, who presents for follow-up of coronary artery disease.  She was last seen in our office in 03/2021 by Ward Givens, NP, at which time she was doing well.  No medication changes or additional testing were pursued.  Today, Sherry Torres reports that she has been feeling fairly well.  She endorses occasional mild dependent edema for which she uses furosemide about every other day.  She denies chest pain, shortness of breath, palpitations, and lightheadedness.  She is tolerating her medications well.  She notes that her blood pressures are typically low normal, similar to today's reading.  --------------------------------------------------------------------------------------------------  Past Medical History:  Diagnosis Date   Anxiety    Asthma    CAD (coronary artery disease)    a. 12/2017 NSTEMI;  b. 12/2017 MV: basal and mid inflat ischemia; c. 12/2017 Cath: LM nl, LAD 30ost, D1 mild dzs, LCX 40ost, 95d, RCA 55m (nondominant). LCX felt to be culprit but only 1.23mm vessel-->Med rx.   Chronic heart failure with preserved ejection fraction (HFpEF) (HCC)    a. 12/2017 Echo: EF 55-60%, no rwma, Gr1 DD; b. 01/2018 Echo: EF 60-65%, gr2 DD, no rwma, mild MR, nl RV fxn.   Hyperlipidemia LDL goal <70    Hypertension    Recurrent major depressive disorder, in partial remission (HCC) 09/17/2017   Tobacco abuse    Type 2 diabetes mellitus (HCC)    Past Surgical History:  Procedure Laterality Date   CARDIAC CATHETERIZATION     LEFT HEART CATH AND CORONARY ANGIOGRAPHY N/A 01/06/2018   Procedure: LEFT HEART  CATH AND CORONARY ANGIOGRAPHY;  Surgeon: Iran Ouch, MD;  Location: [facility] INVASIVE CV LAB;  Service: Cardiovascular;  Laterality: N/A;    Current Meds  Medication Sig   albuterol (VENTOLIN HFA) 108 (90 Base) MCG/ACT inhaler Inhale 1-2 puffs into the lungs daily.   atorvastatin (LIPITOR) 80 MG tablet TAKE 1 TABLET BY MOUTH DAILY AT 6 PM.   benzonatate (TESSALON) 100 MG capsule TAKE 1 CAPSULE BY MOUTH 2 TIMES DAILY AS NEEDED FOR COUGH.   bisoprolol (ZEBETA) 5 MG tablet TAKE 1 TABLET BY MOUTH EVERY DAY   budesonide-formoterol (SYMBICORT) 160-4.5 MCG/ACT inhaler Inhale 2 puffs into the lungs 2 (two) times daily.   buPROPion (WELLBUTRIN XL) 150 MG 24 hr tablet TAKE 1 TABLET BY MOUTH EVERY DAY   clopidogrel (PLAVIX) 75 MG tablet TAKE 1 TABLET BY MOUTH DAILY WITH BREAKFAST.   cyclobenzaprine (FLEXERIL) 5 MG tablet TAKE 1 TABLET BY MOUTH THREE TIMES A DAY AS NEEDED FOR MUSCLE SPASMS   dapagliflozin propanediol (FARXIGA) 10 MG TABS tablet Take 1 tablet (10 mg total) by mouth daily before breakfast.   furosemide (LASIX) 20 MG tablet TAKE 1 TABLET BY MOUTH EVERY DAY AS NEEDED   hydrochlorothiazide (HYDRODIURIL) 25 MG tablet TAKE 1 TABLET BY MOUTH EVERY DAY   isosorbide mononitrate (IMDUR) 60 MG 24 hr tablet TAKE 1 & 1/2 TABLETS (90 MG TOTAL) BY MOUTH IN THE MORNING AND 1 & 1/2 TAB AT BEDTIME.   metFORMIN (GLUCOPHAGE) 500 MG tablet TAKE 1 TABLET BY MOUTH EVERY DAY WITH BREAKFAST   nitroGLYCERIN (NITROSTAT) 0.4 MG SL tablet Place 1 tablet (0.4  mg total) under the tongue every 5 (five) minutes as needed for chest pain.   olmesartan (BENICAR) 5 MG tablet TAKE 1 TABLET (5 MG TOTAL) BY MOUTH DAILY.    Allergies: Codeine  Social History   Tobacco Use   Smoking status: Former    Types: Cigarettes   Smokeless tobacco: Never  Vaping Use   Vaping Use: Former  Substance Use Topics   Alcohol use: No   Drug use: No    Family History  Problem Relation Age of Onset   Cancer Mother    Breast cancer  Mother 71   Hyperlipidemia Father     Review of Systems: A 12-system review of systems was performed and was negative except as noted in the HPI.  --------------------------------------------------------------------------------------------------  Physical Exam: BP 106/60 (BP Location: Left Arm, Patient Position: Sitting, Cuff Size: Large)   Pulse 70   Ht 5' 1.5" (1.562 m)   Wt 190 lb (86.2 kg)   SpO2 98%   BMI 35.32 kg/m   General:  NAD. Neck: No JVD or HJR. Lungs: Clear to auscultation bilaterally without wheezes or crackles. Heart: Regular rate and rhythm without murmurs, rubs, or gallops. Abdomen: Soft, nontender, nondistended. Extremities: No lower extremity edema.  EKG: Normal sinus rhythm with low voltage.  Otherwise, no significant abnormality.  Lab Results  Component Value Date   WBC 6.6 03/27/2020   HGB 11.3 03/27/2020   HCT 33.8 (L) 03/27/2020   MCV 86 03/27/2020   PLT 240 03/27/2020    Lab Results  Component Value Date   NA 139 03/27/2020   K 4.0 03/27/2020   CL 100 03/27/2020   CO2 24 03/27/2020   BUN 14 03/27/2020   CREATININE 0.71 03/27/2020   GLUCOSE 183 (H) 03/27/2020   ALT 12 03/27/2020    Lab Results  Component Value Date   CHOL 100 03/27/2020   HDL 39 (L) 03/27/2020   LDLCALC 40 03/27/2020   TRIG 116 03/27/2020   CHOLHDL 2.6 03/27/2020    --------------------------------------------------------------------------------------------------  ASSESSMENT AND PLAN: Coronary artery disease with stable angina: No chest pain or dyspnea reported.  Continue current antianginal regimen consisting of bisoprolol and isosorbide mononitrate.  Continue secondary prevention with atorvastatin and clopidogrel.  We will check a CBC, CMP, and lipid panel today.  Hypertension: Blood pressure low normal today, albeit without symptoms.  Home readings are typically similar.  We discussed de-escalation of her antihypertensive regimen but have agreed to defer any  changes today.  I will check a CMP today to ensure stable renal function and electrolytes.  Hyperlipidemia associated with type 2 diabetes mellitus: Lipids previously well controlled.  We will check a CMP and lipid panel today, with plans to continue atorvastatin 80 mg daily if lipids are at goal.  Ongoing management of diabetes mellitus per PCP.  Dependent edema: Chronic and stable.  I encouraged Sherry Torres to consider using compression stockings, especially when she is standing for extended periods.  It is reasonable for her to continue using as needed furosemide as well.  Morbid obesity: BMI remains greater than 35 with multiple comorbidities including type 2 diabetes mellitus, coronary artery disease, and hypertension.  Weight loss encouraged through diet and exercise.  Follow-up: Return to clinic in 1 year.  Yvonne Kendall, MD 10/15/2021 9:07 AM

## 2021-10-31 NOTE — Telephone Encounter (Signed)
Called pt to follow up re repeat BMET.  Pt states that she was unable to start potassium until the following week after I spoke with her d/t pharmacy did not have in stock. Pt started potassium the week of 6/5. Pt states she had planned to have repeat labs next week for this reason. Will follow up next week for pt's repeat BMET to reassess potassium level.   Pt denies any new cardiac symptoms.

## 2021-11-05 ENCOUNTER — Other Ambulatory Visit
Admission: RE | Admit: 2021-11-05 | Discharge: 2021-11-05 | Disposition: A | Discharge status: Home / Self Care | Payer: 59 | Attending: Internal Medicine | Admitting: Internal Medicine

## 2021-11-05 DIAGNOSIS — E876 Hypokalemia: Secondary | ICD-10-CM | POA: Insufficient documentation

## 2021-11-05 LAB — BASIC METABOLIC PANEL
Anion gap: 8 (ref 5–15)
BUN: 13 mg/dL (ref 8–23)
CO2: 28 mmol/L (ref 22–32)
Calcium: 9.5 mg/dL (ref 8.9–10.3)
Chloride: 104 mmol/L (ref 98–111)
Creatinine, Ser: 0.86 mg/dL (ref 0.44–1.00)
GFR, Estimated: >60 mL/min (ref >60)
Glucose, Bld: 184 mg/dL — ABNORMAL HIGH (ref 70–99)
Potassium: 3.7 mmol/L (ref 3.5–5.1)
Sodium: 140 mmol/L (ref 135–145)

## 2021-11-12 ENCOUNTER — Other Ambulatory Visit: Payer: Self-pay | Admitting: Internal Medicine

## 2021-11-12 DIAGNOSIS — I10 Essential (primary) hypertension: Secondary | ICD-10-CM

## 2021-11-13 ENCOUNTER — Other Ambulatory Visit: Payer: Self-pay | Admitting: Internal Medicine

## 2021-11-19 ENCOUNTER — Ambulatory Visit (INDEPENDENT_AMBULATORY_CARE_PROVIDER_SITE_OTHER): Payer: 59 | Admitting: Nurse Practitioner

## 2021-11-19 ENCOUNTER — Encounter: Payer: Self-pay | Admitting: Nurse Practitioner

## 2021-11-19 VITALS — BP 129/60 | HR 74 | Temp 98.3°F | Resp 16 | Ht 61.5 in | Wt 190.4 lb

## 2021-11-19 DIAGNOSIS — J4520 Mild intermittent asthma, uncomplicated: Secondary | ICD-10-CM | POA: Diagnosis not present

## 2021-11-19 DIAGNOSIS — R69 Illness, unspecified: Secondary | ICD-10-CM | POA: Diagnosis not present

## 2021-11-19 DIAGNOSIS — I10 Essential (primary) hypertension: Secondary | ICD-10-CM | POA: Diagnosis not present

## 2021-11-19 DIAGNOSIS — M62830 Muscle spasm of back: Secondary | ICD-10-CM | POA: Diagnosis not present

## 2021-11-19 DIAGNOSIS — I2510 Atherosclerotic heart disease of native coronary artery without angina pectoris: Secondary | ICD-10-CM

## 2021-11-19 DIAGNOSIS — F411 Generalized anxiety disorder: Secondary | ICD-10-CM

## 2021-11-19 DIAGNOSIS — E1165 Type 2 diabetes mellitus with hyperglycemia: Secondary | ICD-10-CM

## 2021-11-19 LAB — POCT GLYCOSYLATED HEMOGLOBIN (HGB A1C): Hemoglobin A1C: 6.3 % — AB (ref 4.0–5.6)

## 2021-11-19 MED ORDER — ATORVASTATIN CALCIUM 80 MG PO TABS: ORAL_TABLET | ORAL | 3 refills | Status: DC

## 2021-11-19 MED ORDER — BISOPROLOL FUMARATE 5 MG PO TABS: 5.0000 mg | ORAL_TABLET | Freq: Every day | ORAL | 3 refills | Status: DC

## 2021-11-19 MED ORDER — DAPAGLIFLOZIN PROPANEDIOL 10 MG PO TABS: 10.0000 mg | ORAL_TABLET | Freq: Every day | ORAL | 3 refills | Status: DC

## 2021-11-19 MED ORDER — CLOPIDOGREL BISULFATE 75 MG PO TABS: 75.0000 mg | ORAL_TABLET | Freq: Every day | ORAL | 3 refills | Status: DC

## 2021-11-19 MED ORDER — HYDROCHLOROTHIAZIDE 25 MG PO TABS: 25.0000 mg | ORAL_TABLET | Freq: Every day | ORAL | 3 refills | Status: DC

## 2021-11-19 MED ORDER — BUPROPION HCL ER (XL) 150 MG PO TB24: 150.0000 mg | ORAL_TABLET | Freq: Every day | ORAL | 1 refills | Status: DC

## 2021-11-19 MED ORDER — BUDESONIDE-FORMOTEROL FUMARATE 160-4.5 MCG/ACT IN AERO: 2.0000 | INHALATION_SPRAY | Freq: Two times a day (BID) | RESPIRATORY_TRACT | 3 refills | Status: DC

## 2021-11-19 MED ORDER — CYCLOBENZAPRINE HCL 5 MG PO TABS: ORAL_TABLET | ORAL | 1 refills | Status: DC

## 2021-11-19 MED ORDER — METFORMIN HCL 500 MG PO TABS: ORAL_TABLET | ORAL | 1 refills | Status: DC

## 2021-11-19 NOTE — Progress Notes (Signed)
[facility] [address] [place], [place] [address]  Internal MEDICINE  Office Visit Note  Patient Name: Sherry Torres  [id number]  [id number]  Date of Service: 11/19/2021  Chief Complaint  Patient presents with   Follow-up   Depression   Diabetes   Hyperlipidemia   Hypertension   Asthma   Anxiety    HPI Sherry Torres presents for a follow-up visit for hypertension, hyperlipidemia, diabetes and anxiety and depression.  Her A1c continues to improve.  Her A1c was 6.3 today which is slightly improved from 6.4 in April.  Her blood pressure is stable well-controlled with current medications she is stopped taking furosemide regularly and will only take it as needed for lower extremity edema. She is currently taking bupropion 150 mg daily this medication seems to be effective in controlling her anxiety and depressive symptoms.  She has increased stress at work and is frustrated with her coworkers.  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  She states that they will be going back to school soon so she feels like she will be able to relax a bit more when that happens. --Discussed healthy self-care interventions and taking time for oneself, taking time to rest and relax. Patient states that she will be going on vacation in September.   Current Medication: Outpatient Encounter Medications as of 11/19/2021  Medication Sig   albuterol (VENTOLIN HFA) 108 (90 Base) MCG/ACT inhaler Inhale 1-2 puffs into the lungs daily.   benzonatate (TESSALON) 100 MG capsule TAKE 1 CAPSULE BY MOUTH 2 TIMES DAILY AS NEEDED FOR COUGH.   furosemide (LASIX) 20 MG tablet TAKE 1 TABLET BY MOUTH EVERY DAY AS NEEDED   isosorbide mononitrate (IMDUR) 60 MG 24 hr tablet TAKE 1 & 1/2 TABLETS (90 MG TOTAL) BY MOUTH IN THE MORNING AND 1 & 1/2 TAB AT BEDTIME.   nitroGLYCERIN (NITROSTAT) 0.4 MG SL  tablet Place 1 tablet (0.4 mg total) under the tongue every 5 (five) minutes as needed for chest pain.   olmesartan (BENICAR) 5 MG tablet TAKE 1 TABLET (5 MG TOTAL) BY MOUTH DAILY.   Potassium Chloride ER 20 MEQ TBCR TAKE 1 TABLET BY MOUTH EVERY DAY   [DISCONTINUED] atorvastatin (LIPITOR) 80 MG tablet TAKE 1 TABLET BY MOUTH DAILY AT 6 PM.   [DISCONTINUED] bisoprolol (ZEBETA) 5 MG tablet TAKE 1 TABLET BY MOUTH EVERY DAY   [DISCONTINUED] budesonide-formoterol (SYMBICORT) 160-4.5 MCG/ACT inhaler Inhale 2 puffs into the lungs 2 (two) times daily.   [DISCONTINUED] buPROPion (WELLBUTRIN XL) 150 MG 24 hr tablet TAKE 1 TABLET BY MOUTH EVERY DAY   [DISCONTINUED] clopidogrel (PLAVIX) 75 MG tablet TAKE 1 TABLET BY MOUTH DAILY WITH BREAKFAST.   [DISCONTINUED] cyclobenzaprine (FLEXERIL) 5 MG tablet TAKE 1 TABLET BY MOUTH THREE TIMES A DAY AS NEEDED FOR MUSCLE SPASMS   [DISCONTINUED] dapagliflozin propanediol (FARXIGA) 10 MG TABS tablet Take 1 tablet (10 mg total) by mouth daily before breakfast.   [DISCONTINUED] hydrochlorothiazide (HYDRODIURIL) 25 MG tablet TAKE 1 TABLET BY MOUTH EVERY DAY   [DISCONTINUED] metFORMIN (GLUCOPHAGE) 500 MG tablet TAKE 1 TABLET BY MOUTH EVERY DAY WITH BREAKFAST   atorvastatin (LIPITOR) 80 MG tablet TAKE 1 TABLET BY MOUTH DAILY AT 6 PM.   bisoprolol (ZEBETA) 5 MG tablet Take 1 tablet (5 mg total) by mouth daily.   budesonide-formoterol (SYMBICORT) 160-4.5 MCG/ACT inhaler Inhale 2 puffs into  the lungs 2 (two) times daily.   buPROPion (WELLBUTRIN XL) 150 MG 24 hr tablet Take 1 tablet (150 mg total) by mouth daily.   clopidogrel (PLAVIX) 75 MG tablet Take 1 tablet (75 mg total) by mouth daily with breakfast.   cyclobenzaprine (FLEXERIL) 5 MG tablet TAKE 1 TABLET BY MOUTH THREE TIMES A DAY AS NEEDED FOR MUSCLE SPASMS   dapagliflozin propanediol (FARXIGA) 10 MG TABS tablet Take 1 tablet (10 mg total) by mouth daily before breakfast.   hydrochlorothiazide (HYDRODIURIL) 25 MG tablet Take  1 tablet (25 mg total) by mouth daily.   metFORMIN (GLUCOPHAGE) 500 MG tablet TAKE 1 TABLET BY MOUTH EVERY DAY WITH BREAKFAST   No facility-administered encounter medications on file as of 11/19/2021.    Surgical History: Past Surgical History:  Procedure Laterality Date   CARDIAC CATHETERIZATION     LEFT HEART CATH AND CORONARY ANGIOGRAPHY N/A 01/06/2018   Procedure: LEFT HEART CATH AND CORONARY ANGIOGRAPHY;  Surgeon: Iran Ouch, MD;  Location: [facility] INVASIVE CV LAB;  Service: Cardiovascular;  Laterality: N/A;    Medical History: Past Medical History:  Diagnosis Date   Anxiety    Asthma    CAD (coronary artery disease)    a. 12/2017 NSTEMI;  b. 12/2017 MV: basal and mid inflat ischemia; c. 12/2017 Cath: LM nl, LAD 30ost, D1 mild dzs, LCX 40ost, 95d, RCA 96m (nondominant). LCX felt to be culprit but only 1.51mm vessel-->Med rx.   Chronic heart failure with preserved ejection fraction (HFpEF) (HCC)    a. 12/2017 Echo: EF 55-60%, no rwma, Gr1 DD; b. 01/2018 Echo: EF 60-65%, gr2 DD, no rwma, mild MR, nl RV fxn.   Hyperlipidemia LDL goal <70    Hypertension    Recurrent major depressive disorder, in partial remission (HCC) 09/17/2017   Tobacco abuse    Type 2 diabetes mellitus (HCC)     Family History: Family History  Problem Relation Age of Onset   Cancer Mother    Breast cancer Mother 41   Hyperlipidemia Father     Social History   Socioeconomic History   Marital status: Married    Spouse name: Not on file   Number of children: Not on file   Years of education: Not on file   Highest education level: Not on file  Occupational History   Not on file  Tobacco Use   Smoking status: Former    Types: Cigarettes   Smokeless tobacco: Never  Vaping Use   Vaping Use: Former  Substance and Sexual Activity   Alcohol use: No   Drug use: No   Sexual activity: Not on file  Other Topics Concern   Not on file  Social History Narrative   Not on file   Social Determinants of  Health   Financial Resource Strain: Not on file  Food Insecurity: Not on file  Transportation Needs: Not on file  Physical Activity: Not on file  Stress: Not on file  Social Connections: Not on file  Intimate Partner Violence: Not on file      Review of Systems  Constitutional:  Negative for chills, fatigue and unexpected weight change.  HENT:  Negative for congestion, rhinorrhea, sneezing and sore throat.   Eyes:  Negative for redness.  Respiratory:  Negative for cough, chest tightness and shortness of breath.   Cardiovascular:  Negative for chest pain and palpitations.  Gastrointestinal:  Negative for abdominal pain, constipation, diarrhea, nausea and vomiting.  Genitourinary:  Negative for dysuria and frequency.  Musculoskeletal:  Negative for arthralgias, back pain, joint swelling and neck pain.  Skin:  Negative for rash.  Neurological: Negative.  Negative for tremors and numbness.  Hematological:  Negative for adenopathy. Does not bruise/bleed easily.  Psychiatric/Behavioral:  Negative for behavioral problems (Depression), sleep disturbance and suicidal ideas. The patient is not nervous/anxious.     Vital Signs: BP 129/60   Pulse 74   Temp 98.3 F (36.8 C)   Resp 16   Ht 5' 1.5" (1.562 m)   Wt 190 lb 6.4 oz (86.4 kg)   SpO2 98%   BMI 35.39 kg/m    Physical Exam Vitals reviewed.  Constitutional:      Appearance: Normal appearance.  HENT:     Head: Normocephalic and atraumatic.  Eyes:     Pupils: Pupils are equal, round, and reactive to light.  Cardiovascular:     Rate and Rhythm: Normal rate and regular rhythm.  Pulmonary:     Effort: Pulmonary effort is normal.  Neurological:     Mental Status: She is alert and oriented to person, place, and time.  Psychiatric:        Mood and Affect: Mood normal.        Behavior: Behavior normal.        Assessment/Plan: 1. Uncontrolled type 2 diabetes mellitus with hyperglycemia (HCC) Slight improvement in A1C,  currently 6.3, continue current medications as prescribed, no changes, refills ordered.  - POCT HgB A1C - dapagliflozin propanediol (FARXIGA) 10 MG TABS tablet; Take 1 tablet (10 mg total) by mouth daily before breakfast.  Dispense: 90 tablet; Refill: 3 - metFORMIN (GLUCOPHAGE) 500 MG tablet; TAKE 1 TABLET BY MOUTH EVERY DAY WITH BREAKFAST  Dispense: 90 tablet; Refill: 1  2. Essential hypertension BP is stable and well controlled, continue medication as prescribed, refills ordered.  - bisoprolol (ZEBETA) 5 MG tablet; Take 1 tablet (5 mg total) by mouth daily.  Dispense: 90 tablet; Refill: 3 - hydrochlorothiazide (HYDRODIURIL) 25 MG tablet; Take 1 tablet (25 mg total) by mouth daily.  Dispense: 90 tablet; Refill: 3  3. Coronary artery disease involving native coronary artery of native heart without angina pectoris Stable, refills ordered - clopidogrel (PLAVIX) 75 MG tablet; Take 1 tablet (75 mg total) by mouth daily with breakfast.  Dispense: 90 tablet; Refill: 3 - atorvastatin (LIPITOR) 80 MG tablet; TAKE 1 TABLET BY MOUTH DAILY AT 6 PM.  Dispense: 90 tablet; Refill: 3  4. Mild intermittent asthma without complication Stable, no significant change in respiratory status, refills ordered - budesonide-formoterol (SYMBICORT) 160-4.5 MCG/ACT inhaler; Inhale 2 puffs into the lungs 2 (two) times daily.  Dispense: 1 each; Refill: 3  5. Muscle spasm of back Stable, refills ordered - cyclobenzaprine (FLEXERIL) 5 MG tablet; TAKE 1 TABLET BY MOUTH THREE TIMES A DAY AS NEEDED FOR MUSCLE SPASMS  Dispense: 90 tablet; Refill: 1  6. GAD (generalized anxiety disorder) Stable with current medication. - buPROPion (WELLBUTRIN XL) 150 MG 24 hr tablet; Take 1 tablet (150 mg total) by mouth daily.  Dispense: 90 tablet; Refill: 1   General Counseling: Layah verbalizes understanding of the findings of todays visit and agrees with plan of treatment. I have discussed any further diagnostic evaluation that may be  needed or ordered today. We also reviewed her medications today. she has been encouraged to call the office with any questions or concerns that should arise related to todays visit.    Orders Placed This Encounter  Procedures   POCT HgB A1C  Meds ordered this encounter  Medications   dapagliflozin propanediol (FARXIGA) 10 MG TABS tablet    Sig: Take 1 tablet (10 mg total) by mouth daily before breakfast.    Dispense:  90 tablet    Refill:  3   clopidogrel (PLAVIX) 75 MG tablet    Sig: Take 1 tablet (75 mg total) by mouth daily with breakfast.    Dispense:  90 tablet    Refill:  3    For future refills   cyclobenzaprine (FLEXERIL) 5 MG tablet    Sig: TAKE 1 TABLET BY MOUTH THREE TIMES A DAY AS NEEDED FOR MUSCLE SPASMS    Dispense:  90 tablet    Refill:  1    For future refills   bisoprolol (ZEBETA) 5 MG tablet    Sig: Take 1 tablet (5 mg total) by mouth daily.    Dispense:  90 tablet    Refill:  3    For future refills   buPROPion (WELLBUTRIN XL) 150 MG 24 hr tablet    Sig: Take 1 tablet (150 mg total) by mouth daily.    Dispense:  90 tablet    Refill:  1    For future refills   hydrochlorothiazide (HYDRODIURIL) 25 MG tablet    Sig: Take 1 tablet (25 mg total) by mouth daily.    Dispense:  90 tablet    Refill:  3    For future refills   budesonide-formoterol (SYMBICORT) 160-4.5 MCG/ACT inhaler    Sig: Inhale 2 puffs into the lungs 2 (two) times daily.    Dispense:  1 each    Refill:  3   atorvastatin (LIPITOR) 80 MG tablet    Sig: TAKE 1 TABLET BY MOUTH DAILY AT 6 PM.    Dispense:  90 tablet    Refill:  3    For future refills   metFORMIN (GLUCOPHAGE) 500 MG tablet    Sig: TAKE 1 TABLET BY MOUTH EVERY DAY WITH BREAKFAST    Dispense:  90 tablet    Refill:  1    Return in about 3 months (around 02/19/2022) for F/U, Recheck A1C, Haygen Zebrowski PCP.   Total time spent:30 Minutes Time spent includes review of chart, medications, test results, and follow up plan with the  patient.   [place] Controlled Substance Database was reviewed by me.  This patient was seen by Sallyanne Kuster, FNP-C in collaboration with Dr. Beverely Risen as a part of collaborative care agreement.   Hollee Fate R. Tedd Sias, MSN, FNP-C Internal medicine

## 2021-11-20 ENCOUNTER — Encounter: Payer: Self-pay | Admitting: Nurse Practitioner

## 2021-11-23 ENCOUNTER — Other Ambulatory Visit: Payer: Self-pay | Admitting: Internal Medicine

## 2021-11-26 ENCOUNTER — Telehealth: Payer: Self-pay

## 2021-12-03 NOTE — Telephone Encounter (Signed)
Spoke to pt, the pharmacy has corrected things and pt is now able to pick up farxiga at $29 so pt will be picking it up this morning. Pt is also taking metformin.

## 2021-12-31 ENCOUNTER — Telehealth: Payer: Self-pay

## 2021-12-31 ENCOUNTER — Encounter: Payer: 59 | Admitting: Nurse Practitioner

## 2021-12-31 NOTE — Telephone Encounter (Signed)
Left vm and sent mychart message to confirm 01/07/22 appointment-Toni 

## 2022-01-07 ENCOUNTER — Encounter: Payer: Self-pay | Admitting: Nurse Practitioner

## 2022-02-18 ENCOUNTER — Ambulatory Visit: Payer: 59 | Admitting: Nurse Practitioner

## 2022-02-19 ENCOUNTER — Telehealth: Payer: Self-pay | Admitting: Nurse Practitioner

## 2022-02-19 NOTE — Telephone Encounter (Signed)
Left vm and sent mychart message to confirm 02/25/22 appointment-Toni 

## 2022-02-25 ENCOUNTER — Ambulatory Visit (INDEPENDENT_AMBULATORY_CARE_PROVIDER_SITE_OTHER): Payer: 59 | Admitting: Nurse Practitioner

## 2022-02-25 ENCOUNTER — Encounter: Payer: Self-pay | Admitting: Nurse Practitioner

## 2022-02-25 VITALS — BP 129/57 | HR 66 | Temp 95.9°F | Resp 16 | Ht 61.5 in | Wt 190.6 lb

## 2022-02-25 DIAGNOSIS — R300 Dysuria: Secondary | ICD-10-CM

## 2022-02-25 DIAGNOSIS — F411 Generalized anxiety disorder: Secondary | ICD-10-CM

## 2022-02-25 DIAGNOSIS — Z1231 Encounter for screening mammogram for malignant neoplasm of breast: Secondary | ICD-10-CM

## 2022-02-25 DIAGNOSIS — Z1211 Encounter for screening for malignant neoplasm of colon: Secondary | ICD-10-CM

## 2022-02-25 DIAGNOSIS — Z0001 Encounter for general adult medical examination with abnormal findings: Secondary | ICD-10-CM | POA: Diagnosis not present

## 2022-02-25 DIAGNOSIS — Z1212 Encounter for screening for malignant neoplasm of rectum: Secondary | ICD-10-CM

## 2022-02-25 DIAGNOSIS — E1165 Type 2 diabetes mellitus with hyperglycemia: Secondary | ICD-10-CM | POA: Diagnosis not present

## 2022-02-25 LAB — POCT GLYCOSYLATED HEMOGLOBIN (HGB A1C): Hemoglobin A1C: 6.4 % — AB (ref 4.0–5.6)

## 2022-02-25 MED ORDER — ESCITALOPRAM OXALATE 5 MG PO TABS: 5.0000 mg | ORAL_TABLET | Freq: Every day | ORAL | 1 refills | Status: DC

## 2022-02-25 NOTE — Progress Notes (Signed)
[facility] [address] [place], [place] [address]  Internal MEDICINE  Office Visit Note  Patient Name: Sherry Torres  [id number]  [id number]  Date of Service: 02/25/2022  Chief Complaint  Patient presents with   Annual Exam   Diabetes   Depression   Hyperlipidemia   Hypertension    HPI Sherry Torres presents for an annual well visit and physical exam.  Well-appearing 61 year old female with hypertension, CAD, chronic heart failure, mild intermittent asthma, type 2 diabetes, osteoarthritis, anxiety and depression. --Due for routine mammogram -- Due for colorectal cancer screening, prefers Cologuard test -- Due for diabetic eye exam -- Foot exam done today A1c remains stable at 6.4, no significant change --Patient notes increased libido, unsure if this is a side effect of medication but states she has had no changes in her medications recently.  States her mood has been up and she has been feeling well and happy. Several Labs done in may and June.  Defer further labs for now -- Feels like her anxiety is high and wants to try a low-dose of escitalopram again.    Current Medication: Outpatient Encounter Medications as of 02/25/2022  Medication Sig   albuterol (VENTOLIN HFA) 108 (90 Base) MCG/ACT inhaler Inhale 1-2 puffs into the lungs daily.   atorvastatin (LIPITOR) 80 MG tablet TAKE 1 TABLET BY MOUTH DAILY AT 6 PM.   benzonatate (TESSALON) 100 MG capsule TAKE 1 CAPSULE BY MOUTH 2 TIMES DAILY AS NEEDED FOR COUGH.   bisoprolol (ZEBETA) 5 MG tablet Take 1 tablet (5 mg total) by mouth daily.   budesonide-formoterol (SYMBICORT) 160-4.5 MCG/ACT inhaler Inhale 2 puffs into the lungs 2 (two) times daily.   buPROPion (WELLBUTRIN XL) 150 MG 24 hr tablet Take 1 tablet (150 mg total) by mouth daily.   clopidogrel (PLAVIX) 75 MG tablet Take 1 tablet (75 mg total) by mouth daily with breakfast.   cyclobenzaprine (FLEXERIL) 5 MG tablet TAKE 1 TABLET BY MOUTH THREE TIMES A DAY  AS NEEDED FOR MUSCLE SPASMS   dapagliflozin propanediol (FARXIGA) 10 MG TABS tablet Take 1 tablet (10 mg total) by mouth daily before breakfast.   escitalopram (LEXAPRO) 5 MG tablet Take 1 tablet (5 mg total) by mouth daily.   furosemide (LASIX) 20 MG tablet TAKE 1 TABLET BY MOUTH EVERY DAY AS NEEDED   hydrochlorothiazide (HYDRODIURIL) 25 MG tablet Take 1 tablet (25 mg total) by mouth daily.   isosorbide mononitrate (IMDUR) 60 MG 24 hr tablet TAKE 1 & 1/2 TABLETS (90 MG TOTAL) BY MOUTH IN THE MORNING AND 1 & 1/2 TAB AT BEDTIME.   metFORMIN (GLUCOPHAGE) 500 MG tablet TAKE 1 TABLET BY MOUTH EVERY DAY WITH BREAKFAST   nitroGLYCERIN (NITROSTAT) 0.4 MG SL tablet Place 1 tablet (0.4 mg total) under the tongue every 5 (five) minutes as needed for chest pain.   olmesartan (BENICAR) 5 MG tablet TAKE 1 TABLET (5 MG TOTAL) BY MOUTH DAILY.   Potassium Chloride ER 20 MEQ TBCR TAKE 1 TABLET BY MOUTH EVERY DAY   No facility-administered encounter medications on file as of 02/25/2022.    Surgical History: Past Surgical History:  Procedure Laterality Date   CARDIAC CATHETERIZATION     LEFT HEART CATH AND CORONARY ANGIOGRAPHY N/A 01/06/2018   Procedure: LEFT HEART CATH AND CORONARY ANGIOGRAPHY;  Surgeon: [place] Ouch, MD;  Location: [facility] INVASIVE CV LAB;  Service: Cardiovascular;  Laterality: N/A;    Medical History: Past Medical History:  Diagnosis Date   Anxiety  Asthma    CAD (coronary artery disease)    a. 12/2017 NSTEMI;  b. 12/2017 MV: basal and mid inflat ischemia; c. 12/2017 Cath: LM nl, LAD 30ost, D1 mild dzs, LCX 40ost, 95d, RCA 56m (nondominant). LCX felt to be culprit but only 1.96mm vessel-->Med rx.   Chronic heart failure with preserved ejection fraction (HFpEF) (HCC)    a. 12/2017 Echo: EF 55-60%, no rwma, Gr1 DD; b. 01/2018 Echo: EF 60-65%, gr2 DD, no rwma, mild MR, nl RV fxn.   Hyperlipidemia LDL goal <70    Hypertension    Recurrent major depressive disorder, in partial remission  (HCC) 09/17/2017   Tobacco abuse    Type 2 diabetes mellitus (HCC)     Family History: Family History  Problem Relation Age of Onset   Cancer Mother    Breast cancer Mother 29   Hyperlipidemia Father     Social History   Socioeconomic History   Marital status: Married    Spouse name: Not on file   Number of children: Not on file   Years of education: Not on file   Highest education level: Not on file  Occupational History   Not on file  Tobacco Use   Smoking status: Former    Types: Cigarettes   Smokeless tobacco: Never  Vaping Use   Vaping Use: Former  Substance and Sexual Activity   Alcohol use: No   Drug use: No   Sexual activity: Not on file  Other Topics Concern   Not on file  Social History Narrative   Not on file   Social Determinants of Health   Financial Resource Strain: Not on file  Food Insecurity: Not on file  Transportation Needs: Not on file  Physical Activity: Not on file  Stress: Not on file  Social Connections: Not on file  Intimate Partner Violence: Not on file      Review of Systems  Constitutional:  Negative for activity change, appetite change, chills, fatigue, fever and unexpected weight change.  HENT: Negative.  Negative for congestion, ear pain, rhinorrhea, sore throat and trouble swallowing.   Eyes: Negative.   Respiratory: Negative.  Negative for cough, chest tightness, shortness of breath and wheezing.   Cardiovascular: Negative.  Negative for chest pain.  Gastrointestinal: Negative.  Negative for abdominal pain, blood in stool, constipation, diarrhea, nausea and vomiting.  Endocrine: Negative.   Genitourinary: Negative.  Negative for difficulty urinating, dysuria, frequency, hematuria and urgency.  Musculoskeletal: Negative.  Negative for arthralgias, back pain, joint swelling, myalgias and neck pain.  Skin: Negative.  Negative for rash and wound.  Allergic/Immunologic: Negative.  Negative for immunocompromised state.   Neurological: Negative.  Negative for dizziness, seizures, numbness and headaches.  Hematological: Negative.   Psychiatric/Behavioral:  Positive for depression and sleep disturbance. Negative for behavioral problems, self-injury and suicidal ideas. The patient is nervous/anxious.     Vital Signs: BP (!) 129/57   Pulse 66   Temp (!) 95.9 F (35.5 C)   Resp 16   Ht 5' 1.5" (1.562 m)   Wt 190 lb 9.6 oz (86.5 kg)   SpO2 97%   BMI 35.43 kg/m    Physical Exam Vitals reviewed.  Constitutional:      General: She is not in acute distress.    Appearance: Normal appearance. She is well-developed. She is obese. She is not ill-appearing or diaphoretic.  HENT:     Head: Normocephalic and atraumatic.     Right Ear: Tympanic membrane, ear canal and  external ear normal.     Left Ear: Tympanic membrane, ear canal and external ear normal.     Nose: Nose normal. No congestion or rhinorrhea.     Mouth/Throat:     Mouth: Mucous membranes are moist.     Pharynx: Oropharynx is clear. No oropharyngeal exudate or posterior oropharyngeal erythema.  Eyes:     General: No scleral icterus.       Right eye: No discharge.        Left eye: No discharge.     Conjunctiva/sclera: Conjunctivae normal.     Pupils: Pupils are equal, round, and reactive to light.  Neck:     Thyroid: No thyromegaly.     Vascular: No carotid bruit or JVD.     Trachea: No tracheal deviation.  Cardiovascular:     Rate and Rhythm: Normal rate and regular rhythm.     Pulses: Normal pulses.          Dorsalis pedis pulses are 2+ on the right side and 2+ on the left side.       Posterior tibial pulses are 2+ on the right side and 2+ on the left side.     Heart sounds: Normal heart sounds. No murmur heard.    No friction rub. No gallop.  Pulmonary:     Effort: Pulmonary effort is normal. No respiratory distress.     Breath sounds: Normal breath sounds. No stridor. No wheezing or rales.  Chest:     Chest wall: No tenderness.      Comments: Declined clinical breast exam, mammogram ordered today Abdominal:     General: Bowel sounds are normal. There is no distension.     Palpations: Abdomen is soft. There is no mass.     Tenderness: There is no abdominal tenderness. There is no guarding or rebound.  Musculoskeletal:        General: No tenderness or deformity. Normal range of motion.     Cervical back: Normal range of motion and neck supple.  Feet:     Right foot:     Protective Sensation: 6 sites tested.  6 sites sensed.     Skin integrity: Callus and dry skin present.     Toenail Condition: Right toenails are abnormally thick.     Left foot:     Protective Sensation: 6 sites tested.  6 sites sensed.     Skin integrity: Callus and dry skin present.     Toenail Condition: Left toenails are abnormally thick.  Lymphadenopathy:     Cervical: No cervical adenopathy.  Skin:    General: Skin is warm and dry.     Capillary Refill: Capillary refill takes less than 2 seconds.     Coloration: Skin is not pale.     Findings: No erythema or rash.     Comments: No suspicious moles or lesions noted.   Neurological:     Mental Status: She is alert and oriented to person, place, and time.     Cranial Nerves: No cranial nerve deficit.     Motor: No abnormal muscle tone.     Coordination: Coordination normal.     Deep Tendon Reflexes: Reflexes are normal and symmetric.  Psychiatric:        Mood and Affect: Mood normal.        Behavior: Behavior normal.        Thought Content: Thought content normal.        Judgment: Judgment normal.  Assessment/Plan: 1. Encounter for general adult medical examination with abnormal findings Age-appropriate preventive screenings and vaccinations discussed, annual physical exam completed. Routine labs deferred, patient had labs drawn in May and June. PHM updated.   2. Uncontrolled type 2 diabetes mellitus with hyperglycemia (HCC) Stable, no significant change in A1c, urine  microalbumin collected for annual screening.  Follow-up in 3 months for repeat A1c.  Added patient's name to the list for November diabetic eye exams - POCT glycosylated hemoglobin (Hb A1C) - Urine Microalbumin w/creat. ratio  3. Dysuria Routine urinalysis done - UA/M w/rflx Culture, Routine  4. Encounter for screening mammogram for malignant neoplasm of breast Routine mammogram ordered, patient to call [place] [facility] to schedule - MM 3D SCREEN BREAST BILATERAL; Future  5. Screening for colorectal cancer Cologuard test ordered - Cologuard  6. GAD (generalized anxiety disorder) 5 mg dose of escitalopram added.  Patient will call clinic if there is any issue or if she feels like the dose needs to be increased - escitalopram (LEXAPRO) 5 MG tablet; Take 1 tablet (5 mg total) by mouth daily.  Dispense: 90 tablet; Refill: 1      General Counseling: Suann verbalizes understanding of the findings of todays visit and agrees with plan of treatment. I have discussed any further diagnostic evaluation that may be needed or ordered today. We also reviewed her medications today. she has been encouraged to call the office with any questions or concerns that should arise related to todays visit.    Orders Placed This Encounter  Procedures   MM 3D SCREEN BREAST BILATERAL   Urine Microalbumin w/creat. ratio   UA/M w/rflx Culture, Routine   Cologuard   POCT glycosylated hemoglobin (Hb A1C)    Meds ordered this encounter  Medications   escitalopram (LEXAPRO) 5 MG tablet    Sig: Take 1 tablet (5 mg total) by mouth daily.    Dispense:  90 tablet    Refill:  1    Return in about 3 months (around 05/28/2022) for F/U, Recheck A1C, Daysi Boggan PCP.   Total time spent:30 Minutes Time spent includes review of chart, medications, test results, and follow up plan with the patient.   [place] Controlled Substance Database was reviewed by me.  This patient was seen by Sallyanne Kuster, FNP-C  in collaboration with Dr. Beverely Risen as a part of collaborative care agreement.  Madell Heino R. Tedd Sias, MSN, FNP-C Internal medicine

## 2022-02-26 LAB — UA/M W/RFLX CULTURE, ROUTINE
Bilirubin, UA: NEGATIVE
Ketones, UA: NEGATIVE
Leukocytes,UA: NEGATIVE
Nitrite, UA: NEGATIVE
Protein,UA: NEGATIVE
RBC, UA: NEGATIVE
Specific Gravity, UA: 1.012 (ref 1.005–1.030)
Urobilinogen, Ur: 0.2 mg/dL (ref 0.2–1.0)
pH, UA: 7 (ref 5.0–7.5)

## 2022-02-26 LAB — MICROSCOPIC EXAMINATION
Bacteria, UA: NONE SEEN
Casts: NONE SEEN /lpf

## 2022-02-26 LAB — MICROALBUMIN / CREATININE URINE RATIO
Creatinine, Urine: 24.4 mg/dL
Microalb/Creat Ratio: <12 mg/g creat (ref 0–29)
Microalbumin, Urine: <3 ug/mL

## 2022-05-27 ENCOUNTER — Ambulatory Visit: Payer: Self-pay | Admitting: Nurse Practitioner

## 2022-06-13 ENCOUNTER — Other Ambulatory Visit: Payer: Self-pay | Admitting: Nurse Practitioner

## 2022-06-13 DIAGNOSIS — E1165 Type 2 diabetes mellitus with hyperglycemia: Secondary | ICD-10-CM

## 2022-06-24 ENCOUNTER — Ambulatory Visit
Admission: RE | Admit: 2022-06-24 | Discharge: 2022-06-24 | Disposition: A | Discharge status: Home / Self Care | Payer: 59 | Source: Ambulatory Visit | Attending: Nurse Practitioner | Admitting: Nurse Practitioner

## 2022-06-24 DIAGNOSIS — Z1231 Encounter for screening mammogram for malignant neoplasm of breast: Secondary | ICD-10-CM | POA: Diagnosis not present

## 2022-07-01 ENCOUNTER — Other Ambulatory Visit: Payer: Self-pay | Admitting: Internal Medicine

## 2022-07-01 DIAGNOSIS — I10 Essential (primary) hypertension: Secondary | ICD-10-CM

## 2022-07-14 ENCOUNTER — Other Ambulatory Visit: Payer: Self-pay | Admitting: Internal Medicine

## 2022-07-14 ENCOUNTER — Ambulatory Visit (INDEPENDENT_AMBULATORY_CARE_PROVIDER_SITE_OTHER): Payer: 59 | Admitting: Nurse Practitioner

## 2022-07-14 ENCOUNTER — Encounter: Payer: Self-pay | Admitting: Nurse Practitioner

## 2022-07-14 VITALS — BP 145/72 | HR 82 | Temp 98.4°F | Resp 16 | Ht 61.5 in | Wt 199.0 lb

## 2022-07-14 DIAGNOSIS — I2510 Atherosclerotic heart disease of native coronary artery without angina pectoris: Secondary | ICD-10-CM

## 2022-07-14 DIAGNOSIS — R635 Abnormal weight gain: Secondary | ICD-10-CM | POA: Diagnosis not present

## 2022-07-14 DIAGNOSIS — F411 Generalized anxiety disorder: Secondary | ICD-10-CM

## 2022-07-14 DIAGNOSIS — M62830 Muscle spasm of back: Secondary | ICD-10-CM | POA: Diagnosis not present

## 2022-07-14 DIAGNOSIS — E1165 Type 2 diabetes mellitus with hyperglycemia: Secondary | ICD-10-CM

## 2022-07-14 MED ORDER — ESCITALOPRAM OXALATE 5 MG PO TABS: 5.0000 mg | ORAL_TABLET | Freq: Every day | ORAL | 1 refills | Status: DC

## 2022-07-14 MED ORDER — EMPAGLIFLOZIN 25 MG PO TABS: 25.0000 mg | ORAL_TABLET | Freq: Every day | ORAL | 5 refills | Status: DC

## 2022-07-14 MED ORDER — CLOPIDOGREL BISULFATE 75 MG PO TABS: 75.0000 mg | ORAL_TABLET | Freq: Every day | ORAL | 3 refills | Status: DC

## 2022-07-14 MED ORDER — CYCLOBENZAPRINE HCL 5 MG PO TABS: ORAL_TABLET | ORAL | 1 refills | Status: DC

## 2022-07-14 MED ORDER — TRULICITY 0.75 MG/0.5ML ~~LOC~~ SOAJ: 0.7500 mg | SUBCUTANEOUS | 3 refills | Status: DC

## 2022-07-14 NOTE — Progress Notes (Signed)
[facility] [place], [place] [address]  Internal MEDICINE  Office Visit Note  Patient Name: Sherry Torres  [id number]  [id number]  Date of Service: 07/14/2022  Chief Complaint  Patient presents with   Follow-up    HPI Sherry Torres presents for a follow-up visit for diabetes Diabetes --  farxiga not covered by insurance, stopped farxiga, needs alternative. Insurance will cover jardiance now.  Last A1c was 6.4. and was 6.9 today. gained 9 lbs since last office visit. Wants to lose weight as well, BMI is 36.99.  Due for some refills -- plavix for CAD, flexeril for back pain/spasms.  Weight loss -- wants help with weight loss.    Current Medication: Outpatient Encounter Medications as of 07/14/2022  Medication Sig   albuterol (VENTOLIN HFA) 108 (90 Base) MCG/ACT inhaler Inhale 1-2 puffs into the lungs daily.   atorvastatin (LIPITOR) 80 MG tablet TAKE 1 TABLET BY MOUTH DAILY AT 6 PM.   bisoprolol (ZEBETA) 5 MG tablet Take 1 tablet (5 mg total) by mouth daily.   budesonide-formoterol (SYMBICORT) 160-4.5 MCG/ACT inhaler Inhale 2 puffs into the lungs 2 (two) times daily.   buPROPion (WELLBUTRIN XL) 150 MG 24 hr tablet Take 1 tablet (150 mg total) by mouth daily.   Dulaglutide (TRULICITY) 9.44 HQ/7.5FF SOPN Inject 0.75 mg into the skin once a week.   empagliflozin (JARDIANCE) 25 MG TABS tablet Take 1 tablet (25 mg total) by mouth daily before breakfast.   hydrochlorothiazide (HYDRODIURIL) 25 MG tablet Take 1 tablet (25 mg total) by mouth daily.   isosorbide mononitrate (IMDUR) 60 MG 24 hr tablet TAKE 1 & 1/2 TABLETS (90 MG TOTAL) BY MOUTH IN THE MORNING AND 1 & 1/2 TAB AT BEDTIME.   metFORMIN (GLUCOPHAGE) 500 MG tablet TAKE 1 TABLET BY MOUTH EVERY DAY WITH BREAKFAST   nitroGLYCERIN (NITROSTAT) 0.4 MG SL tablet Place 1 tablet (0.4 mg total) under the tongue every 5 (five) minutes as needed for chest pain.   olmesartan (BENICAR) 5 MG tablet TAKE 1 TABLET (5 MG TOTAL)  BY MOUTH DAILY.   Potassium Chloride ER 20 MEQ TBCR TAKE 1 TABLET BY MOUTH EVERY DAY   [DISCONTINUED] benzonatate (TESSALON) 100 MG capsule TAKE 1 CAPSULE BY MOUTH 2 TIMES DAILY AS NEEDED FOR COUGH.   [DISCONTINUED] clopidogrel (PLAVIX) 75 MG tablet Take 1 tablet (75 mg total) by mouth daily with breakfast.   [DISCONTINUED] cyclobenzaprine (FLEXERIL) 5 MG tablet TAKE 1 TABLET BY MOUTH THREE TIMES A DAY AS NEEDED FOR MUSCLE SPASMS   [DISCONTINUED] dapagliflozin propanediol (FARXIGA) 10 MG TABS tablet Take 1 tablet (10 mg total) by mouth daily before breakfast.   [DISCONTINUED] escitalopram (LEXAPRO) 5 MG tablet Take 1 tablet (5 mg total) by mouth daily.   [DISCONTINUED] furosemide (LASIX) 20 MG tablet TAKE 1 TABLET BY MOUTH EVERY DAY AS NEEDED   clopidogrel (PLAVIX) 75 MG tablet Take 1 tablet (75 mg total) by mouth daily with breakfast.   cyclobenzaprine (FLEXERIL) 5 MG tablet TAKE 1 TABLET BY MOUTH THREE TIMES A DAY AS NEEDED FOR MUSCLE SPASMS   escitalopram (LEXAPRO) 5 MG tablet Take 1 tablet (5 mg total) by mouth daily.   No facility-administered encounter medications on file as of 07/14/2022.    Surgical History: Past Surgical History:  Procedure Laterality Date   CARDIAC CATHETERIZATION     LEFT HEART CATH AND CORONARY ANGIOGRAPHY N/A 01/06/2018   Procedure: LEFT HEART CATH AND CORONARY ANGIOGRAPHY;  Surgeon: Wellington Hampshire, MD;  Location: [facility]  INVASIVE CV LAB;  Service: Cardiovascular;  Laterality: N/A;    Medical History: Past Medical History:  Diagnosis Date   Anxiety    Asthma    CAD (coronary artery disease)    a. 12/2017 NSTEMI;  b. 12/2017 MV: basal and mid inflat ischemia; c. 12/2017 Cath: LM nl, LAD 30ost, D1 mild dzs, LCX 40ost, 95d, RCA 81m (nondominant). LCX felt to be culprit but only 1.67mm vessel-->Med rx.   Chronic heart failure with preserved ejection fraction (HFpEF) ([place])    a. 12/2017 Echo: EF 55-60%, no rwma, Gr1 DD; b. 01/2018 Echo: EF 60-65%, gr2 DD, no rwma, mild  MR, nl RV fxn.   Hyperlipidemia LDL goal <70    Hypertension    Recurrent major depressive disorder, in partial remission ([place]) 09/17/2017   Tobacco abuse    Type 2 diabetes mellitus ([place])     Family History: Family History  Problem Relation Age of Onset   Cancer Mother    Breast cancer Mother 62   Hyperlipidemia Father     Social History   Socioeconomic History   Marital status: Married    Spouse name: Not on file   Number of children: Not on file   Years of education: Not on file   Highest education level: Not on file  Occupational History   Not on file  Tobacco Use   Smoking status: Former    Types: Cigarettes   Smokeless tobacco: Never  Vaping Use   Vaping Use: Former  Substance and Sexual Activity   Alcohol use: No   Drug use: No   Sexual activity: Not on file  Other Topics Concern   Not on file  Social History Narrative   Not on file   Social Determinants of Health   Financial Resource Strain: Not on file  Food Insecurity: Not on file  Transportation Needs: Not on file  Physical Activity: Not on file  Stress: Not on file  Social Connections: Not on file  Intimate Partner Violence: Not on file      Review of Systems  Constitutional:  Negative for chills, fatigue and unexpected weight change.  HENT:  Negative for congestion, rhinorrhea, sneezing and sore throat.   Eyes:  Negative for redness.  Respiratory:  Negative for cough, chest tightness and shortness of breath.   Cardiovascular:  Negative for chest pain and palpitations.  Gastrointestinal:  Negative for abdominal pain, constipation, diarrhea, nausea and vomiting.  Genitourinary:  Negative for dysuria and frequency.  Musculoskeletal:  Negative for arthralgias, back pain, joint swelling and neck pain.  Skin:  Negative for rash.  Neurological: Negative.  Negative for tremors and numbness.  Hematological:  Negative for adenopathy. Does not bruise/bleed easily.  Psychiatric/Behavioral:  Negative  for behavioral problems (Depression), sleep disturbance and suicidal ideas. The patient is not nervous/anxious.     Vital Signs: BP (!) 145/72   Pulse 82   Temp 98.4 F (36.9 C)   Resp 16   Ht 5' 1.5" (1.562 m)   Wt 199 lb (90.3 kg)   SpO2 96%   BMI 36.99 kg/m    Physical Exam Vitals reviewed.  Constitutional:      Appearance: Normal appearance. She is obese.  HENT:     Head: Normocephalic and atraumatic.  Eyes:     Pupils: Pupils are equal, round, and reactive to light.  Cardiovascular:     Rate and Rhythm: Normal rate and regular rhythm.  Pulmonary:     Effort: Pulmonary effort is normal.  Neurological:     Mental Status: She is alert and oriented to person, place, and time.  Psychiatric:        Mood and Affect: Mood normal.        Behavior: Behavior normal.        Assessment/Plan: 1. Uncontrolled type 2 diabetes mellitus with hyperglycemia (HCC) Elevated A1c at 6.9 today Switched to jardiance from [place] due to insurance preference Added trulicity to help with Y6V control and weight loss.  - POCT glycosylated hemoglobin (Hb A1C) - empagliflozin (JARDIANCE) 25 MG TABS tablet; Take 1 tablet (25 mg total) by mouth daily before breakfast.  Dispense: 30 tablet; Refill: 5 - Dulaglutide (TRULICITY) 7.85 YI/5.0YD SOPN; Inject 0.75 mg into the skin once a week.  Dispense: 2 mL; Refill: 3  2. Coronary artery disease involving native coronary artery of native heart without angina pectoris Continue plavix as prescribed  - clopidogrel (PLAVIX) 75 MG tablet; Take 1 tablet (75 mg total) by mouth daily with breakfast.  Dispense: 90 tablet; Refill: 3  3. Abnormal weight gain Truclity added for diabetes but may also help with weight loss.   4. Muscle spasm of back Continue cyclobenzaprine as prescribed.  - cyclobenzaprine (FLEXERIL) 5 MG tablet; TAKE 1 TABLET BY MOUTH THREE TIMES A DAY AS NEEDED FOR MUSCLE SPASMS  Dispense: 90 tablet; Refill: 1  5. GAD (generalized  anxiety disorder) Continue escitalopram as prescribed.  - escitalopram (LEXAPRO) 5 MG tablet; Take 1 tablet (5 mg total) by mouth daily.  Dispense: 90 tablet; Refill: 1   General Counseling: Sherry Torres verbalizes understanding of the findings of todays visit and agrees with plan of treatment. I have discussed any further diagnostic evaluation that may be needed or ordered today. We also reviewed her medications today. she has been encouraged to call the office with any questions or concerns that should arise related to todays visit.    Orders Placed This Encounter  Procedures   POCT glycosylated hemoglobin (Hb A1C)    Meds ordered this encounter  Medications   clopidogrel (PLAVIX) 75 MG tablet    Sig: Take 1 tablet (75 mg total) by mouth daily with breakfast.    Dispense:  90 tablet    Refill:  3    For future refills   cyclobenzaprine (FLEXERIL) 5 MG tablet    Sig: TAKE 1 TABLET BY MOUTH THREE TIMES A DAY AS NEEDED FOR MUSCLE SPASMS    Dispense:  90 tablet    Refill:  1    For future refills   empagliflozin (JARDIANCE) 25 MG TABS tablet    Sig: Take 1 tablet (25 mg total) by mouth daily before breakfast.    Dispense:  30 tablet    Refill:  5    Discontinue farxiga and fill new script today   escitalopram (LEXAPRO) 5 MG tablet    Sig: Take 1 tablet (5 mg total) by mouth daily.    Dispense:  90 tablet    Refill:  1   Dulaglutide (TRULICITY) 7.41 OI/7.8MV SOPN    Sig: Inject 0.75 mg into the skin once a week.    Dispense:  2 mL    Refill:  3    Dx code E11.65    Return in about 5 weeks (around 08/18/2022) for F/U, eval new med, [place] PCP.   Total time spent:30 Minutes Time spent includes review of chart, medications, test results, and follow up plan with the patient.   [place] Controlled Substance Database was reviewed by me.  This patient was seen by Jonetta Osgood, FNP-C in collaboration with Dr. Clayborn Bigness as a part of collaborative care agreement.   Sherry Yusuf R.  Valetta Fuller, MSN, FNP-C Internal medicine

## 2022-07-27 ENCOUNTER — Encounter: Payer: Self-pay | Admitting: Nurse Practitioner

## 2022-07-27 LAB — POCT GLYCOSYLATED HEMOGLOBIN (HGB A1C): Hemoglobin A1C: 6.9 % — AB (ref 4.0–5.6)

## 2022-08-06 ENCOUNTER — Other Ambulatory Visit: Payer: Self-pay | Admitting: Nurse Practitioner

## 2022-08-06 DIAGNOSIS — F411 Generalized anxiety disorder: Secondary | ICD-10-CM

## 2022-08-19 ENCOUNTER — Encounter: Payer: Self-pay | Admitting: Nurse Practitioner

## 2022-08-19 ENCOUNTER — Ambulatory Visit (INDEPENDENT_AMBULATORY_CARE_PROVIDER_SITE_OTHER): Payer: 59 | Admitting: Nurse Practitioner

## 2022-08-19 VITALS — BP 120/62 | HR 70 | Temp 98.2°F | Resp 16 | Ht 61.5 in | Wt 191.0 lb

## 2022-08-19 DIAGNOSIS — E1165 Type 2 diabetes mellitus with hyperglycemia: Secondary | ICD-10-CM

## 2022-08-19 DIAGNOSIS — R635 Abnormal weight gain: Secondary | ICD-10-CM | POA: Diagnosis not present

## 2022-08-19 DIAGNOSIS — I10 Essential (primary) hypertension: Secondary | ICD-10-CM

## 2022-08-19 MED ORDER — TRULICITY 1.5 MG/0.5ML ~~LOC~~ SOAJ: 1.5000 mg | SUBCUTANEOUS | 4 refills | Status: DC

## 2022-08-19 NOTE — Progress Notes (Signed)
[facility] [place], [place] [address]  Internal MEDICINE  Office Visit Note  Patient Name: Sherry Torres  [id number]  [id number]  Date of Service: 08/19/2022  Chief Complaint  Patient presents with   Follow-up    HPI Sherry Torres presents for a follow-up visit for diabetes and weight loss.  --wants to stop metformin -  No issues with starting trulicity Has lost 8 lbs since last office visit.  Wants to increase dose of trulicity Eats chicken and fish  BP is controlled with current medications     Current Medication: Outpatient Encounter Medications as of 08/19/2022  Medication Sig   albuterol (VENTOLIN HFA) 108 (90 Base) MCG/ACT inhaler Inhale 1-2 puffs into the lungs daily.   atorvastatin (LIPITOR) 80 MG tablet TAKE 1 TABLET BY MOUTH DAILY AT 6 PM.   bisoprolol (ZEBETA) 5 MG tablet Take 1 tablet (5 mg total) by mouth daily.   budesonide-formoterol (SYMBICORT) 160-4.5 MCG/ACT inhaler Inhale 2 puffs into the lungs 2 (two) times daily.   buPROPion (WELLBUTRIN XL) 150 MG 24 hr tablet TAKE 1 TABLET BY MOUTH EVERY DAY   clopidogrel (PLAVIX) 75 MG tablet Take 1 tablet (75 mg total) by mouth daily with breakfast.   cyclobenzaprine (FLEXERIL) 5 MG tablet TAKE 1 TABLET BY MOUTH THREE TIMES A DAY AS NEEDED FOR MUSCLE SPASMS   Dulaglutide (TRULICITY) 1.5 0000000 SOPN Inject 1.5 mg into the skin once a week.   empagliflozin (JARDIANCE) 25 MG TABS tablet Take 1 tablet (25 mg total) by mouth daily before breakfast.   escitalopram (LEXAPRO) 5 MG tablet Take 1 tablet (5 mg total) by mouth daily.   furosemide (LASIX) 20 MG tablet TAKE 1 TABLET BY MOUTH EVERY DAY AS NEEDED. PLEASE CALL [phone] TO SCHEDULE A YEARLY APPOINTMENT TO RECEIVE FURTHER REFILLS. THANK YOU   hydrochlorothiazide (HYDRODIURIL) 25 MG tablet Take 1 tablet (25 mg total) by mouth daily.   isosorbide mononitrate (IMDUR) 60 MG 24 hr tablet TAKE 1 & 1/2 TABLETS (90 MG TOTAL) BY MOUTH IN THE MORNING  AND 1 & 1/2 TAB AT BEDTIME.   nitroGLYCERIN (NITROSTAT) 0.4 MG SL tablet Place 1 tablet (0.4 mg total) under the tongue every 5 (five) minutes as needed for chest pain.   olmesartan (BENICAR) 5 MG tablet TAKE 1 TABLET (5 MG TOTAL) BY MOUTH DAILY.   Potassium Chloride ER 20 MEQ TBCR TAKE 1 TABLET BY MOUTH EVERY DAY   [DISCONTINUED] Dulaglutide (TRULICITY) A999333 0000000 SOPN Inject 0.75 mg into the skin once a week.   [DISCONTINUED] metFORMIN (GLUCOPHAGE) 500 MG tablet TAKE 1 TABLET BY MOUTH EVERY DAY WITH BREAKFAST   No facility-administered encounter medications on file as of 08/19/2022.    Surgical History: Past Surgical History:  Procedure Laterality Date   CARDIAC CATHETERIZATION     LEFT HEART CATH AND CORONARY ANGIOGRAPHY N/A 01/06/2018   Procedure: LEFT HEART CATH AND CORONARY ANGIOGRAPHY;  Surgeon: Wellington Hampshire, MD;  Location: [place] [facility];  Service: Cardiovascular;  Laterality: N/A;    Medical History: Past Medical History:  Diagnosis Date   Anxiety    Asthma    CAD (coronary artery disease)    a. 12/2017 NSTEMI;  b. 12/2017 MV: basal and mid inflat ischemia; c. 12/2017 Cath: LM nl, LAD 30ost, D1 mild dzs, LCX 40ost, 95d, RCA 48m (nondominant). LCX felt to be culprit but only 1.67mm vessel-->Med rx.   Chronic heart failure with preserved ejection fraction (HFpEF)    a. 12/2017 Echo: EF  55-60%, no rwma, Gr1 DD; b. 01/2018 Echo: EF 60-65%, gr2 DD, no rwma, mild MR, nl RV fxn.   Hyperlipidemia LDL goal <70    Hypertension    Recurrent major depressive disorder, in partial remission 09/17/2017   Tobacco abuse    Type 2 diabetes mellitus     Family History: Family History  Problem Relation Age of Onset   Cancer Mother    Breast cancer Mother 36   Hyperlipidemia Father     Social History   Socioeconomic History   Marital status: Married    Spouse name: Not on file   Number of children: Not on file   Years of education: Not on file   Highest education level: Not  on file  Occupational History   Not on file  Tobacco Use   Smoking status: Former    Types: Cigarettes   Smokeless tobacco: Never  Vaping Use   Vaping Use: Former  Substance and Sexual Activity   Alcohol use: No   Drug use: No   Sexual activity: Not on file  Other Topics Concern   Not on file  Social History Narrative   Not on file   Social Determinants of Health   Financial Resource Strain: Not on file  Food Insecurity: Not on file  Transportation Needs: Not on file  Physical Activity: Not on file  Stress: Not on file  Social Connections: Not on file  Intimate Partner Violence: Not on file      Review of Systems  Constitutional:  Negative for chills, fatigue and unexpected weight change.  HENT:  Negative for congestion, rhinorrhea, sneezing and sore throat.   Eyes:  Negative for redness.  Respiratory:  Negative for cough, chest tightness and shortness of breath.   Cardiovascular:  Negative for chest pain and palpitations.  Gastrointestinal:  Negative for abdominal pain, constipation, diarrhea, nausea and vomiting.  Genitourinary:  Negative for dysuria and frequency.  Musculoskeletal:  Negative for arthralgias, back pain, joint swelling and neck pain.  Skin:  Negative for rash.  Neurological: Negative.  Negative for tremors and numbness.  Hematological:  Negative for adenopathy. Does not bruise/bleed easily.  Psychiatric/Behavioral:  Negative for behavioral problems (Depression), sleep disturbance and suicidal ideas. The patient is not nervous/anxious.     Vital Signs: BP 120/62   Pulse 70   Temp 98.2 F (36.8 C)   Resp 16   Ht 5' 1.5" (1.562 m)   Wt 191 lb (86.6 kg)   SpO2 95%   BMI 35.50 kg/m    Physical Exam Vitals reviewed.  Constitutional:      Appearance: Normal appearance. She is obese.  HENT:     Head: Normocephalic and atraumatic.  Eyes:     Pupils: Pupils are equal, round, and reactive to light.  Cardiovascular:     Rate and Rhythm: Normal  rate and regular rhythm.  Pulmonary:     Effort: Pulmonary effort is normal.  Neurological:     Mental Status: She is alert and oriented to person, place, and time.  Psychiatric:        Mood and Affect: Mood normal.        Behavior: Behavior normal.        Assessment/Plan: 1. Uncontrolled type 2 diabetes mellitus with hyperglycemia Continue trulicity at increased dose.  - Dulaglutide (TRULICITY) 1.5 0000000 SOPN; Inject 1.5 mg into the skin once a week.  Dispense: 2 mL; Refill: 4  2. Abnormal weight gain Lost 8 lbs since prior visit,  continue trulicity at increased dose.   3. Essential hypertension Stable, continue medications as prescribed.    General Counseling: Siomara verbalizes understanding of the findings of todays visit and agrees with plan of treatment. I have discussed any further diagnostic evaluation that may be needed or ordered today. We also reviewed her medications today. she has been encouraged to call the office with any questions or concerns that should arise related to todays visit.    No orders of the defined types were placed in this encounter.   Meds ordered this encounter  Medications   Dulaglutide (TRULICITY) 1.5 0000000 SOPN    Sig: Inject 1.5 mg into the skin once a week.    Dispense:  2 mL    Refill:  4    Fill new script today.    Return in about 12 weeks (around 11/11/2022) for F/U, Recheck A1C, Quindon Denker PCP.   Total time spent:30 Minutes Time spent includes review of chart, medications, test results, and follow up plan with the patient.    Controlled Substance Database was reviewed by me.  This patient was seen by Jonetta Osgood, FNP-C in collaboration with Dr. Clayborn Bigness as a part of collaborative care agreement.   Karyn Brull R. Valetta Fuller, MSN, FNP-C Internal medicine

## 2022-08-26 ENCOUNTER — Other Ambulatory Visit: Payer: Self-pay | Admitting: Internal Medicine

## 2022-08-26 DIAGNOSIS — I10 Essential (primary) hypertension: Secondary | ICD-10-CM

## 2022-08-26 NOTE — Telephone Encounter (Signed)
Good Morning,   Could you please contact this patient to schedule a one year follow up visit.? The patient was last seen by Dr. Okey Dupre on 10/15/21. Thank you so much.

## 2022-09-03 ENCOUNTER — Other Ambulatory Visit: Payer: Self-pay | Admitting: Internal Medicine

## 2022-09-03 DIAGNOSIS — I10 Essential (primary) hypertension: Secondary | ICD-10-CM

## 2022-10-02 ENCOUNTER — Telehealth: Payer: Self-pay

## 2022-10-02 NOTE — Telephone Encounter (Signed)
Completed P.A. for patient's Jardiance.

## 2022-10-06 ENCOUNTER — Other Ambulatory Visit: Payer: Self-pay | Admitting: Internal Medicine

## 2022-10-13 ENCOUNTER — Telehealth: Payer: Self-pay

## 2022-10-13 DIAGNOSIS — E1165 Type 2 diabetes mellitus with hyperglycemia: Secondary | ICD-10-CM

## 2022-10-16 MED ORDER — EMPAGLIFLOZIN 25 MG PO TABS: 25.0000 mg | ORAL_TABLET | Freq: Every day | ORAL | 5 refills | Status: DC

## 2022-10-23 ENCOUNTER — Ambulatory Visit: Payer: 59 | Attending: Internal Medicine | Admitting: Internal Medicine

## 2022-10-23 ENCOUNTER — Encounter: Payer: Self-pay | Admitting: Internal Medicine

## 2022-10-23 VITALS — BP 116/60 | HR 68 | Ht 61.5 in | Wt 185.8 lb

## 2022-10-23 DIAGNOSIS — I10 Essential (primary) hypertension: Secondary | ICD-10-CM

## 2022-10-23 DIAGNOSIS — E1169 Type 2 diabetes mellitus with other specified complication: Secondary | ICD-10-CM

## 2022-10-23 DIAGNOSIS — Z79899 Other long term (current) drug therapy: Secondary | ICD-10-CM | POA: Diagnosis not present

## 2022-10-23 DIAGNOSIS — I25118 Atherosclerotic heart disease of native coronary artery with other forms of angina pectoris: Secondary | ICD-10-CM | POA: Diagnosis not present

## 2022-10-23 DIAGNOSIS — E785 Hyperlipidemia, unspecified: Secondary | ICD-10-CM | POA: Diagnosis not present

## 2022-10-23 DIAGNOSIS — Z7984 Long term (current) use of oral hypoglycemic drugs: Secondary | ICD-10-CM

## 2022-10-23 NOTE — Progress Notes (Unsigned)
Follow-up Outpatient Visit Date: 10/23/2022  Primary Care Provider: Sallyanne Kuster, NP [address] [place] [address]  Chief Complaint: Follow-up coronary artery disease  HPI:  Sherry Torres is a 62 y.o. female with history of coronary artery disease with high-grade disease involving distal LCx and nondominant RCA managed medically, hypertension, hyperlipidemia, diabetes mellitus, and anxiety, who presents for follow-up of coronary artery disease.  I last saw her a year ago, at which time she was feeling well other than occasional mild dependent edema for which she was using every other day furosemide.  She denied angina and exertional dyspnea.  We did not make any medication changes or pursue additional testing.  Today, [name] reports that she has been feeling well without chest pain or shortness of breath.  She notes a little swelling around the ankles at times, though this is generally well-controlled with her as needed furosemide.  She denies palpitations and lightheadedness.  Blood pressure is typically well-controlled at home as well as when she sees her primary care provider.  --------------------------------------------------------------------------------------------------  Past Medical History:  Diagnosis Date   Anxiety    Asthma    CAD (coronary artery disease)    a. 12/2017 NSTEMI;  b. 12/2017 MV: basal and mid inflat ischemia; c. 12/2017 Cath: LM nl, LAD 30ost, D1 mild dzs, LCX 40ost, 95d, RCA 37m (nondominant). LCX felt to be culprit but only 1.52mm vessel-->Med rx.   Chronic heart failure with preserved ejection fraction (HFpEF) (HCC)    a. 12/2017 Echo: EF 55-60%, no rwma, Gr1 DD; b. 01/2018 Echo: EF 60-65%, gr2 DD, no rwma, mild MR, nl RV fxn.   Hyperlipidemia LDL goal <70    Hypertension    Recurrent major depressive disorder, in partial remission (HCC) 09/17/2017   Tobacco abuse    Type 2 diabetes mellitus (HCC)    Past Surgical History:  Procedure  Laterality Date   CARDIAC CATHETERIZATION     LEFT HEART CATH AND CORONARY ANGIOGRAPHY N/A 01/06/2018   Procedure: LEFT HEART CATH AND CORONARY ANGIOGRAPHY;  Surgeon: Iran Ouch, MD;  Location: [facility];  Service: Cardiovascular;  Laterality: N/A;    Current Meds  Medication Sig   albuterol (VENTOLIN HFA) 108 (90 Base) MCG/ACT inhaler Inhale 1-2 puffs into the lungs daily.   atorvastatin (LIPITOR) 80 MG tablet TAKE 1 TABLET BY MOUTH DAILY AT 6 PM.   bisoprolol (ZEBETA) 5 MG tablet Take 1 tablet (5 mg total) by mouth daily.   buPROPion (WELLBUTRIN XL) 150 MG 24 hr tablet TAKE 1 TABLET BY MOUTH EVERY DAY   clopidogrel (PLAVIX) 75 MG tablet Take 1 tablet (75 mg total) by mouth daily with breakfast.   cyclobenzaprine (FLEXERIL) 5 MG tablet TAKE 1 TABLET BY MOUTH THREE TIMES A DAY AS NEEDED FOR MUSCLE SPASMS   Dulaglutide (TRULICITY) 1.5 MG/0.5ML SOPN Inject 1.5 mg into the skin once a week.   empagliflozin (JARDIANCE) 25 MG TABS tablet Take 1 tablet (25 mg total) by mouth daily before breakfast.   escitalopram (LEXAPRO) 5 MG tablet Take 1 tablet (5 mg total) by mouth daily.   furosemide (LASIX) 20 MG tablet TAKE 1 TABLET BY MOUTH EVERY DAY AS NEEDED.   hydrochlorothiazide (HYDRODIURIL) 25 MG tablet Take 1 tablet (25 mg total) by mouth daily.   isosorbide mononitrate (IMDUR) 60 MG 24 hr tablet TAKE 1 & 1/2 TABLETS (90 MG TOTAL) BY MOUTH IN THE MORNING AND 1 & 1/2 TAB AT BEDTIME.   nitroGLYCERIN (NITROSTAT) 0.4 MG  SL tablet Place 1 tablet (0.4 mg total) under the tongue every 5 (five) minutes as needed for chest pain.   olmesartan (BENICAR) 5 MG tablet TAKE 1 TABLET (5 MG TOTAL) BY MOUTH DAILY.   Potassium Chloride ER 20 MEQ TBCR TAKE 1 TABLET BY MOUTH EVERY DAY    Allergies: Codeine  Social History   Tobacco Use   Smoking status: Former    Types: Cigarettes   Smokeless tobacco: Never  Vaping Use   Vaping Use: Former  Substance Use Topics   Alcohol use: No   Drug use:  No    Family History  Problem Relation Age of Onset   Cancer Mother    Breast cancer Mother 57   Hyperlipidemia Father     Review of Systems: A 12-system review of systems was performed and was negative except as noted in the HPI.  --------------------------------------------------------------------------------------------------  Physical Exam: BP 116/60 (BP Location: Left Arm, Patient Position: Sitting, Cuff Size: Normal)   Pulse 68   Ht 5' 1.5" (1.562 m)   Wt 185 lb 12.8 oz (84.3 kg)   SpO2 98%   BMI 34.54 kg/m   General:  NAD. Neck: No JVD or HJR. Lungs: Clear to auscultation bilaterally without wheezes or crackles. Heart: Regular rate and rhythm without murmurs, rubs, or gallops. Abdomen: Soft, nontender, nondistended. Extremities: No lower extremity edema.  EKG: Normal sinus rhythm without abnormalities.  Lab Results  Component Value Date   WBC 7.5 10/15/2021   HGB 12.7 10/15/2021   HCT 39.4 10/15/2021   MCV 85.1 10/15/2021   PLT 242 10/15/2021    Lab Results  Component Value Date   NA 140 11/05/2021   K 3.7 11/05/2021   CL 104 11/05/2021   CO2 28 11/05/2021   BUN 13 11/05/2021   CREATININE 0.86 11/05/2021   GLUCOSE 184 (H) 11/05/2021   ALT 19 10/15/2021    Lab Results  Component Value Date   CHOL 103 10/15/2021   HDL 42 10/15/2021   LDLCALC 38 10/15/2021   TRIG 117 10/15/2021   CHOLHDL 2.5 10/15/2021    --------------------------------------------------------------------------------------------------  ASSESSMENT AND PLAN: Coronary artery disease with stable angina: No chest pain or dyspnea reported.  Continue secondary prevention and antianginal therapy with current medications (bisoprolol and isosorbide mononitrate).  Check CBC and CMP at [name]'s convenience.  Hypertension: Blood pressure well-controlled.  Continue bisoprolol, hydrochlorothiazide, isosorbide mononitrate, and olmesartan.  Hyperlipidemia associated with type 2  diabetes mellitus: Lipids well-controlled on last check a year ago.  Continue atorvastatin 80 mg daily with plans to recheck CMP and lipid panel at Ms. Schad earliest convenience.  Follow-up: Return to clinic in 1 year.  Yvonne Kendall, MD 10/25/2022 2:08 PM

## 2022-10-23 NOTE — Patient Instructions (Signed)
Medication Instructions:  No changes *If you need a refill on your cardiac medications before your next appointment, please call your pharmacy*   Lab Work: Your provider would like for you to return in 1-2 weeks to have the following labs drawn: Lipid panel, CBC, CMP.   Please go to the [facility] entrance and check in at the front desk.  You do not need an appointment.  They are open from 7am-6 pm.  You don't need to be fasting.  If you have labs (blood work) drawn today and your tests are completely normal, you will receive your results only by: MyChart Message (if you have MyChart) OR A paper copy in the mail If you have any lab test that is abnormal or we need to change your treatment, we will call you to review the results.   Testing/Procedures: none   Follow-Up: At [facility], you and your health needs are our priority.  As part of our continuing mission to provide you with exceptional heart care, we have created designated Provider Care Teams.  These Care Teams include your primary Cardiologist (physician) and Advanced Practice Providers (APPs -  Physician Assistants and Nurse Practitioners) who all work together to provide you with the care you need, when you need it.  We recommend signing up for the patient portal called "MyChart".  Sign up information is provided on this After Visit Summary.  MyChart is used to connect with patients for Virtual Visits (Telemedicine).  Patients are able to view lab/test results, encounter notes, upcoming appointments, etc.  Non-urgent messages can be sent to your provider as well.   To learn more about what you can do with MyChart, go to [email or web address].    Your next appointment:   1 year(s)  Provider:   You may see Yvonne Kendall, MD or one of the following Advanced Practice Providers on your designated Care Team:   Nicolasa Ducking, NP Eula Listen, PA-C Cadence Fransico Michael, PA-C Charlsie Quest, NP

## 2022-10-28 NOTE — Telephone Encounter (Signed)
done

## 2022-11-04 ENCOUNTER — Other Ambulatory Visit
Admission: RE | Admit: 2022-11-04 | Discharge: 2022-11-04 | Disposition: A | Discharge status: Home / Self Care | Payer: 59 | Attending: Internal Medicine | Admitting: Internal Medicine

## 2022-11-04 DIAGNOSIS — Z79899 Other long term (current) drug therapy: Secondary | ICD-10-CM | POA: Insufficient documentation

## 2022-11-04 LAB — CBC
HCT: 39 % (ref 36.0–46.0)
Hemoglobin: 12.8 g/dL (ref 12.0–15.0)
MCH: 28.1 pg (ref 26.0–34.0)
MCHC: 32.8 g/dL (ref 30.0–36.0)
MCV: 85.7 fL (ref 80.0–100.0)
Platelets: 239 10*3/uL (ref 150–400)
RBC: 4.55 MIL/uL (ref 3.87–5.11)
RDW: 15 % (ref 11.5–15.5)
WBC: 7.7 10*3/uL (ref 4.0–10.5)
nRBC: 0 % (ref 0.0–0.2)

## 2022-11-04 LAB — LIPID PANEL
Cholesterol: 103 mg/dL (ref 0–200)
HDL: 42 mg/dL (ref >40)
LDL Cholesterol: 38 mg/dL (ref 0–99)
Total CHOL/HDL Ratio: 2.5 RATIO
Triglycerides: 113 mg/dL (ref <150)
VLDL: 23 mg/dL (ref 0–40)

## 2022-11-04 LAB — COMPREHENSIVE METABOLIC PANEL
ALT: 17 U/L (ref 0–44)
AST: 21 U/L (ref 15–41)
Albumin: 4.3 g/dL (ref 3.5–5.0)
Alkaline Phosphatase: 53 U/L (ref 38–126)
Anion gap: 11 (ref 5–15)
BUN: 17 mg/dL (ref 8–23)
CO2: 26 mmol/L (ref 22–32)
Calcium: 9.2 mg/dL (ref 8.9–10.3)
Chloride: 102 mmol/L (ref 98–111)
Creatinine, Ser: 1.01 mg/dL — ABNORMAL HIGH (ref 0.44–1.00)
GFR, Estimated: >60 mL/min (ref >60)
Glucose, Bld: 101 mg/dL — ABNORMAL HIGH (ref 70–99)
Potassium: 3.5 mmol/L (ref 3.5–5.1)
Sodium: 139 mmol/L (ref 135–145)
Total Bilirubin: 0.9 mg/dL (ref 0.3–1.2)
Total Protein: 7.2 g/dL (ref 6.5–8.1)

## 2022-11-11 ENCOUNTER — Ambulatory Visit (INDEPENDENT_AMBULATORY_CARE_PROVIDER_SITE_OTHER): Payer: 59 | Admitting: Nurse Practitioner

## 2022-11-11 ENCOUNTER — Encounter: Payer: Self-pay | Admitting: Nurse Practitioner

## 2022-11-11 VITALS — BP 130/68 | HR 80 | Temp 98.2°F | Resp 16 | Ht 61.5 in | Wt 184.0 lb

## 2022-11-11 DIAGNOSIS — I10 Essential (primary) hypertension: Secondary | ICD-10-CM | POA: Diagnosis not present

## 2022-11-11 DIAGNOSIS — E1165 Type 2 diabetes mellitus with hyperglycemia: Secondary | ICD-10-CM

## 2022-11-11 DIAGNOSIS — I2510 Atherosclerotic heart disease of native coronary artery without angina pectoris: Secondary | ICD-10-CM

## 2022-11-11 DIAGNOSIS — F411 Generalized anxiety disorder: Secondary | ICD-10-CM

## 2022-11-11 DIAGNOSIS — L538 Other specified erythematous conditions: Secondary | ICD-10-CM

## 2022-11-11 LAB — POCT GLYCOSYLATED HEMOGLOBIN (HGB A1C): Hemoglobin A1C: 5.5 % (ref 4.0–5.6)

## 2022-11-11 MED ORDER — ATORVASTATIN CALCIUM 80 MG PO TABS: ORAL_TABLET | ORAL | 3 refills | Status: DC

## 2022-11-11 MED ORDER — BISOPROLOL FUMARATE 5 MG PO TABS: 5.0000 mg | ORAL_TABLET | Freq: Every day | ORAL | 3 refills | Status: DC

## 2022-11-11 MED ORDER — CLOTRIMAZOLE-BETAMETHASONE 1-0.05 % EX CREA
1.0000 | TOPICAL_CREAM | Freq: Two times a day (BID) | CUTANEOUS | 2 refills | Status: AC
Start: 2022-11-11 — End: ?

## 2022-11-11 MED ORDER — POTASSIUM CHLORIDE ER 20 MEQ PO TBCR
1.0000 | EXTENDED_RELEASE_TABLET | Freq: Every day | ORAL | 1 refills | Status: DC
Start: 2022-11-11 — End: 2023-03-03

## 2022-11-11 MED ORDER — TRULICITY 1.5 MG/0.5ML ~~LOC~~ SOAJ
1.5000 mg | SUBCUTANEOUS | 4 refills | Status: DC
Start: 2022-11-11 — End: 2022-12-31

## 2022-11-11 MED ORDER — ESCITALOPRAM OXALATE 5 MG PO TABS
5.0000 mg | ORAL_TABLET | Freq: Every day | ORAL | 1 refills | Status: DC
Start: 2022-11-11 — End: 2023-03-03

## 2022-11-11 MED ORDER — HYDROCHLOROTHIAZIDE 25 MG PO TABS
25.0000 mg | ORAL_TABLET | Freq: Every day | ORAL | 3 refills | Status: DC
Start: 2022-11-11 — End: 2023-08-16

## 2022-11-11 NOTE — Progress Notes (Signed)
[facility] [address] [place], [place] [address]  Internal MEDICINE  Office Visit Note  Patient Name: Sherry Torres  [id number]  [id number]  Date of Service: 11/11/2022  Chief Complaint  Patient presents with   Depression   Diabetes   Hypertension   Hyperlipidemia   Follow-up    HPI Shalamar presents for a follow-up visit for diabetes, hypertension, rash Diabetes -- A1c improved to normal range Hypertension --controlled with current medication, will follow up with cardiology in 1 year.  Rash on anterior lower leg bilaterally -- has been told this is a possible fungal rash in the past, looks like a red macular itchy rash.  High cholesterol -- on max dose atorvastatin    Current Medication: Outpatient Encounter Medications as of 11/11/2022  Medication Sig   albuterol (VENTOLIN HFA) 108 (90 Base) MCG/ACT inhaler Inhale 1-2 puffs into the lungs daily.   buPROPion (WELLBUTRIN XL) 150 MG 24 hr tablet TAKE 1 TABLET BY MOUTH EVERY DAY   clopidogrel (PLAVIX) 75 MG tablet Take 1 tablet (75 mg total) by mouth daily with breakfast.   clotrimazole-betamethasone (LOTRISONE) cream Apply 1 Application topically 2 (two) times daily. To affected area until resolved.   cyclobenzaprine (FLEXERIL) 5 MG tablet TAKE 1 TABLET BY MOUTH THREE TIMES A DAY AS NEEDED FOR MUSCLE SPASMS   empagliflozin (JARDIANCE) 25 MG TABS tablet Take 1 tablet (25 mg total) by mouth daily before breakfast.   furosemide (LASIX) 20 MG tablet TAKE 1 TABLET BY MOUTH EVERY DAY AS NEEDED.   isosorbide mononitrate (IMDUR) 60 MG 24 hr tablet TAKE 1 & 1/2 TABLETS (90 MG TOTAL) BY MOUTH IN THE MORNING AND 1 & 1/2 TAB AT BEDTIME.   nitroGLYCERIN (NITROSTAT) 0.4 MG SL tablet Place 1 tablet (0.4 mg total) under the tongue every 5 (five) minutes as needed for chest pain.   olmesartan (BENICAR) 5 MG tablet TAKE 1 TABLET (5 MG TOTAL) BY MOUTH DAILY.   [DISCONTINUED] atorvastatin (LIPITOR) 80 MG tablet TAKE 1 TABLET BY  MOUTH DAILY AT 6 PM.   [DISCONTINUED] bisoprolol (ZEBETA) 5 MG tablet Take 1 tablet (5 mg total) by mouth daily.   [DISCONTINUED] budesonide-formoterol (SYMBICORT) 160-4.5 MCG/ACT inhaler Inhale 2 puffs into the lungs 2 (two) times daily.   [DISCONTINUED] Dulaglutide (TRULICITY) 1.5 MG/0.5ML SOPN Inject 1.5 mg into the skin once a week.   [DISCONTINUED] escitalopram (LEXAPRO) 5 MG tablet Take 1 tablet (5 mg total) by mouth daily.   [DISCONTINUED] hydrochlorothiazide (HYDRODIURIL) 25 MG tablet Take 1 tablet (25 mg total) by mouth daily.   [DISCONTINUED] Potassium Chloride ER 20 MEQ TBCR TAKE 1 TABLET BY MOUTH EVERY DAY   atorvastatin (LIPITOR) 80 MG tablet TAKE 1 TABLET BY MOUTH DAILY AT 6 PM.   bisoprolol (ZEBETA) 5 MG tablet Take 1 tablet (5 mg total) by mouth daily.   Dulaglutide (TRULICITY) 1.5 MG/0.5ML SOPN Inject 1.5 mg into the skin once a week.   escitalopram (LEXAPRO) 5 MG tablet Take 1 tablet (5 mg total) by mouth daily.   hydrochlorothiazide (HYDRODIURIL) 25 MG tablet Take 1 tablet (25 mg total) by mouth daily.   Potassium Chloride ER 20 MEQ TBCR Take 1 tablet (20 mEq total) by mouth daily.   No facility-administered encounter medications on file as of 11/11/2022.    Surgical History: Past Surgical History:  Procedure Laterality Date   CARDIAC CATHETERIZATION     LEFT HEART CATH AND CORONARY ANGIOGRAPHY N/A 01/06/2018   Procedure: LEFT HEART CATH AND CORONARY ANGIOGRAPHY;  Surgeon: Iran Ouch, MD;  Location: [facility] INVASIVE CV LAB;  Service: Cardiovascular;  Laterality: N/A;    Medical History: Past Medical History:  Diagnosis Date   Anxiety    Asthma    CAD (coronary artery disease)    a. 12/2017 NSTEMI;  b. 12/2017 MV: basal and mid inflat ischemia; c. 12/2017 Cath: LM nl, LAD 30ost, D1 mild dzs, LCX 40ost, 95d, RCA 21m (nondominant). LCX felt to be culprit but only 1.13mm vessel-->Med rx.   Chronic heart failure with preserved ejection fraction (HFpEF) (HCC)    a. 12/2017  Echo: EF 55-60%, no rwma, Gr1 DD; b. 01/2018 Echo: EF 60-65%, gr2 DD, no rwma, mild MR, nl RV fxn.   Hyperlipidemia LDL goal <70    Hypertension    Recurrent major depressive disorder, in partial remission (HCC) 09/17/2017   Tobacco abuse    Type 2 diabetes mellitus (HCC)     Family History: Family History  Problem Relation Age of Onset   Cancer Mother    Breast cancer Mother 36   Hyperlipidemia Father     Social History   Socioeconomic History   Marital status: Married    Spouse name: Not on file   Number of children: Not on file   Years of education: Not on file   Highest education level: Not on file  Occupational History   Not on file  Tobacco Use   Smoking status: Former    Types: Cigarettes   Smokeless tobacco: Never  Vaping Use   Vaping Use: Former  Substance and Sexual Activity   Alcohol use: No   Drug use: No   Sexual activity: Not on file  Other Topics Concern   Not on file  Social History Narrative   Not on file   Social Determinants of Health   Financial Resource Strain: Not on file  Food Insecurity: Not on file  Transportation Needs: Not on file  Physical Activity: Not on file  Stress: Not on file  Social Connections: Not on file  Intimate Partner Violence: Not on file      Review of Systems  Constitutional: Negative.  Negative for chills, fatigue and unexpected weight change.  HENT: Negative.  Negative for congestion, rhinorrhea, sneezing and sore throat.   Eyes:  Negative for redness.  Respiratory: Negative.  Negative for cough, chest tightness and shortness of breath.   Cardiovascular: Negative.  Negative for chest pain and palpitations.  Gastrointestinal:  Negative for abdominal pain, constipation, diarrhea, nausea and vomiting.  Genitourinary:  Negative for dysuria and frequency.  Musculoskeletal: Negative.  Negative for arthralgias, back pain, joint swelling and neck pain.  Skin:  Positive for rash.  Neurological: Negative.  Negative  for tremors and numbness.  Hematological: Negative.  Negative for adenopathy. Does not bruise/bleed easily.  Psychiatric/Behavioral: Negative.  Negative for behavioral problems (Depression), sleep disturbance and suicidal ideas. The patient is not nervous/anxious.     Vital Signs: BP 130/68   Pulse 80   Temp 98.2 F (36.8 C)   Resp 16   Ht 5' 1.5" (1.562 m)   Wt 184 lb (83.5 kg)   SpO2 96%   BMI 34.20 kg/m    Physical Exam Vitals reviewed.  Constitutional:      General: She is not in acute distress.    Appearance: Normal appearance. She is obese. She is not ill-appearing.  HENT:     Head: Normocephalic and atraumatic.  Eyes:     Pupils: Pupils are equal, round, and  reactive to light.  Cardiovascular:     Rate and Rhythm: Normal rate and regular rhythm.     Pulses: Normal pulses.  Pulmonary:     Effort: Pulmonary effort is normal. No respiratory distress.  Skin:    Findings: Erythema and rash present. Rash is macular.          Comments: Marked areas are where the macular erythematous rash.  Neurological:     Mental Status: She is alert and oriented to person, place, and time.  Psychiatric:        Mood and Affect: Mood normal.        Behavior: Behavior normal.        Assessment/Plan: 1. Uncontrolled type 2 diabetes mellitus with hyperglycemia (HCC) A1c improved to normal range, continue trulicity as prescribed, refills ordered. - POCT glycosylated hemoglobin (Hb A1C) - Dulaglutide (TRULICITY) 1.5 MG/0.5ML SOPN; Inject 1.5 mg into the skin once a week.  Dispense: 2 mL; Refill: 4  2. Essential hypertension Continue bisoprolol, hydrochlorothiazide, and potassium as prescribed. Refills ordered  - bisoprolol (ZEBETA) 5 MG tablet; Take 1 tablet (5 mg total) by mouth daily.  Dispense: 90 tablet; Refill: 3 - hydrochlorothiazide (HYDRODIURIL) 25 MG tablet; Take 1 tablet (25 mg total) by mouth daily.  Dispense: 90 tablet; Refill: 3 - Potassium Chloride ER 20 MEQ TBCR;  Take 1 tablet (20 mEq total) by mouth daily.  Dispense: 90 tablet; Refill: 1  3. Coronary artery disease involving native coronary artery of native heart without angina pectoris Continue atorvastatin as prescribed. Refills ordered  - atorvastatin (LIPITOR) 80 MG tablet; TAKE 1 TABLET BY MOUTH DAILY AT 6 PM.  Dispense: 90 tablet; Refill: 3  4. Macular erythematous rash Use lotrisone 1-2 times daily until rash is resolved.  - clotrimazole-betamethasone (LOTRISONE) cream; Apply 1 Application topically 2 (two) times daily. To affected area until resolved.  Dispense: 45 g; Refill: 2  5. GAD (generalized anxiety disorder) Continue escitalopram as prescribed. Refills ordered  - escitalopram (LEXAPRO) 5 MG tablet; Take 1 tablet (5 mg total) by mouth daily.  Dispense: 90 tablet; Refill: 1   General Counseling: Lova verbalizes understanding of the findings of todays visit and agrees with plan of treatment. I have discussed any further diagnostic evaluation that may be needed or ordered today. We also reviewed her medications today. she has been encouraged to call the office with any questions or concerns that should arise related to todays visit.    Orders Placed This Encounter  Procedures   POCT glycosylated hemoglobin (Hb A1C)    Meds ordered this encounter  Medications   clotrimazole-betamethasone (LOTRISONE) cream    Sig: Apply 1 Application topically 2 (two) times daily. To affected area until resolved.    Dispense:  45 g    Refill:  2   bisoprolol (ZEBETA) 5 MG tablet    Sig: Take 1 tablet (5 mg total) by mouth daily.    Dispense:  90 tablet    Refill:  3    For future refills   atorvastatin (LIPITOR) 80 MG tablet    Sig: TAKE 1 TABLET BY MOUTH DAILY AT 6 PM.    Dispense:  90 tablet    Refill:  3    For future refills   Dulaglutide (TRULICITY) 1.5 MG/0.5ML SOPN    Sig: Inject 1.5 mg into the skin once a week.    Dispense:  2 mL    Refill:  4    Fill new script today.  escitalopram (LEXAPRO) 5 MG tablet    Sig: Take 1 tablet (5 mg total) by mouth daily.    Dispense:  90 tablet    Refill:  1   hydrochlorothiazide (HYDRODIURIL) 25 MG tablet    Sig: Take 1 tablet (25 mg total) by mouth daily.    Dispense:  90 tablet    Refill:  3    For future refills   Potassium Chloride ER 20 MEQ TBCR    Sig: Take 1 tablet (20 mEq total) by mouth daily.    Dispense:  90 tablet    Refill:  1    Return for previously scheduled, CPE, Jonet Mathies PCP in october.   Total time spent:30 Minutes Time spent includes review of chart, medications, test results, and follow up plan with the patient.   [place] Controlled Substance Database was reviewed by me.  This patient was seen by Sallyanne Kuster, FNP-C in collaboration with Dr. Beverely Risen as a part of collaborative care agreement.   Ashvik Grundman R. Tedd Sias, MSN, FNP-C Internal medicine

## 2022-11-19 ENCOUNTER — Other Ambulatory Visit: Payer: Self-pay | Admitting: Internal Medicine

## 2022-12-09 ENCOUNTER — Ambulatory Visit: Payer: 59 | Admitting: Internal Medicine

## 2022-12-16 ENCOUNTER — Other Ambulatory Visit: Payer: Self-pay | Admitting: Internal Medicine

## 2022-12-16 ENCOUNTER — Other Ambulatory Visit: Payer: Self-pay | Admitting: Nurse Practitioner

## 2022-12-16 DIAGNOSIS — E1165 Type 2 diabetes mellitus with hyperglycemia: Secondary | ICD-10-CM

## 2022-12-16 DIAGNOSIS — I10 Essential (primary) hypertension: Secondary | ICD-10-CM

## 2022-12-30 ENCOUNTER — Telehealth: Payer: Self-pay

## 2022-12-30 DIAGNOSIS — E1165 Type 2 diabetes mellitus with hyperglycemia: Secondary | ICD-10-CM

## 2022-12-30 NOTE — Telephone Encounter (Signed)
Try to call pt back regarding she left message for PA for Trulicity unable to leave message and also gave message to [place] for PA

## 2022-12-31 ENCOUNTER — Other Ambulatory Visit: Payer: Self-pay | Admitting: Nurse Practitioner

## 2022-12-31 MED ORDER — SEMAGLUTIDE(0.25 OR 0.5MG/DOS) 2 MG/3ML ~~LOC~~ SOPN: 0.5000 mg | PEN_INJECTOR | SUBCUTANEOUS | 3 refills | Status: DC

## 2022-12-31 NOTE — Telephone Encounter (Signed)
Try call pt that we send med unable to leave her message

## 2023-01-12 ENCOUNTER — Telehealth: Payer: Self-pay

## 2023-01-15 ENCOUNTER — Other Ambulatory Visit: Payer: Self-pay | Admitting: Nurse Practitioner

## 2023-01-15 ENCOUNTER — Telehealth: Payer: Self-pay

## 2023-01-15 MED ORDER — TRULICITY 1.5 MG/0.5ML ~~LOC~~ SOAJ
1.5000 mg | SUBCUTANEOUS | 5 refills | Status: DC
Start: 2023-01-15 — End: 2023-05-05

## 2023-01-15 NOTE — Telephone Encounter (Signed)
Pt notified about eye exam being negative. No retinopathy.

## 2023-02-11 NOTE — Telephone Encounter (Signed)
Done

## 2023-03-03 ENCOUNTER — Ambulatory Visit (INDEPENDENT_AMBULATORY_CARE_PROVIDER_SITE_OTHER): Payer: 59 | Admitting: Nurse Practitioner

## 2023-03-03 ENCOUNTER — Encounter: Payer: Self-pay | Admitting: Nurse Practitioner

## 2023-03-03 ENCOUNTER — Other Ambulatory Visit: Payer: Self-pay | Admitting: Nurse Practitioner

## 2023-03-03 VITALS — BP 126/68 | HR 92 | Temp 98.3°F | Resp 16 | Ht 61.5 in | Wt 179.6 lb

## 2023-03-03 DIAGNOSIS — M62830 Muscle spasm of back: Secondary | ICD-10-CM | POA: Diagnosis not present

## 2023-03-03 DIAGNOSIS — E1159 Type 2 diabetes mellitus with other circulatory complications: Secondary | ICD-10-CM

## 2023-03-03 DIAGNOSIS — I152 Hypertension secondary to endocrine disorders: Secondary | ICD-10-CM

## 2023-03-03 DIAGNOSIS — Z1212 Encounter for screening for malignant neoplasm of rectum: Secondary | ICD-10-CM

## 2023-03-03 DIAGNOSIS — I10 Essential (primary) hypertension: Secondary | ICD-10-CM

## 2023-03-03 DIAGNOSIS — Z1211 Encounter for screening for malignant neoplasm of colon: Secondary | ICD-10-CM

## 2023-03-03 DIAGNOSIS — E1165 Type 2 diabetes mellitus with hyperglycemia: Secondary | ICD-10-CM

## 2023-03-03 DIAGNOSIS — Z0000 Encounter for general adult medical examination without abnormal findings: Secondary | ICD-10-CM

## 2023-03-03 DIAGNOSIS — F411 Generalized anxiety disorder: Secondary | ICD-10-CM

## 2023-03-03 MED ORDER — ESCITALOPRAM OXALATE 5 MG PO TABS
5.0000 mg | ORAL_TABLET | Freq: Every day | ORAL | 1 refills | Status: AC
Start: 2023-03-03 — End: ?

## 2023-03-03 MED ORDER — BUPROPION HCL ER (XL) 150 MG PO TB24: 150.0000 mg | ORAL_TABLET | Freq: Every day | ORAL | 2 refills | Status: DC

## 2023-03-03 MED ORDER — OZEMPIC (1 MG/DOSE) 4 MG/3ML ~~LOC~~ SOPN
1.0000 mg | PEN_INJECTOR | SUBCUTANEOUS | 5 refills | Status: DC
Start: 2023-03-03 — End: 2023-05-05

## 2023-03-03 MED ORDER — EMPAGLIFLOZIN 25 MG PO TABS
25.0000 mg | ORAL_TABLET | Freq: Every day | ORAL | 5 refills | Status: AC
Start: 2023-03-03 — End: ?

## 2023-03-03 MED ORDER — POTASSIUM CHLORIDE ER 20 MEQ PO TBCR
1.0000 | EXTENDED_RELEASE_TABLET | Freq: Every day | ORAL | 1 refills | Status: AC
Start: 2023-03-03 — End: ?

## 2023-03-03 MED ORDER — CYCLOBENZAPRINE HCL 5 MG PO TABS
ORAL_TABLET | ORAL | 1 refills | Status: DC
Start: 2023-03-03 — End: 2023-08-16

## 2023-03-03 NOTE — Progress Notes (Signed)
[facility] [address] [place], [place] [address]  Internal MEDICINE  Office Visit Note  Patient Name: Sherry Torres  [id number]  [id number]  Date of Service: 03/03/2023  Chief Complaint  Patient presents with   Depression   Diabetes   Hypertension   Hyperlipidemia   Annual Exam    Needs acr and colonoscopy.    HPI Sherry Torres presents for an annual well visit and physical exam.  Well-appearing 62 y.o. female with hypertension, CAD, chronic heart failure, mild intermittent asthma, type 2 diabetes, osteoarthritis, anxiety and depression.  Routine CRC screening: opt for cologuard  Routine mammogram: done in February  Pap smear: due December 2025 Eye exam: due now  foot exam: done today Labs: routine labs done in June this year  New or worsening pain: none Other concerns: Father in law just passed away, grieving appropriately   Current Medication: Outpatient Encounter Medications as of 03/03/2023  Medication Sig   albuterol (VENTOLIN HFA) 108 (90 Base) MCG/ACT inhaler Inhale 1-2 puffs into the lungs daily.   atorvastatin (LIPITOR) 80 MG tablet TAKE 1 TABLET BY MOUTH DAILY AT 6 PM.   bisoprolol (ZEBETA) 5 MG tablet Take 1 tablet (5 mg total) by mouth daily.   clopidogrel (PLAVIX) 75 MG tablet Take 1 tablet (75 mg total) by mouth daily with breakfast.   clotrimazole-betamethasone (LOTRISONE) cream Apply 1 Application topically 2 (two) times daily. To affected area until resolved.   Dulaglutide (TRULICITY) 1.5 MG/0.5ML SOPN Inject 1.5 mg into the skin once a week.   furosemide (LASIX) 20 MG tablet TAKE 1 TABLET BY MOUTH EVERY DAY AS NEEDED   hydrochlorothiazide (HYDRODIURIL) 25 MG tablet Take 1 tablet (25 mg total) by mouth daily.   isosorbide mononitrate (IMDUR) 60 MG 24 hr tablet TAKE 1 & 1/2 TABLETS (90 MG TOTAL) BY MOUTH IN THE MORNING AND 1 & 1/2 TAB AT BEDTIME.   nitroGLYCERIN (NITROSTAT) 0.4 MG SL tablet Place 1 tablet (0.4 mg total) under the tongue  every 5 (five) minutes as needed for chest pain.   olmesartan (BENICAR) 5 MG tablet TAKE 1 TABLET (5 MG TOTAL) BY MOUTH DAILY.   Semaglutide, 1 MG/DOSE, (OZEMPIC, 1 MG/DOSE,) 4 MG/3ML SOPN Inject 1 mg into the skin once a week.   [DISCONTINUED] buPROPion (WELLBUTRIN XL) 150 MG 24 hr tablet TAKE 1 TABLET BY MOUTH EVERY DAY   [DISCONTINUED] cyclobenzaprine (FLEXERIL) 5 MG tablet TAKE 1 TABLET BY MOUTH THREE TIMES A DAY AS NEEDED FOR MUSCLE SPASMS   [DISCONTINUED] empagliflozin (JARDIANCE) 25 MG TABS tablet Take 1 tablet (25 mg total) by mouth daily before breakfast.   [DISCONTINUED] escitalopram (LEXAPRO) 5 MG tablet Take 1 tablet (5 mg total) by mouth daily.   [DISCONTINUED] Potassium Chloride ER 20 MEQ TBCR Take 1 tablet (20 mEq total) by mouth daily.   buPROPion (WELLBUTRIN XL) 150 MG 24 hr tablet Take 1 tablet (150 mg total) by mouth daily.   cyclobenzaprine (FLEXERIL) 5 MG tablet TAKE 1 TABLET BY MOUTH THREE TIMES A DAY AS NEEDED FOR MUSCLE SPASMS   empagliflozin (JARDIANCE) 25 MG TABS tablet Take 1 tablet (25 mg total) by mouth daily before breakfast.   escitalopram (LEXAPRO) 5 MG tablet Take 1 tablet (5 mg total) by mouth daily.   Potassium Chloride ER 20 MEQ TBCR Take 1 tablet (20 mEq total) by mouth daily.   No facility-administered encounter medications on file as of 03/03/2023.    Surgical History: Past Surgical History:  Procedure Laterality Date  CARDIAC CATHETERIZATION     LEFT HEART CATH AND CORONARY ANGIOGRAPHY N/A 01/06/2018   Procedure: LEFT HEART CATH AND CORONARY ANGIOGRAPHY;  Surgeon: Iran Ouch, MD;  Location: [facility] INVASIVE CV LAB;  Service: Cardiovascular;  Laterality: N/A;    Medical History: Past Medical History:  Diagnosis Date   Anxiety    Asthma    CAD (coronary artery disease)    a. 12/2017 NSTEMI;  b. 12/2017 MV: basal and mid inflat ischemia; c. 12/2017 Cath: LM nl, LAD 30ost, D1 mild dzs, LCX 40ost, 95d, RCA 81m (nondominant). LCX felt to be culprit  but only 1.52mm vessel-->Med rx.   Chronic heart failure with preserved ejection fraction (HFpEF) (HCC)    a. 12/2017 Echo: EF 55-60%, no rwma, Gr1 DD; b. 01/2018 Echo: EF 60-65%, gr2 DD, no rwma, mild MR, nl RV fxn.   Hyperlipidemia LDL goal <70    Hypertension    Recurrent major depressive disorder, in partial remission (HCC) 09/17/2017   Tobacco abuse    Type 2 diabetes mellitus (HCC)     Family History: Family History  Problem Relation Age of Onset   Cancer Mother    Breast cancer Mother 69   Hyperlipidemia Father     Social History   Socioeconomic History   Marital status: Married    Spouse name: Not on file   Number of children: Not on file   Years of education: Not on file   Highest education level: Not on file  Occupational History   Not on file  Tobacco Use   Smoking status: Former    Types: Cigarettes   Smokeless tobacco: Never  Vaping Use   Vaping status: Former  Substance and Sexual Activity   Alcohol use: No   Drug use: No   Sexual activity: Not on file  Other Topics Concern   Not on file  Social History Narrative   Not on file   Social Determinants of Health   Financial Resource Strain: Not on file  Food Insecurity: Not on file  Transportation Needs: Not on file  Physical Activity: Not on file  Stress: Not on file  Social Connections: Not on file  Intimate Partner Violence: Not on file      Review of Systems  Constitutional:  Negative for activity change, appetite change, chills, fatigue, fever and unexpected weight change.  HENT: Negative.  Negative for congestion, ear pain, rhinorrhea, sore throat and trouble swallowing.   Eyes: Negative.   Respiratory: Negative.  Negative for cough, chest tightness, shortness of breath and wheezing.   Cardiovascular: Negative.  Negative for chest pain.  Gastrointestinal: Negative.  Negative for abdominal pain, blood in stool, constipation, diarrhea, nausea and vomiting.  Endocrine: Negative.    Genitourinary: Negative.  Negative for difficulty urinating, dysuria, frequency, hematuria and urgency.  Musculoskeletal: Negative.  Negative for arthralgias, back pain, joint swelling, myalgias and neck pain.  Skin: Negative.  Negative for rash and wound.  Allergic/Immunologic: Negative.  Negative for immunocompromised state.  Neurological: Negative.  Negative for dizziness, seizures, numbness and headaches.  Hematological: Negative.   Psychiatric/Behavioral:  Positive for sleep disturbance. Negative for behavioral problems, self-injury and suicidal ideas. The patient is nervous/anxious.     Vital Signs: BP 126/68   Pulse 92   Temp 98.3 F (36.8 C)   Resp 16   Ht 5' 1.5" (1.562 m)   Wt 179 lb 9.6 oz (81.5 kg)   SpO2 99%   BMI 33.39 kg/m    Physical Exam Vitals  reviewed.  Constitutional:      General: She is not in acute distress.    Appearance: Normal appearance. She is well-developed. She is obese. She is not ill-appearing or diaphoretic.  HENT:     Head: Normocephalic and atraumatic.     Right Ear: Tympanic membrane, ear canal and external ear normal.     Left Ear: Tympanic membrane, ear canal and external ear normal.     Nose: Nose normal. No congestion or rhinorrhea.     Mouth/Throat:     Mouth: Mucous membranes are moist.     Pharynx: Oropharynx is clear. No oropharyngeal exudate or posterior oropharyngeal erythema.  Eyes:     General: No scleral icterus.       Right eye: No discharge.        Left eye: No discharge.     Conjunctiva/sclera: Conjunctivae normal.     Pupils: Pupils are equal, round, and reactive to light.  Neck:     Thyroid: No thyromegaly.     Vascular: No carotid bruit or JVD.     Trachea: No tracheal deviation.  Cardiovascular:     Rate and Rhythm: Normal rate and regular rhythm.     Pulses: Normal pulses.          Dorsalis pedis pulses are 2+ on the right side and 2+ on the left side.       Posterior tibial pulses are 2+ on the right side and  2+ on the left side.     Heart sounds: Normal heart sounds. No murmur heard.    No friction rub. No gallop.  Pulmonary:     Effort: Pulmonary effort is normal. No respiratory distress.     Breath sounds: Normal breath sounds. No stridor. No wheezing or rales.  Chest:     Chest wall: No tenderness.     Comments: Declined clinical breast exam, mammogram ordered today Abdominal:     General: Bowel sounds are normal. There is no distension.     Palpations: Abdomen is soft. There is no mass.     Tenderness: There is no abdominal tenderness. There is no guarding or rebound.  Musculoskeletal:        General: No tenderness or deformity. Normal range of motion.     Cervical back: Normal range of motion and neck supple.  Feet:     Right foot:     Protective Sensation: 6 sites tested.  6 sites sensed.     Skin integrity: Callus and dry skin present.     Toenail Condition: Right toenails are abnormally thick.     Left foot:     Protective Sensation: 6 sites tested.  6 sites sensed.     Skin integrity: Callus and dry skin present.     Toenail Condition: Left toenails are abnormally thick.  Lymphadenopathy:     Cervical: No cervical adenopathy.  Skin:    General: Skin is warm and dry.     Capillary Refill: Capillary refill takes less than 2 seconds.     Coloration: Skin is not pale.     Findings: No erythema or rash.     Comments: No suspicious moles or lesions noted.   Neurological:     Mental Status: She is alert and oriented to person, place, and time.     Cranial Nerves: No cranial nerve deficit.     Motor: No abnormal muscle tone.     Coordination: Coordination normal.     Deep Tendon Reflexes: Reflexes  are normal and symmetric.  Psychiatric:        Mood and Affect: Mood normal.        Behavior: Behavior normal.        Thought Content: Thought content normal.        Judgment: Judgment normal.        Assessment/Plan: 1. Type 2 diabetes mellitus with other circulatory  complication, without long-term current use of insulin (HCC) Continue ozempic and jardiance as prescribed.  - Semaglutide, 1 MG/DOSE, (OZEMPIC, 1 MG/DOSE,) 4 MG/3ML SOPN; Inject 1 mg into the skin once a week.  Dispense: 3 mL; Refill: 5 - empagliflozin (JARDIANCE) 25 MG TABS tablet; Take 1 tablet (25 mg total) by mouth daily before breakfast.  Dispense: 30 tablet; Refill: 5  2. Hypertension associated with diabetes (HCC) Stable, continue medications as prescribed.  - Potassium Chloride ER 20 MEQ TBCR; Take 1 tablet (20 mEq total) by mouth daily.  Dispense: 90 tablet; Refill: 1  3. Muscle spasm of back Continue prn cyclobenzaprine as prescribed.  - cyclobenzaprine (FLEXERIL) 5 MG tablet; TAKE 1 TABLET BY MOUTH THREE TIMES A DAY AS NEEDED FOR MUSCLE SPASMS  Dispense: 90 tablet; Refill: 1  4. Screening for colorectal cancer Cologuard test ordered - Cologuard  5. GAD (generalized anxiety disorder) Stable, continue bupropion and lexapro as prescribed.  - buPROPion (WELLBUTRIN XL) 150 MG 24 hr tablet; Take 1 tablet (150 mg total) by mouth daily.  Dispense: 90 tablet; Refill: 2 - escitalopram (LEXAPRO) 5 MG tablet; Take 1 tablet (5 mg total) by mouth daily.  Dispense: 90 tablet; Refill: 1      General Counseling: Sherry Torres verbalizes understanding of the findings of todays visit and agrees with plan of treatment. I have discussed any further diagnostic evaluation that may be needed or ordered today. We also reviewed her medications today. she has been encouraged to call the office with any questions or concerns that should arise related to todays visit.    Orders Placed This Encounter  Procedures   Cologuard    Meds ordered this encounter  Medications   Semaglutide, 1 MG/DOSE, (OZEMPIC, 1 MG/DOSE,) 4 MG/3ML SOPN    Sig: Inject 1 mg into the skin once a week.    Dispense:  3 mL    Refill:  5    Dx code E11.65   empagliflozin (JARDIANCE) 25 MG TABS tablet    Sig: Take 1 tablet (25 mg  total) by mouth daily before breakfast.    Dispense:  30 tablet    Refill:  5    Dx code E11.65. patient has already tried metformin, farxiga, insulin and trulicity   buPROPion (WELLBUTRIN XL) 150 MG 24 hr tablet    Sig: Take 1 tablet (150 mg total) by mouth daily.    Dispense:  90 tablet    Refill:  2   Potassium Chloride ER 20 MEQ TBCR    Sig: Take 1 tablet (20 mEq total) by mouth daily.    Dispense:  90 tablet    Refill:  1   cyclobenzaprine (FLEXERIL) 5 MG tablet    Sig: TAKE 1 TABLET BY MOUTH THREE TIMES A DAY AS NEEDED FOR MUSCLE SPASMS    Dispense:  90 tablet    Refill:  1    For future refills   escitalopram (LEXAPRO) 5 MG tablet    Sig: Take 1 tablet (5 mg total) by mouth daily.    Dispense:  90 tablet    Refill:  1  Return in about 2 months (around 05/03/2023) for F/U, Recheck A1C, Sherry Torres PCP and ACR.   Total time spent:30 Minutes Time spent includes review of chart, medications, test results, and follow up plan with the patient.   [place] Controlled Substance Database was reviewed by me.  This patient was seen by Sallyanne Kuster, FNP-C in collaboration with Dr. Beverely Risen as a part of collaborative care agreement.  Sherry Torres R. Tedd Sias, MSN, FNP-C Internal medicine

## 2023-03-03 NOTE — Telephone Encounter (Signed)
Need PA

## 2023-03-09 ENCOUNTER — Telehealth: Payer: Self-pay

## 2023-03-09 NOTE — Telephone Encounter (Signed)
Completed P.A. for patient's Trulicity.

## 2023-04-18 ENCOUNTER — Encounter: Payer: Self-pay | Admitting: Nurse Practitioner

## 2023-05-05 ENCOUNTER — Ambulatory Visit (INDEPENDENT_AMBULATORY_CARE_PROVIDER_SITE_OTHER): Payer: 59 | Admitting: Nurse Practitioner

## 2023-05-05 ENCOUNTER — Encounter: Payer: Self-pay | Admitting: Nurse Practitioner

## 2023-05-05 VITALS — BP 118/68 | HR 70 | Temp 96.8°F | Resp 16 | Ht 61.5 in | Wt 177.6 lb

## 2023-05-05 DIAGNOSIS — E1169 Type 2 diabetes mellitus with other specified complication: Secondary | ICD-10-CM

## 2023-05-05 DIAGNOSIS — I152 Hypertension secondary to endocrine disorders: Secondary | ICD-10-CM | POA: Diagnosis not present

## 2023-05-05 DIAGNOSIS — E785 Hyperlipidemia, unspecified: Secondary | ICD-10-CM

## 2023-05-05 DIAGNOSIS — E1159 Type 2 diabetes mellitus with other circulatory complications: Secondary | ICD-10-CM | POA: Diagnosis not present

## 2023-05-05 LAB — POCT GLYCOSYLATED HEMOGLOBIN (HGB A1C): Hemoglobin A1C: 5.6 % (ref 4.0–5.6)

## 2023-05-05 MED ORDER — OZEMPIC (1 MG/DOSE) 4 MG/3ML ~~LOC~~ SOPN
1.0000 mg | PEN_INJECTOR | SUBCUTANEOUS | 1 refills | Status: DC
Start: 2023-05-05 — End: 2023-09-01

## 2023-05-05 NOTE — Progress Notes (Signed)
[facility] [address] [place], [place] [address]  Internal MEDICINE  Office Visit Note  Patient Name: Sherry Torres  [id number]  [id number]  Date of Service: 05/05/2023  Chief Complaint  Patient presents with   Depression   Diabetes   Hypertension   Hyperlipidemia   Follow-up   Quality Metric Gaps    Colonoscopy    HPI Sherry Torres presents for a follow-up visit for diabetes, hypertension and hyperlipidemia.  Diabetes -- A1c remains stable and within normal limits.  Hypertension -- controlled with olmesartan, furosemide, hydrochlorothiazide, and bisoprolol.  Hyperlipidemia -- takes max dose atorvastatin.     Current Medication: Outpatient Encounter Medications as of 05/05/2023  Medication Sig   albuterol (VENTOLIN HFA) 108 (90 Base) MCG/ACT inhaler Inhale 1-2 puffs into the lungs daily.   atorvastatin (LIPITOR) 80 MG tablet TAKE 1 TABLET BY MOUTH DAILY AT 6 PM.   bisoprolol (ZEBETA) 5 MG tablet Take 1 tablet (5 mg total) by mouth daily.   buPROPion (WELLBUTRIN XL) 150 MG 24 hr tablet Take 1 tablet (150 mg total) by mouth daily.   clopidogrel (PLAVIX) 75 MG tablet Take 1 tablet (75 mg total) by mouth daily with breakfast.   clotrimazole-betamethasone (LOTRISONE) cream Apply 1 Application topically 2 (two) times daily. To affected area until resolved.   cyclobenzaprine (FLEXERIL) 5 MG tablet TAKE 1 TABLET BY MOUTH THREE TIMES A DAY AS NEEDED FOR MUSCLE SPASMS   empagliflozin (JARDIANCE) 25 MG TABS tablet Take 1 tablet (25 mg total) by mouth daily before breakfast.   escitalopram (LEXAPRO) 5 MG tablet Take 1 tablet (5 mg total) by mouth daily.   furosemide (LASIX) 20 MG tablet TAKE 1 TABLET BY MOUTH EVERY DAY AS NEEDED   hydrochlorothiazide (HYDRODIURIL) 25 MG tablet Take 1 tablet (25 mg total) by mouth daily.   isosorbide mononitrate (IMDUR) 60 MG 24 hr tablet TAKE 1 & 1/2 TABLETS (90 MG TOTAL) BY MOUTH IN THE MORNING AND 1 & 1/2 TAB AT BEDTIME.    nitroGLYCERIN (NITROSTAT) 0.4 MG SL tablet Place 1 tablet (0.4 mg total) under the tongue every 5 (five) minutes as needed for chest pain.   olmesartan (BENICAR) 5 MG tablet TAKE 1 TABLET (5 MG TOTAL) BY MOUTH DAILY.   Potassium Chloride ER 20 MEQ TBCR Take 1 tablet (20 mEq total) by mouth daily.   [DISCONTINUED] Dulaglutide (TRULICITY) 1.5 MG/0.5ML SOPN Inject 1.5 mg into the skin once a week.   [DISCONTINUED] Semaglutide, 1 MG/DOSE, (OZEMPIC, 1 MG/DOSE,) 4 MG/3ML SOPN Inject 1 mg into the skin once a week.   Semaglutide, 1 MG/DOSE, (OZEMPIC, 1 MG/DOSE,) 4 MG/3ML SOPN Inject 1 mg into the skin once a week.   No facility-administered encounter medications on file as of 05/05/2023.    Surgical History: Past Surgical History:  Procedure Laterality Date   CARDIAC CATHETERIZATION     LEFT HEART CATH AND CORONARY ANGIOGRAPHY N/A 01/06/2018   Procedure: LEFT HEART CATH AND CORONARY ANGIOGRAPHY;  Surgeon: Iran Ouch, MD;  Location: [facility] INVASIVE CV LAB;  Service: Cardiovascular;  Laterality: N/A;    Medical History: Past Medical History:  Diagnosis Date   Anxiety    Asthma    CAD (coronary artery disease)    a. 12/2017 NSTEMI;  b. 12/2017 MV: basal and mid inflat ischemia; c. 12/2017 Cath: LM nl, LAD 30ost, D1 mild dzs, LCX 40ost, 95d, RCA 94m (nondominant). LCX felt to be culprit but only 1.61mm vessel-->Med rx.   Chronic heart failure with preserved ejection  fraction (HFpEF) (HCC)    a. 12/2017 Echo: EF 55-60%, no rwma, Gr1 DD; b. 01/2018 Echo: EF 60-65%, gr2 DD, no rwma, mild MR, nl RV fxn.   Hyperlipidemia LDL goal <70    Hypertension    Recurrent major depressive disorder, in partial remission (HCC) 09/17/2017   Tobacco abuse    Type 2 diabetes mellitus (HCC)     Family History: Family History  Problem Relation Age of Onset   Cancer Mother    Breast cancer Mother 58   Hyperlipidemia Father     Social History   Socioeconomic History   Marital status: Married    Spouse name:  Not on file   Number of children: Not on file   Years of education: Not on file   Highest education level: Not on file  Occupational History   Not on file  Tobacco Use   Smoking status: Former    Types: Cigarettes   Smokeless tobacco: Never  Vaping Use   Vaping status: Former  Substance and Sexual Activity   Alcohol use: No   Drug use: No   Sexual activity: Not on file  Other Topics Concern   Not on file  Social History Narrative   Not on file   Social Drivers of Health   Financial Resource Strain: Not on file  Food Insecurity: Not on file  Transportation Needs: Not on file  Physical Activity: Not on file  Stress: Not on file  Social Connections: Not on file  Intimate Partner Violence: Not on file      Review of Systems  Constitutional:  Negative for activity change, appetite change, chills, fatigue, fever and unexpected weight change.  HENT: Negative.  Negative for congestion, ear pain, rhinorrhea, sore throat and trouble swallowing.   Eyes: Negative.   Respiratory: Negative.  Negative for cough, chest tightness, shortness of breath and wheezing.   Cardiovascular: Negative.  Negative for chest pain.  Gastrointestinal: Negative.  Negative for abdominal pain, blood in stool, constipation, diarrhea, nausea and vomiting.  Endocrine: Negative.   Genitourinary: Negative.  Negative for difficulty urinating, dysuria, frequency, hematuria and urgency.  Musculoskeletal: Negative.  Negative for arthralgias, back pain, joint swelling, myalgias and neck pain.  Skin: Negative.  Negative for rash and wound.  Allergic/Immunologic: Negative.  Negative for immunocompromised state.  Neurological: Negative.  Negative for dizziness, seizures, numbness and headaches.  Hematological: Negative.   Psychiatric/Behavioral:  Positive for depression and sleep disturbance. Negative for behavioral problems, self-injury and suicidal ideas. The patient is nervous/anxious.     Vital Signs: BP  118/68   Pulse 70   Temp (!) 96.8 F (36 C)   Resp 16   Ht 5' 1.5" (1.562 m)   Wt 177 lb 9.6 oz (80.6 kg)   SpO2 97%   BMI 33.01 kg/m    Physical Exam Vitals reviewed.  Constitutional:      Appearance: Normal appearance. She is obese.  HENT:     Head: Normocephalic and atraumatic.  Eyes:     Pupils: Pupils are equal, round, and reactive to light.  Cardiovascular:     Rate and Rhythm: Normal rate and regular rhythm.  Pulmonary:     Effort: Pulmonary effort is normal.  Neurological:     Mental Status: She is alert and oriented to person, place, and time.  Psychiatric:        Mood and Affect: Mood normal.        Behavior: Behavior normal.  Assessment/Plan: 1. Type 2 diabetes mellitus with other circulatory complication, without long-term current use of insulin (HCC) (Primary) Urine sent for microalbumin/creatinine ratio. A1c is stable and in normal limits. Trulicity was previously discontinued. Waiting for ozempic prior authorization, ozempic reordered.  - POCT glycosylated hemoglobin (Hb A1C) - Urine Microalbumin w/creat. ratio - Semaglutide, 1 MG/DOSE, (OZEMPIC, 1 MG/DOSE,) 4 MG/3ML SOPN; Inject 1 mg into the skin once a week.  Dispense: 9 mL; Refill: 1  2. Hypertension associated with diabetes (HCC) Continue olmesartan, hydrochlorothiazide, furosemide, and bisoprolol as prescribed.   3. Hyperlipidemia associated with type 2 diabetes mellitus (HCC) Continue atorvastatin as prescribed.    General Counseling: Betsie verbalizes understanding of the findings of todays visit and agrees with plan of treatment. I have discussed any further diagnostic evaluation that may be needed or ordered today. We also reviewed her medications today. she has been encouraged to call the office with any questions or concerns that should arise related to todays visit.    Orders Placed This Encounter  Procedures   Urine Microalbumin w/creat. ratio   POCT glycosylated hemoglobin  (Hb A1C)    Meds ordered this encounter  Medications   Semaglutide, 1 MG/DOSE, (OZEMPIC, 1 MG/DOSE,) 4 MG/3ML SOPN    Sig: Inject 1 mg into the skin once a week.    Dispense:  9 mL    Refill:  1    Dx code E11.65, please send prior authorization request to fax # [phone] asap. Trulicity is discontinued, she is switching to ozempic.    Return in about 4 months (around 09/03/2023) for F/U, Recheck A1C, Aadin Gaut PCP.   Total time spent:30 Minutes Time spent includes review of chart, medications, test results, and follow up plan with the patient.   [place] Controlled Substance Database was reviewed by me.  This patient was seen by Sallyanne Kuster, FNP-C in collaboration with Dr. Beverely Risen as a part of collaborative care agreement.   Monquie Fulgham R. Tedd Sias, MSN, FNP-C Internal medicine

## 2023-05-06 LAB — MICROALBUMIN / CREATININE URINE RATIO
Creatinine, Urine: 95.1 mg/dL
Microalb/Creat Ratio: 5 mg/g{creat} (ref 0–29)
Microalbumin, Urine: 5.1 ug/mL

## 2023-06-09 ENCOUNTER — Other Ambulatory Visit: Payer: Self-pay | Admitting: Nurse Practitioner

## 2023-06-09 MED ORDER — AZITHROMYCIN 250 MG PO TABS: ORAL_TABLET | ORAL | 0 refills | Status: AC

## 2023-06-30 ENCOUNTER — Other Ambulatory Visit: Payer: Self-pay | Admitting: Nurse Practitioner

## 2023-06-30 DIAGNOSIS — E1165 Type 2 diabetes mellitus with hyperglycemia: Secondary | ICD-10-CM

## 2023-07-07 ENCOUNTER — Telehealth: Payer: Self-pay | Admitting: Nurse Practitioner

## 2023-07-14 ENCOUNTER — Telehealth: Payer: Self-pay

## 2023-07-14 NOTE — Telephone Encounter (Signed)
 Pt advised that sample ready for pickup for Ozempic  and as per advised as per alyssa start with 0.5 mg every week

## 2023-07-15 ENCOUNTER — Other Ambulatory Visit: Payer: Self-pay | Admitting: Nurse Practitioner

## 2023-07-15 DIAGNOSIS — F411 Generalized anxiety disorder: Secondary | ICD-10-CM

## 2023-07-16 NOTE — Telephone Encounter (Signed)
 error

## 2023-08-05 ENCOUNTER — Other Ambulatory Visit: Payer: Self-pay | Admitting: Nurse Practitioner

## 2023-08-05 DIAGNOSIS — I2510 Atherosclerotic heart disease of native coronary artery without angina pectoris: Secondary | ICD-10-CM

## 2023-08-13 ENCOUNTER — Other Ambulatory Visit: Payer: Self-pay | Admitting: Internal Medicine

## 2023-08-13 ENCOUNTER — Other Ambulatory Visit: Payer: Self-pay | Admitting: Nurse Practitioner

## 2023-08-13 DIAGNOSIS — M62830 Muscle spasm of back: Secondary | ICD-10-CM

## 2023-08-13 DIAGNOSIS — I10 Essential (primary) hypertension: Secondary | ICD-10-CM

## 2023-08-22 ENCOUNTER — Other Ambulatory Visit: Payer: Self-pay | Admitting: Nurse Practitioner

## 2023-08-22 DIAGNOSIS — E1165 Type 2 diabetes mellitus with hyperglycemia: Secondary | ICD-10-CM

## 2023-09-01 ENCOUNTER — Ambulatory Visit (INDEPENDENT_AMBULATORY_CARE_PROVIDER_SITE_OTHER): Payer: No Typology Code available for payment source | Admitting: Nurse Practitioner

## 2023-09-01 ENCOUNTER — Encounter: Payer: Self-pay | Admitting: Nurse Practitioner

## 2023-09-01 VITALS — BP 120/72 | HR 79 | Temp 97.6°F | Resp 16 | Ht 61.5 in | Wt 183.0 lb

## 2023-09-01 DIAGNOSIS — E1169 Type 2 diabetes mellitus with other specified complication: Secondary | ICD-10-CM

## 2023-09-01 DIAGNOSIS — E1159 Type 2 diabetes mellitus with other circulatory complications: Secondary | ICD-10-CM | POA: Diagnosis not present

## 2023-09-01 DIAGNOSIS — F411 Generalized anxiety disorder: Secondary | ICD-10-CM

## 2023-09-01 DIAGNOSIS — I152 Hypertension secondary to endocrine disorders: Secondary | ICD-10-CM

## 2023-09-01 DIAGNOSIS — E785 Hyperlipidemia, unspecified: Secondary | ICD-10-CM

## 2023-09-01 LAB — POCT GLYCOSYLATED HEMOGLOBIN (HGB A1C): Hemoglobin A1C: 5.8 % — AB (ref 4.0–5.6)

## 2023-09-01 MED ORDER — OZEMPIC (1 MG/DOSE) 4 MG/3ML ~~LOC~~ SOPN
1.0000 mg | PEN_INJECTOR | SUBCUTANEOUS | 1 refills | Status: AC
Start: 2023-09-01 — End: ?

## 2023-09-01 MED ORDER — DAPAGLIFLOZIN PROPANEDIOL 10 MG PO TABS
10.0000 mg | ORAL_TABLET | Freq: Every day | ORAL | 5 refills | Status: DC
Start: 2023-09-01 — End: 2023-12-20

## 2023-09-01 NOTE — Progress Notes (Signed)
 [facility] [address] [place], [place] [address]  Internal MEDICINE  Office Visit Note  Patient Name: Sherry Torres  [id number]  [id number]  Date of Service: 09/01/2023  Chief Complaint  Patient presents with   Diabetes   Depression   Hyperlipidemia   Hypertension   Follow-up    HPI [name] presents for a follow-up visit for diabetes, hypertension, high cholesterol, and GAD.  Diabetes -- A1c is 5.8 today and stable but it is increased slightly and she is out of medication because her copay has increased and it is too expensive.  Hypertension -- controlled with bisoprolol , olmesartan , furosemide , and hydrochlorothiazide   High cholesterol -- taking atorvastatin   GAD -- takes bupropion  and lexapro .     Current Medication: Outpatient Encounter Medications as of 09/01/2023  Medication Sig   albuterol  (VENTOLIN  HFA) 108 (90 Base) MCG/ACT inhaler Inhale 1-2 puffs into the lungs daily.   atorvastatin  (LIPITOR) 80 MG tablet TAKE 1 TABLET BY MOUTH DAILY AT 6 PM.   bisoprolol  (ZEBETA ) 5 MG tablet Take 1 tablet (5 mg total) by mouth daily.   buPROPion  (WELLBUTRIN  XL) 150 MG 24 hr tablet TAKE 1 TABLET BY MOUTH EVERY DAY   clopidogrel  (PLAVIX ) 75 MG tablet TAKE 1 TABLET BY MOUTH DAILY WITH BREAKFAST.   clotrimazole -betamethasone  (LOTRISONE ) cream Apply 1 Application topically 2 (two) times daily. To affected area until resolved.   cyclobenzaprine  (FLEXERIL ) 5 MG tablet TAKE 1 TABLET BY MOUTH THREE TIMES A DAY AS NEEDED FOR MUSCLE SPASM   dapagliflozin  propanediol (FARXIGA ) 10 MG TABS tablet Take 1 tablet (10 mg total) by mouth daily.   empagliflozin  (JARDIANCE ) 25 MG TABS tablet Take 1 tablet (25 mg total) by mouth daily before breakfast.   escitalopram  (LEXAPRO ) 5 MG tablet Take 1 tablet (5 mg total) by mouth daily.   furosemide  (LASIX ) 20 MG tablet TAKE 1 TABLET BY MOUTH EVERY DAY AS NEEDED   hydrochlorothiazide  (HYDRODIURIL ) 25 MG tablet TAKE 1 TABLET (25 MG TOTAL)  BY MOUTH DAILY.   isosorbide  mononitrate (IMDUR ) 60 MG 24 hr tablet TAKE 1 & 1/2 TABLETS (90 MG TOTAL) BY MOUTH IN THE MORNING AND 1 & 1/2 TAB AT BEDTIME.   nitroGLYCERIN  (NITROSTAT ) 0.4 MG SL tablet Place 1 tablet (0.4 mg total) under the tongue every 5 (five) minutes as needed for chest pain.   Potassium Chloride  ER 20 MEQ TBCR Take 1 tablet (20 mEq total) by mouth daily.   [DISCONTINUED] olmesartan  (BENICAR ) 5 MG tablet TAKE 1 TABLET (5 MG TOTAL) BY MOUTH DAILY.   [DISCONTINUED] Semaglutide , 1 MG/DOSE, (OZEMPIC , 1 MG/DOSE,) 4 MG/3ML SOPN Inject 1 mg into the skin once a week.   Semaglutide , 1 MG/DOSE, (OZEMPIC , 1 MG/DOSE,) 4 MG/3ML SOPN Inject 1 mg into the skin once a week.   No facility-administered encounter medications on file as of 09/01/2023.    Surgical History: Past Surgical History:  Procedure Laterality Date   CARDIAC CATHETERIZATION     LEFT HEART CATH AND CORONARY ANGIOGRAPHY N/A 01/06/2018   Procedure: LEFT HEART CATH AND CORONARY ANGIOGRAPHY;  Surgeon: Wenona Hamilton, MD;  Location: [facility] INVASIVE CV LAB;  Service: Cardiovascular;  Laterality: N/A;    Medical History: Past Medical History:  Diagnosis Date   Anxiety    Asthma    CAD (coronary artery disease)    a. 12/2017 NSTEMI;  b. 12/2017 MV: basal and mid inflat ischemia; c. 12/2017 Cath: LM nl, LAD 30ost, D1 mild dzs, LCX 40ost, 95d, RCA 23m (nondominant). LCX felt  to be culprit but only 1.33mm vessel-->Med rx.   Chronic heart failure with preserved ejection fraction (HFpEF) (HCC)    a. 12/2017 Echo: EF 55-60%, no rwma, Gr1 DD; b. 01/2018 Echo: EF 60-65%, gr2 DD, no rwma, mild MR, nl RV fxn.   Hyperlipidemia LDL goal <70    Hypertension    Recurrent major depressive disorder, in partial remission (HCC) 09/17/2017   Tobacco abuse    Type 2 diabetes mellitus (HCC)     Family History: Family History  Problem Relation Age of Onset   Cancer Mother    Breast cancer Mother 33   Hyperlipidemia Father     Social  History   Socioeconomic History   Marital status: Married    Spouse name: Not on file   Number of children: Not on file   Years of education: Not on file   Highest education level: Not on file  Occupational History   Not on file  Tobacco Use   Smoking status: Former    Types: Cigarettes   Smokeless tobacco: Never  Vaping Use   Vaping status: Former  Substance and Sexual Activity   Alcohol use: No   Drug use: No   Sexual activity: Not on file  Other Topics Concern   Not on file  Social History Narrative   Not on file   Social Drivers of Health   Financial Resource Strain: Not on file  Food Insecurity: Not on file  Transportation Needs: Not on file  Physical Activity: Not on file  Stress: Not on file  Social Connections: Not on file  Intimate Partner Violence: Not on file      Review of Systems  Constitutional:  Negative for activity change, appetite change, chills, fatigue, fever and unexpected weight change.  HENT: Negative.  Negative for congestion, ear pain, rhinorrhea, sore throat and trouble swallowing.   Eyes: Negative.   Respiratory: Negative.  Negative for cough, chest tightness, shortness of breath and wheezing.   Cardiovascular: Negative.  Negative for chest pain.  Gastrointestinal: Negative.  Negative for abdominal pain, blood in stool, constipation, diarrhea, nausea and vomiting.  Endocrine: Negative.   Genitourinary: Negative.  Negative for difficulty urinating, dysuria, frequency, hematuria and urgency.  Musculoskeletal: Negative.  Negative for arthralgias, back pain, joint swelling, myalgias and neck pain.  Skin: Negative.  Negative for rash and wound.  Allergic/Immunologic: Negative.  Negative for immunocompromised state.  Neurological: Negative.  Negative for dizziness, seizures, numbness and headaches.  Hematological: Negative.   Psychiatric/Behavioral:  Positive for sleep disturbance. Negative for behavioral problems, self-injury and suicidal  ideas. The patient is nervous/anxious.     Vital Signs: BP 120/72   Pulse 79   Temp 97.6 F (36.4 C)   Resp 16   Ht 5' 1.5" (1.562 m)   Wt 183 lb (83 kg)   SpO2 95%   BMI 34.02 kg/m    Physical Exam Vitals reviewed.  Constitutional:      Appearance: Normal appearance. She is obese.  HENT:     Head: Normocephalic and atraumatic.  Eyes:     Pupils: Pupils are equal, round, and reactive to light.  Cardiovascular:     Rate and Rhythm: Normal rate and regular rhythm.  Pulmonary:     Effort: Pulmonary effort is normal.  Neurological:     Mental Status: She is alert and oriented to person, place, and time.  Psychiatric:        Mood and Affect: Mood normal.  Behavior: Behavior normal.        Assessment/Plan: 1. Type 2 diabetes mellitus with other circulatory complication, without long-term current use of insulin  (HCC) (Primary) A1c is stable at 5.8, continue farxiga  and ozempic  as prescribed.  - POCT glycosylated hemoglobin (Hb A1C) - dapagliflozin  propanediol (FARXIGA ) 10 MG TABS tablet; Take 1 tablet (10 mg total) by mouth daily.  Dispense: 30 tablet; Refill: 5 - Semaglutide , 1 MG/DOSE, (OZEMPIC , 1 MG/DOSE,) 4 MG/3ML SOPN; Inject 1 mg into the skin once a week.  Dispense: 9 mL; Refill: 1  2. Hypertension associated with diabetes (HCC) Stable, continue bisoprolol , hydrochlorothiazide , olmesartan  and furosemide  as prescribed   3. Hyperlipidemia associated with type 2 diabetes mellitus (HCC) Continue atorvastatin  as prescribed.   4. GAD (generalized anxiety disorder) Continue lexapro  and bupropion  as prescribed    General Counseling: Sherry Torres verbalizes understanding of the findings of todays visit and agrees with plan of treatment. I have discussed any further diagnostic evaluation that may be needed or ordered today. We also reviewed her medications today. she has been encouraged to call the office with any questions or concerns that should arise related to  todays visit.    Orders Placed This Encounter  Procedures   POCT glycosylated hemoglobin (Hb A1C)    Meds ordered this encounter  Medications   dapagliflozin  propanediol (FARXIGA ) 10 MG TABS tablet    Sig: Take 1 tablet (10 mg total) by mouth daily.    Dispense:  30 tablet    Refill:  5    Fill new script today dx code E11.65. discontinue jardiance    Semaglutide , 1 MG/DOSE, (OZEMPIC , 1 MG/DOSE,) 4 MG/3ML SOPN    Sig: Inject 1 mg into the skin once a week.    Dispense:  9 mL    Refill:  1    Dx code E11.65    Return in about 4 months (around 01/01/2024) for F/U, Recheck A1C, Sherry Torres PCP.   Total time spent:30 Minutes Time spent includes review of chart, medications, test results, and follow up plan with the patient.   [place] Controlled Substance Database was reviewed by me.  This patient was seen by Laurence Pons, FNP-C in collaboration with Dr. Verneta Gone as a part of collaborative care agreement.   Sherry Torres R. Bobbi Burow, MSN, FNP-C Internal medicine

## 2023-09-17 ENCOUNTER — Other Ambulatory Visit: Payer: Self-pay | Admitting: Internal Medicine

## 2023-09-17 DIAGNOSIS — I10 Essential (primary) hypertension: Secondary | ICD-10-CM

## 2023-09-23 ENCOUNTER — Ambulatory Visit: Admitting: Podiatry

## 2023-09-28 ENCOUNTER — Ambulatory Visit: Admitting: Podiatry

## 2023-09-28 DIAGNOSIS — M7661 Achilles tendinitis, right leg: Secondary | ICD-10-CM

## 2023-09-28 NOTE — Progress Notes (Unsigned)
 Subjective:  Patient ID: Sherry Torres, female    DOB: [birth year],  MRN: [id number]  Chief Complaint  Patient presents with   Foot Pain    Right foot pain on the back of the heel pt stated that it will bother her and get better but then it will come back she stated that it has been going on for about 3-4 weeks     63 y.o. female presents with the above complaint.  Patient presents for right Achilles tendinitis insertional pain hurts with ambulation or shoe pressure he gets better and then comes back using for 3 weeks.  Denies any other acute complaints would like to discuss treatment options for this pain scale 6 out of 10 dull aching nature   Review of Systems: Negative except as noted in the HPI. Denies N/V/F/Ch.  Past Medical History:  Diagnosis Date   Anxiety    Asthma    CAD (coronary artery disease)    a. 12/2017 NSTEMI;  b. 12/2017 MV: basal and mid inflat ischemia; c. 12/2017 Cath: LM nl, LAD 30ost, D1 mild dzs, LCX 40ost, 95d, RCA 82m (nondominant). LCX felt to be culprit but only 1.50mm vessel-->Med rx.   Chronic heart failure with preserved ejection fraction (HFpEF) (HCC)    a. 12/2017 Echo: EF 55-60%, no rwma, Gr1 DD; b. 01/2018 Echo: EF 60-65%, gr2 DD, no rwma, mild MR, nl RV fxn.   Hyperlipidemia LDL goal <70    Hypertension    Recurrent major depressive disorder, in partial remission (HCC) 09/17/2017   Tobacco abuse    Type 2 diabetes mellitus (HCC)     Current Outpatient Medications:    albuterol  (VENTOLIN  HFA) 108 (90 Base) MCG/ACT inhaler, Inhale 1-2 puffs into the lungs daily., Disp: 18 g, Rfl: 3   atorvastatin  (LIPITOR) 80 MG tablet, TAKE 1 TABLET BY MOUTH DAILY AT 6 PM., Disp: 90 tablet, Rfl: 3   bisoprolol  (ZEBETA ) 5 MG tablet, Take 1 tablet (5 mg total) by mouth daily., Disp: 90 tablet, Rfl: 3   buPROPion  (WELLBUTRIN  XL) 150 MG 24 hr tablet, TAKE 1 TABLET BY MOUTH EVERY DAY, Disp: 90 tablet, Rfl: 0   clopidogrel  (PLAVIX ) 75 MG tablet, TAKE 1 TABLET BY MOUTH  DAILY WITH BREAKFAST., Disp: 90 tablet, Rfl: 1   clotrimazole -betamethasone  (LOTRISONE ) cream, Apply 1 Application topically 2 (two) times daily. To affected area until resolved., Disp: 45 g, Rfl: 2   cyclobenzaprine  (FLEXERIL ) 5 MG tablet, TAKE 1 TABLET BY MOUTH THREE TIMES A DAY AS NEEDED FOR MUSCLE SPASM, Disp: 90 tablet, Rfl: 1   dapagliflozin  propanediol (FARXIGA ) 10 MG TABS tablet, Take 1 tablet (10 mg total) by mouth daily., Disp: 30 tablet, Rfl: 5   empagliflozin  (JARDIANCE ) 25 MG TABS tablet, Take 1 tablet (25 mg total) by mouth daily before breakfast., Disp: 30 tablet, Rfl: 5   escitalopram  (LEXAPRO ) 5 MG tablet, Take 1 tablet (5 mg total) by mouth daily., Disp: 90 tablet, Rfl: 1   furosemide  (LASIX ) 20 MG tablet, TAKE 1 TABLET BY MOUTH EVERY DAY AS NEEDED, Disp: 90 tablet, Rfl: 1   hydrochlorothiazide  (HYDRODIURIL ) 25 MG tablet, TAKE 1 TABLET (25 MG TOTAL) BY MOUTH DAILY., Disp: 90 tablet, Rfl: 3   isosorbide  mononitrate (IMDUR ) 60 MG 24 hr tablet, TAKE 1 & 1/2 TABLETS (90 MG TOTAL) BY MOUTH IN THE MORNING AND 1 & 1/2 TAB AT BEDTIME., Disp: 270 tablet, Rfl: 0   nitroGLYCERIN  (NITROSTAT ) 0.4 MG SL tablet, Place 1 tablet (0.4 mg total) under the  tongue every 5 (five) minutes as needed for chest pain., Disp: 30 tablet, Rfl: 2   olmesartan  (BENICAR ) 5 MG tablet, TAKE 1 TABLET (5 MG TOTAL) BY MOUTH DAILY., Disp: 90 tablet, Rfl: 3   Potassium Chloride  ER 20 MEQ TBCR, Take 1 tablet (20 mEq total) by mouth daily., Disp: 90 tablet, Rfl: 1   Semaglutide , 1 MG/DOSE, (OZEMPIC , 1 MG/DOSE,) 4 MG/3ML SOPN, Inject 1 mg into the skin once a week., Disp: 9 mL, Rfl: 1  Social History   Tobacco Use  Smoking Status Former   Types: Cigarettes  Smokeless Tobacco Never    Allergies  Allergen Reactions   Codeine Itching and Other (See Comments)   Objective:  There were no vitals filed for this visit. There is no height or weight on file to calculate BMI. Constitutional Well developed. Well nourished.   Vascular Dorsalis pedis pulses palpable bilaterally. Posterior tibial pulses palpable bilaterally. Capillary refill normal to all digits.  No cyanosis or clubbing noted. Pedal hair growth normal.  Neurologic Normal speech. Oriented to person, place, and time. Epicritic sensation to light touch grossly present bilaterally.  Dermatologic Nails well groomed and normal in appearance. No open wounds. No skin lesions.  Orthopedic: Pain on palpation right Achilles tendon insertion positive Haglund's deformity noted positive Silfverskiold test noted with gastrocnemius equinus.  Pain on palpation to the dorsiflexion of the ankle joint no pain with plantarflexion of the ankle joint.  No pain at the peroneal posterior tibial and ATFL ligament   Radiographs: None Assessment:   1. Right Achilles tendinitis    Plan:  Patient was evaluated and treated and all questions answered.  Right Achilles tendinitis - All questions and concerns were discussed with the patient in extensive detail given the amount of pain that she is having she would benefit from cam boot immobilization to help decrease acute inflammatory component surgical pain.  Patient agrees with the plan like to proceed cam boot immobilization - Cam boot was dispensed  No follow-ups on file.

## 2023-10-07 ENCOUNTER — Encounter: Payer: Self-pay | Admitting: Nurse Practitioner

## 2023-10-26 ENCOUNTER — Ambulatory Visit: Admitting: Podiatry

## 2023-11-03 ENCOUNTER — Encounter: Payer: Self-pay | Admitting: Nurse Practitioner

## 2023-11-03 ENCOUNTER — Ambulatory Visit (INDEPENDENT_AMBULATORY_CARE_PROVIDER_SITE_OTHER): Admitting: Nurse Practitioner

## 2023-11-03 VITALS — BP 130/68 | HR 83 | Temp 98.1°F | Resp 16 | Ht 61.5 in | Wt 188.8 lb

## 2023-11-03 DIAGNOSIS — F411 Generalized anxiety disorder: Secondary | ICD-10-CM | POA: Diagnosis not present

## 2023-11-03 DIAGNOSIS — I2510 Atherosclerotic heart disease of native coronary artery without angina pectoris: Secondary | ICD-10-CM | POA: Diagnosis not present

## 2023-11-03 DIAGNOSIS — E785 Hyperlipidemia, unspecified: Secondary | ICD-10-CM

## 2023-11-03 DIAGNOSIS — E1169 Type 2 diabetes mellitus with other specified complication: Secondary | ICD-10-CM

## 2023-11-03 DIAGNOSIS — E1159 Type 2 diabetes mellitus with other circulatory complications: Secondary | ICD-10-CM

## 2023-11-03 DIAGNOSIS — I152 Hypertension secondary to endocrine disorders: Secondary | ICD-10-CM

## 2023-11-03 MED ORDER — OZEMPIC (1 MG/DOSE) 4 MG/3ML ~~LOC~~ SOPN: 1.0000 mg | PEN_INJECTOR | SUBCUTANEOUS | 1 refills | Status: DC

## 2023-11-03 MED ORDER — ATORVASTATIN CALCIUM 80 MG PO TABS
ORAL_TABLET | ORAL | 3 refills | Status: AC
Start: 2023-11-03 — End: ?

## 2023-11-03 MED ORDER — CLOPIDOGREL BISULFATE 75 MG PO TABS
75.0000 mg | ORAL_TABLET | Freq: Every day | ORAL | 1 refills | Status: DC
Start: 2023-11-03 — End: 2024-03-08

## 2023-11-03 MED ORDER — POTASSIUM CHLORIDE ER 20 MEQ PO TBCR: 1.0000 | EXTENDED_RELEASE_TABLET | Freq: Every day | ORAL | 1 refills | Status: DC

## 2023-11-03 MED ORDER — BUPROPION HCL ER (XL) 150 MG PO TB24: 150.0000 mg | ORAL_TABLET | Freq: Every day | ORAL | 0 refills | Status: DC

## 2023-11-03 MED ORDER — BISOPROLOL FUMARATE 5 MG PO TABS
5.0000 mg | ORAL_TABLET | Freq: Every day | ORAL | 3 refills | Status: AC
Start: 2023-11-03 — End: ?

## 2023-11-03 MED ORDER — DAPAGLIFLOZIN PROPANEDIOL 10 MG PO TABS
10.0000 mg | ORAL_TABLET | Freq: Every day | ORAL | 5 refills | Status: DC
Start: 2023-11-03 — End: 2023-12-20

## 2023-11-03 MED ORDER — ESCITALOPRAM OXALATE 5 MG PO TABS
5.0000 mg | ORAL_TABLET | Freq: Every day | ORAL | 1 refills | Status: DC
Start: 2023-11-03 — End: 2024-03-08

## 2023-11-03 NOTE — Progress Notes (Signed)
 [facility] [address] [place], [place] [address]  Internal MEDICINE  Office Visit Note  Patient Name: Sherry Torres  [id number]  [id number]  Date of Service: 11/03/2023  Chief Complaint  Patient presents with   Depression   Diabetes   Hyperlipidemia   Hypertension   Follow-up    HPI Talene presents for a follow-up visit for diabetes, hypertension and high cholesterol.  Diabetes -- having issues with insurance, pharmacy and her diabetic medications, will switch pharmacy [facility] and see if they will work with her better. Samples of farxiga  and ozempic .  Hypertension -- controlled with current medications  High cholesterol -- taking atorvastatin      Current Medication: Outpatient Encounter Medications as of 11/03/2023  Medication Sig   dapagliflozin  propanediol (FARXIGA ) 10 MG TABS tablet Take 1 tablet (10 mg total) by mouth daily.   albuterol  (VENTOLIN  HFA) 108 (90 Base) MCG/ACT inhaler Inhale 1-2 puffs into the lungs daily.   atorvastatin  (LIPITOR) 80 MG tablet TAKE 1 TABLET BY MOUTH DAILY AT 6 PM.   bisoprolol  (ZEBETA ) 5 MG tablet Take 1 tablet (5 mg total) by mouth daily.   buPROPion  (WELLBUTRIN  XL) 150 MG 24 hr tablet Take 1 tablet (150 mg total) by mouth daily.   clopidogrel  (PLAVIX ) 75 MG tablet Take 1 tablet (75 mg total) by mouth daily with breakfast.   clotrimazole -betamethasone  (LOTRISONE ) cream Apply 1 Application topically 2 (two) times daily. To affected area until resolved.   cyclobenzaprine  (FLEXERIL ) 5 MG tablet TAKE 1 TABLET BY MOUTH THREE TIMES A DAY AS NEEDED FOR MUSCLE SPASM   dapagliflozin  propanediol (FARXIGA ) 10 MG TABS tablet Take 1 tablet (10 mg total) by mouth daily.   escitalopram  (LEXAPRO ) 5 MG tablet Take 1 tablet (5 mg total) by mouth daily.   furosemide  (LASIX ) 20 MG tablet TAKE 1 TABLET BY MOUTH EVERY DAY AS NEEDED   hydrochlorothiazide  (HYDRODIURIL ) 25 MG tablet TAKE 1 TABLET (25 MG TOTAL) BY MOUTH DAILY.   isosorbide   mononitrate (IMDUR ) 60 MG 24 hr tablet TAKE 1 & 1/2 TABLETS (90 MG TOTAL) BY MOUTH IN THE MORNING AND 1 & 1/2 TAB AT BEDTIME.   nitroGLYCERIN  (NITROSTAT ) 0.4 MG SL tablet Place 1 tablet (0.4 mg total) under the tongue every 5 (five) minutes as needed for chest pain.   olmesartan  (BENICAR ) 5 MG tablet TAKE 1 TABLET (5 MG TOTAL) BY MOUTH DAILY.   Potassium Chloride  ER 20 MEQ TBCR Take 1 tablet (20 mEq total) by mouth daily.   Semaglutide , 1 MG/DOSE, (OZEMPIC , 1 MG/DOSE,) 4 MG/3ML SOPN Inject 1 mg into the skin once a week.   [DISCONTINUED] atorvastatin  (LIPITOR) 80 MG tablet TAKE 1 TABLET BY MOUTH DAILY AT 6 PM.   [DISCONTINUED] bisoprolol  (ZEBETA ) 5 MG tablet Take 1 tablet (5 mg total) by mouth daily.   [DISCONTINUED] buPROPion  (WELLBUTRIN  XL) 150 MG 24 hr tablet TAKE 1 TABLET BY MOUTH EVERY DAY   [DISCONTINUED] clopidogrel  (PLAVIX ) 75 MG tablet TAKE 1 TABLET BY MOUTH DAILY WITH BREAKFAST.   [DISCONTINUED] empagliflozin  (JARDIANCE ) 25 MG TABS tablet Take 1 tablet (25 mg total) by mouth daily before breakfast.   [DISCONTINUED] escitalopram  (LEXAPRO ) 5 MG tablet Take 1 tablet (5 mg total) by mouth daily.   [DISCONTINUED] Potassium Chloride  ER 20 MEQ TBCR Take 1 tablet (20 mEq total) by mouth daily.   [DISCONTINUED] Semaglutide , 1 MG/DOSE, (OZEMPIC , 1 MG/DOSE,) 4 MG/3ML SOPN Inject 1 mg into the skin once a week.   No facility-administered encounter medications on  file as of 11/03/2023.    Surgical History: Past Surgical History:  Procedure Laterality Date   CARDIAC CATHETERIZATION     LEFT HEART CATH AND CORONARY ANGIOGRAPHY N/A 01/06/2018   Procedure: LEFT HEART CATH AND CORONARY ANGIOGRAPHY;  Surgeon: Wenona Hamilton, MD;  Location: [facility] INVASIVE CV LAB;  Service: Cardiovascular;  Laterality: N/A;    Medical History: Past Medical History:  Diagnosis Date   Anxiety    Asthma    CAD (coronary artery disease)    a. 12/2017 NSTEMI;  b. 12/2017 MV: basal and mid inflat ischemia; c. 12/2017  Cath: LM nl, LAD 30ost, D1 mild dzs, LCX 40ost, 95d, RCA 7m (nondominant). LCX felt to be culprit but only 1.14mm vessel-->Med rx.   Chronic heart failure with preserved ejection fraction (HFpEF) (HCC)    a. 12/2017 Echo: EF 55-60%, no rwma, Gr1 DD; b. 01/2018 Echo: EF 60-65%, gr2 DD, no rwma, mild MR, nl RV fxn.   Hyperlipidemia LDL goal <70    Hypertension    Recurrent major depressive disorder, in partial remission (HCC) 09/17/2017   Tobacco abuse    Type 2 diabetes mellitus (HCC)     Family History: Family History  Problem Relation Age of Onset   Cancer Mother    Breast cancer Mother 17   Hyperlipidemia Father     Social History   Socioeconomic History   Marital status: Married    Spouse name: Not on file   Number of children: Not on file   Years of education: Not on file   Highest education level: Not on file  Occupational History   Not on file  Tobacco Use   Smoking status: Former    Types: Cigarettes   Smokeless tobacco: Never  Vaping Use   Vaping status: Former  Substance and Sexual Activity   Alcohol use: No   Drug use: No   Sexual activity: Not on file  Other Topics Concern   Not on file  Social History Narrative   Not on file   Social Drivers of Health   Financial Resource Strain: Not on file  Food Insecurity: Not on file  Transportation Needs: Not on file  Physical Activity: Not on file  Stress: Not on file  Social Connections: Not on file  Intimate Partner Violence: Not on file      Review of Systems  Constitutional:  Negative for activity change, appetite change, chills, fatigue, fever and unexpected weight change.  HENT: Negative.  Negative for congestion, ear pain, rhinorrhea, sore throat and trouble swallowing.   Eyes: Negative.   Respiratory: Negative.  Negative for cough, chest tightness, shortness of breath and wheezing.   Cardiovascular: Negative.  Negative for chest pain.  Gastrointestinal: Negative.  Negative for abdominal pain, blood  in stool, constipation, diarrhea, nausea and vomiting.  Endocrine: Negative.   Genitourinary: Negative.  Negative for difficulty urinating, dysuria, frequency, hematuria and urgency.  Musculoskeletal: Negative.  Negative for arthralgias, back pain, joint swelling, myalgias and neck pain.  Skin: Negative.  Negative for rash and wound.  Allergic/Immunologic: Negative.  Negative for immunocompromised state.  Neurological: Negative.  Negative for dizziness, seizures, numbness and headaches.  Hematological: Negative.   Psychiatric/Behavioral:  Positive for sleep disturbance. Negative for behavioral problems, self-injury and suicidal ideas. The patient is nervous/anxious.     Vital Signs: BP 130/68   Pulse 83   Temp 98.1 F (36.7 C)   Resp 16   Ht 5' 1.5 (1.562 m)   Wt 188 lb 12.8  oz (85.6 kg)   SpO2 98%   BMI 35.10 kg/m    Physical Exam Vitals reviewed.  Constitutional:      Appearance: Normal appearance. She is obese.  HENT:     Head: Normocephalic and atraumatic.   Eyes:     Pupils: Pupils are equal, round, and reactive to light.    Cardiovascular:     Rate and Rhythm: Normal rate and regular rhythm.  Pulmonary:     Effort: Pulmonary effort is normal.   Neurological:     Mental Status: She is alert and oriented to person, place, and time.   Psychiatric:        Mood and Affect: Mood normal.        Behavior: Behavior normal.        Assessment/Plan: 1. Type 2 diabetes mellitus with other circulatory complication, without long-term current use of insulin  (HCC) (Primary) R[facility] and try the copay cards again.  - dapagliflozin  propanediol (FARXIGA ) 10 MG TABS tablet; Take 1 tablet (10 mg total) by mouth daily.  Dispense: 30 tablet; Refill: 5 - Semaglutide , 1 MG/DOSE, (OZEMPIC , 1 MG/DOSE,) 4 MG/3ML SOPN; Inject 1 mg into the skin once a week.  Dispense: 9 mL; Refill: 1  2. Hypertension associated with diabetes  (HCC) Stable, continue medications as prescribed.  - bisoprolol  (ZEBETA ) 5 MG tablet; Take 1 tablet (5 mg total) by mouth daily.  Dispense: 90 tablet; Refill: 3 - Potassium Chloride  ER 20 MEQ TBCR; Take 1 tablet (20 mEq total) by mouth daily.  Dispense: 90 tablet; Refill: 1  3. Hyperlipidemia associated with type 2 diabetes mellitus (HCC) Continue atorvastatin  as prescribed.  - atorvastatin  (LIPITOR) 80 MG tablet; TAKE 1 TABLET BY MOUTH DAILY AT 6 PM.  Dispense: 90 tablet; Refill: 3  4. Coronary artery disease involving native coronary artery of native heart without angina pectoris Continue plavix  as prescribed.  - clopidogrel  (PLAVIX ) 75 MG tablet; Take 1 tablet (75 mg total) by mouth daily with breakfast.  Dispense: 90 tablet; Refill: 1  5. GAD (generalized anxiety disorder) Continue bupropion  and lexapro  as prescribed.  - escitalopram  (LEXAPRO ) 5 MG tablet; Take 1 tablet (5 mg total) by mouth daily.  Dispense: 90 tablet; Refill: 1 - buPROPion  (WELLBUTRIN  XL) 150 MG 24 hr tablet; Take 1 tablet (150 mg total) by mouth daily.  Dispense: 90 tablet; Refill: 0   General Counseling: Ketty verbalizes understanding of the findings of todays visit and agrees with plan of treatment. I have discussed any further diagnostic evaluation that may be needed or ordered today. We also reviewed her medications today. she has been encouraged to call the office with any questions or concerns that should arise related to todays visit.    No orders of the defined types were placed in this encounter.   Meds ordered this encounter  Medications   dapagliflozin  propanediol (FARXIGA ) 10 MG TABS tablet    Sig: Take 1 tablet (10 mg total) by mouth daily.    Dispense:  30 tablet    Refill:  5    Dx code E11.65 please send prior auth request   escitalopram  (LEXAPRO ) 5 MG tablet    Sig: Take 1 tablet (5 mg total) by mouth daily.    Dispense:  90 tablet    Refill:  1   buPROPion  (WELLBUTRIN  XL) 150 MG 24 hr  tablet    Sig: Take 1 tablet (150 mg total) by mouth daily.  Dispense:  90 tablet    Refill:  0   bisoprolol  (ZEBETA ) 5 MG tablet    Sig: Take 1 tablet (5 mg total) by mouth daily.    Dispense:  90 tablet    Refill:  3    For future refills   atorvastatin  (LIPITOR) 80 MG tablet    Sig: TAKE 1 TABLET BY MOUTH DAILY AT 6 PM.    Dispense:  90 tablet    Refill:  3    For future refills   clopidogrel  (PLAVIX ) 75 MG tablet    Sig: Take 1 tablet (75 mg total) by mouth daily with breakfast.    Dispense:  90 tablet    Refill:  1   Potassium Chloride  ER 20 MEQ TBCR    Sig: Take 1 tablet (20 mEq total) by mouth daily.    Dispense:  90 tablet    Refill:  1   Semaglutide , 1 MG/DOSE, (OZEMPIC , 1 MG/DOSE,) 4 MG/3ML SOPN    Sig: Inject 1 mg into the skin once a week.    Dispense:  9 mL    Refill:  1    Dx code E11.65    Return for previously scheduled, CPE, Nicholos Aloisi PCP in october.   Total time spent:30 Minutes Time spent includes review of chart, medications, test results, and follow up plan with the patient.    Controlled Substance Database was reviewed by me.  This patient was seen by Laurence Pons, FNP-C in collaboration with Dr. Verneta Gone as a part of collaborative care agreement.   Cardarius Senat R. Bobbi Burow, MSN, FNP-C Internal medicine

## 2023-11-09 ENCOUNTER — Ambulatory Visit: Admitting: Podiatry

## 2023-11-09 DIAGNOSIS — M7661 Achilles tendinitis, right leg: Secondary | ICD-10-CM | POA: Diagnosis not present

## 2023-11-09 NOTE — Progress Notes (Unsigned)
 Subjective:  Patient ID: Sherry Torres, female    DOB: 13-Aug-1960,  MRN: [id number]  Chief Complaint  Patient presents with   Foot Pain    Pt stated that it is better than it was     63 y.o. female presents with the above complaint.  Patient presents with follow-up of right Achilles tendinitis insertional pain she states she is doing better.  Her pain is completely gone.  She would like to discuss next treatment plan   Review of Systems: Negative except as noted in the HPI. Denies N/V/F/Ch.  Past Medical History:  Diagnosis Date   Anxiety    Asthma    CAD (coronary artery disease)    a. 12/2017 NSTEMI;  b. 12/2017 MV: basal and mid inflat ischemia; c. 12/2017 Cath: LM nl, LAD 30ost, D1 mild dzs, LCX 40ost, 95d, RCA 60m (nondominant). LCX felt to be culprit but only 1.46mm vessel-->Med rx.   Chronic heart failure with preserved ejection fraction (HFpEF) (HCC)    a. 12/2017 Echo: EF 55-60%, no rwma, Gr1 DD; b. 01/2018 Echo: EF 60-65%, gr2 DD, no rwma, mild MR, nl RV fxn.   Hyperlipidemia LDL goal <70    Hypertension    Recurrent major depressive disorder, in partial remission (HCC) 09/17/2017   Tobacco abuse    Type 2 diabetes mellitus (HCC)     Current Outpatient Medications:    albuterol  (VENTOLIN  HFA) 108 (90 Base) MCG/ACT inhaler, Inhale 1-2 puffs into the lungs daily., Disp: 18 g, Rfl: 3   atorvastatin  (LIPITOR) 80 MG tablet, TAKE 1 TABLET BY MOUTH DAILY AT 6 PM., Disp: 90 tablet, Rfl: 3   bisoprolol  (ZEBETA ) 5 MG tablet, Take 1 tablet (5 mg total) by mouth daily., Disp: 90 tablet, Rfl: 3   buPROPion  (WELLBUTRIN  XL) 150 MG 24 hr tablet, Take 1 tablet (150 mg total) by mouth daily., Disp: 90 tablet, Rfl: 0   clopidogrel  (PLAVIX ) 75 MG tablet, Take 1 tablet (75 mg total) by mouth daily with breakfast., Disp: 90 tablet, Rfl: 1   clotrimazole -betamethasone  (LOTRISONE ) cream, Apply 1 Application topically 2 (two) times daily. To affected area until resolved., Disp: 45 g, Rfl: 2    cyclobenzaprine  (FLEXERIL ) 5 MG tablet, TAKE 1 TABLET BY MOUTH THREE TIMES A DAY AS NEEDED FOR MUSCLE SPASM, Disp: 90 tablet, Rfl: 1   dapagliflozin  propanediol (FARXIGA ) 10 MG TABS tablet, Take 1 tablet (10 mg total) by mouth daily., Disp: 30 tablet, Rfl: 5   dapagliflozin  propanediol (FARXIGA ) 10 MG TABS tablet, Take 1 tablet (10 mg total) by mouth daily., Disp: 30 tablet, Rfl: 5   escitalopram  (LEXAPRO ) 5 MG tablet, Take 1 tablet (5 mg total) by mouth daily., Disp: 90 tablet, Rfl: 1   furosemide  (LASIX ) 20 MG tablet, TAKE 1 TABLET BY MOUTH EVERY DAY AS NEEDED, Disp: 90 tablet, Rfl: 1   hydrochlorothiazide  (HYDRODIURIL ) 25 MG tablet, TAKE 1 TABLET (25 MG TOTAL) BY MOUTH DAILY., Disp: 90 tablet, Rfl: 3   isosorbide  mononitrate (IMDUR ) 60 MG 24 hr tablet, TAKE 1 & 1/2 TABLETS (90 MG TOTAL) BY MOUTH IN THE MORNING AND 1 & 1/2 TAB AT BEDTIME., Disp: 270 tablet, Rfl: 0   nitroGLYCERIN  (NITROSTAT ) 0.4 MG SL tablet, Place 1 tablet (0.4 mg total) under the tongue every 5 (five) minutes as needed for chest pain., Disp: 30 tablet, Rfl: 2   olmesartan  (BENICAR ) 5 MG tablet, TAKE 1 TABLET (5 MG TOTAL) BY MOUTH DAILY., Disp: 90 tablet, Rfl: 3   Potassium Chloride  ER  20 MEQ TBCR, Take 1 tablet (20 mEq total) by mouth daily., Disp: 90 tablet, Rfl: 1   Semaglutide , 1 MG/DOSE, (OZEMPIC , 1 MG/DOSE,) 4 MG/3ML SOPN, Inject 1 mg into the skin once a week., Disp: 9 mL, Rfl: 1  Social History   Tobacco Use  Smoking Status Former   Types: Cigarettes  Smokeless Tobacco Never    Allergies  Allergen Reactions   Codeine Itching and Other (See Comments)   Objective:  There were no vitals filed for this visit. There is no height or weight on file to calculate BMI. Constitutional Well developed. Well nourished.  Vascular Dorsalis pedis pulses palpable bilaterally. Posterior tibial pulses palpable bilaterally. Capillary refill normal to all digits.  No cyanosis or clubbing noted. Pedal hair growth normal.   Neurologic Normal speech. Oriented to person, place, and time. Epicritic sensation to light touch grossly present bilaterally.  Dermatologic Nails well groomed and normal in appearance. No open wounds. No skin lesions.  Orthopedic: No further pain on palpation right Achilles tendon insertion positive Haglund's deformity noted positive Silfverskiold test noted with gastrocnemius equinus.  No further pain on palpation to the dorsiflexion of the ankle joint no pain with plantarflexion of the ankle joint.  No pain at the peroneal posterior tibial and ATFL ligament   Radiographs: None Assessment:   No diagnosis found.  Plan:  Patient was evaluated and treated and all questions answered.  Right Achilles tendinitis -At this time patient is completely healed.  No further pain noted at all.  At this time patient will return to regular activities discussed shoe gear modification orthotics management she states understanding. No follow-ups on file.

## 2023-12-06 ENCOUNTER — Telehealth: Payer: Self-pay

## 2023-12-20 NOTE — Telephone Encounter (Signed)
 Pt.notified

## 2024-01-11 ENCOUNTER — Other Ambulatory Visit: Payer: Self-pay | Admitting: Internal Medicine

## 2024-01-11 ENCOUNTER — Other Ambulatory Visit: Payer: Self-pay | Admitting: Nurse Practitioner

## 2024-01-26 ENCOUNTER — Ambulatory Visit: Attending: Internal Medicine | Admitting: Internal Medicine

## 2024-01-26 ENCOUNTER — Encounter: Payer: Self-pay | Admitting: Internal Medicine

## 2024-01-26 VITALS — BP 110/60 | HR 66 | Ht 61.5 in | Wt 191.0 lb

## 2024-01-26 DIAGNOSIS — E785 Hyperlipidemia, unspecified: Secondary | ICD-10-CM

## 2024-01-26 DIAGNOSIS — E1169 Type 2 diabetes mellitus with other specified complication: Secondary | ICD-10-CM | POA: Diagnosis not present

## 2024-01-26 DIAGNOSIS — R6889 Other general symptoms and signs: Secondary | ICD-10-CM | POA: Insufficient documentation

## 2024-01-26 DIAGNOSIS — Z79899 Other long term (current) drug therapy: Secondary | ICD-10-CM

## 2024-01-26 DIAGNOSIS — I25118 Atherosclerotic heart disease of native coronary artery with other forms of angina pectoris: Secondary | ICD-10-CM

## 2024-01-26 DIAGNOSIS — I10 Essential (primary) hypertension: Secondary | ICD-10-CM

## 2024-01-26 NOTE — Progress Notes (Signed)
  Cardiology Office Note:  .   Date:  01/26/2024  ID:  Sherry Torres, DOB 1960-10-09, MRN [id number] PCP: Liana Fish, NP  [facility] Providers Cardiologist:  Lonni Hanson, MD     History of Present Illness: .   Sherry Torres is a 63 y.o. female with history of coronary artery disease with high-grade disease involving distal LCx and nondominant RCA managed medically, hypertension, hyperlipidemia, diabetes mellitus, and anxiety, who presents for follow-up of coronary artery disease.  I last saw her a year ago, at which time she was feeling well without chest pain or dyspnea.  She noted intermittent lower extremity swelling that was well-controlled with her as needed furosemide .  We did not make any medication changes or pursue additional testing.  Today, [name] reports that she has been feeling well from a heart standpoint, denying chest pain, shortness of breath, palpitations, and lightheadedness.  She has been dealing with foot problems, especially on the right.  She has noticed some leg swelling associated with this.  She is using her as needed furosemide  2-3 times per week.  She continues to work on weight loss but notes that it has been more difficult for her to procure semaglutide , as her insurance has not approved it recently.  She notes quite a bit of stress at work but tries to exercise when possible.  She had some cold intolerance as well and wonders if this could be due to clopidogrel .  ROS: See HPI  Studies Reviewed: SABRA   EKG Interpretation Date/Time:  Wednesday January 26 2024 11:18:53 EDT Ventricular Rate:  66 PR Interval:  178 QRS Duration:  70 QT Interval:  392 QTC Calculation: 410 R Axis:   21  Text Interpretation: Normal sinus rhythm Low voltage QRS When compared with ECG of 23-Oct-2022 No significant change was found Confirmed by Christal Lagerstrom, Lonni [phone]) on 01/26/2024 1:20:19 PM    Risk Assessment/Calculations:             Physical  Exam:   VS:  BP 110/60 (BP Location: Left Arm, Patient Position: Sitting, Cuff Size: Normal)   Pulse 66   Ht 5' 1.5 (1.562 m)   Wt 191 lb (86.6 kg)   SpO2 97%   BMI 35.50 kg/m    Wt Readings from Last 3 Encounters:  01/26/24 191 lb (86.6 kg)  11/03/23 188 lb 12.8 oz (85.6 kg)  09/01/23 183 lb (83 kg)    General:  NAD. Neck: No JVD or HJR. Lungs: Clear to auscultation bilaterally without wheezes or crackles. Heart: Regular rate and rhythm without murmurs, rubs, or gallops. Abdomen: Soft, nontender, nondistended. Extremities: Trace ankle edema bilaterally, left greater than right  ASSESSMENT AND PLAN: .    Coronary artery disease with stable angina: [name] continues to do well without chest pain or shortness of breath.  We will continue her antianginal regimen consisting of bisoprolol  and isosorbide  mononitrate.  Check CBC and CMP today in the setting of long-term clopidogrel  therapy.  Hypertension: Blood pressure well-controlled today.  Continue current regimen of bisoprolol , HCTZ, isosorbide  mononitrate, and olmesartan  for target BP less than 130/80.  Hyperlipidemia associated with type 2 diabetes mellitus: Check lipid panel and CMP today with plans to continue atorvastatin  80 mg daily for target LDL less than 70.  Ongoing management of DM per [name].  Cold intolerance: Longstanding and stable.  Check TSH and CBC today.    Dispo: Return to clinic in 1 year.  Signed, Lonni Hanson, MD

## 2024-01-26 NOTE — Patient Instructions (Signed)
 Medication Instructions:  Your physician recommends that you continue on your current medications as directed. Please refer to the Current Medication list given to you today.    *If you need a refill on your cardiac medications before your next appointment, please call your pharmacy*  Lab Work: Your provider would like for you to have following labs drawn today CBC, CMP, Lipid, TSH.     Testing/Procedures: No test ordered today   Follow-Up: At [facility], you and your health needs are our priority.  As part of our continuing mission to provide you with exceptional heart care, our providers are all part of one team.  This team includes your primary Cardiologist (physician) and Advanced Practice Providers or APPs (Physician Assistants and Nurse Practitioners) who all work together to provide you with the care you need, when you need it.  Your next appointment:   1 year(s)  Provider:   You may see Lonni Hanson, MD or one of the following Advanced Practice Providers on your designated Care Team:   Lonni Meager, NP Lesley Maffucci, PA-C Bernardino Bring, PA-C Cadence Darlington, PA-C Tylene Lunch, NP Barnie Hila, NP

## 2024-01-27 LAB — COMPREHENSIVE METABOLIC PANEL WITH GFR
ALT: 16 IU/L (ref 0–32)
AST: 17 IU/L (ref 0–40)
Albumin: 4.4 g/dL (ref 3.9–4.9)
Alkaline Phosphatase: 65 IU/L (ref 44–121)
BUN/Creatinine Ratio: 16 (ref 12–28)
BUN: 14 mg/dL (ref 8–27)
Bilirubin Total: 0.5 mg/dL (ref 0.0–1.2)
CO2: 24 mmol/L (ref 20–29)
Calcium: 9.2 mg/dL (ref 8.7–10.3)
Chloride: 99 mmol/L (ref 96–106)
Creatinine, Ser: 0.9 mg/dL (ref 0.57–1.00)
Globulin, Total: 2.3 g/dL (ref 1.5–4.5)
Glucose: 73 mg/dL (ref 70–99)
Potassium: 4.1 mmol/L (ref 3.5–5.2)
Sodium: 140 mmol/L (ref 134–144)
Total Protein: 6.7 g/dL (ref 6.0–8.5)
eGFR: 72 mL/min/1.73 (ref >59)

## 2024-01-27 LAB — CBC
Hematocrit: 36.5 % (ref 34.0–46.6)
Hemoglobin: 12 g/dL (ref 11.1–15.9)
MCH: 28.6 pg (ref 26.6–33.0)
MCHC: 32.9 g/dL (ref 31.5–35.7)
MCV: 87 fL (ref 79–97)
Platelets: 228 x10E3/uL (ref 150–450)
RBC: 4.19 x10E6/uL (ref 3.77–5.28)
RDW: 15.4 % (ref 11.7–15.4)
WBC: 7.4 x10E3/uL (ref 3.4–10.8)

## 2024-01-27 LAB — LIPID PANEL
Chol/HDL Ratio: 2.6 ratio (ref 0.0–4.4)
Cholesterol, Total: 109 mg/dL (ref 100–199)
HDL: 42 mg/dL (ref >39)
LDL Chol Calc (NIH): 44 mg/dL (ref 0–99)
Triglycerides: 131 mg/dL (ref 0–149)
VLDL Cholesterol Cal: 23 mg/dL (ref 5–40)

## 2024-01-27 LAB — TSH: TSH: 2.36 u[IU]/mL (ref 0.450–4.500)

## 2024-01-28 ENCOUNTER — Ambulatory Visit: Payer: Self-pay | Admitting: Internal Medicine

## 2024-02-09 ENCOUNTER — Other Ambulatory Visit: Payer: Self-pay | Admitting: Nurse Practitioner

## 2024-02-09 DIAGNOSIS — F411 Generalized anxiety disorder: Secondary | ICD-10-CM

## 2024-03-07 ENCOUNTER — Other Ambulatory Visit: Payer: Self-pay | Admitting: Internal Medicine

## 2024-03-08 ENCOUNTER — Encounter: Payer: Self-pay | Admitting: Nurse Practitioner

## 2024-03-08 ENCOUNTER — Ambulatory Visit (INDEPENDENT_AMBULATORY_CARE_PROVIDER_SITE_OTHER): Payer: No Typology Code available for payment source | Admitting: Nurse Practitioner

## 2024-03-08 VITALS — BP 125/82 | HR 74 | Temp 98.0°F | Resp 16 | Ht 61.5 in | Wt 195.0 lb

## 2024-03-08 DIAGNOSIS — I2510 Atherosclerotic heart disease of native coronary artery without angina pectoris: Secondary | ICD-10-CM

## 2024-03-08 DIAGNOSIS — Z0001 Encounter for general adult medical examination with abnormal findings: Secondary | ICD-10-CM | POA: Diagnosis not present

## 2024-03-08 DIAGNOSIS — I152 Hypertension secondary to endocrine disorders: Secondary | ICD-10-CM

## 2024-03-08 DIAGNOSIS — E1159 Type 2 diabetes mellitus with other circulatory complications: Secondary | ICD-10-CM | POA: Diagnosis not present

## 2024-03-08 DIAGNOSIS — E1169 Type 2 diabetes mellitus with other specified complication: Secondary | ICD-10-CM

## 2024-03-08 DIAGNOSIS — Z1211 Encounter for screening for malignant neoplasm of colon: Secondary | ICD-10-CM

## 2024-03-08 DIAGNOSIS — J4520 Mild intermittent asthma, uncomplicated: Secondary | ICD-10-CM

## 2024-03-08 DIAGNOSIS — Z1231 Encounter for screening mammogram for malignant neoplasm of breast: Secondary | ICD-10-CM

## 2024-03-08 DIAGNOSIS — R300 Dysuria: Secondary | ICD-10-CM

## 2024-03-08 DIAGNOSIS — I5032 Chronic diastolic (congestive) heart failure: Secondary | ICD-10-CM

## 2024-03-08 DIAGNOSIS — F3341 Major depressive disorder, recurrent, in partial remission: Secondary | ICD-10-CM

## 2024-03-08 DIAGNOSIS — E785 Hyperlipidemia, unspecified: Secondary | ICD-10-CM

## 2024-03-08 DIAGNOSIS — F411 Generalized anxiety disorder: Secondary | ICD-10-CM

## 2024-03-08 DIAGNOSIS — Z1212 Encounter for screening for malignant neoplasm of rectum: Secondary | ICD-10-CM

## 2024-03-08 MED ORDER — EMPAGLIFLOZIN 25 MG PO TABS: 25.0000 mg | ORAL_TABLET | Freq: Every day | ORAL | 3 refills | Status: AC

## 2024-03-08 MED ORDER — ALBUTEROL SULFATE HFA 108 (90 BASE) MCG/ACT IN AERS: 1.0000 | INHALATION_SPRAY | Freq: Every day | RESPIRATORY_TRACT | 3 refills | Status: AC

## 2024-03-08 MED ORDER — CLOPIDOGREL BISULFATE 75 MG PO TABS: 75.0000 mg | ORAL_TABLET | Freq: Every day | ORAL | 1 refills | Status: AC

## 2024-03-08 MED ORDER — ESCITALOPRAM OXALATE 5 MG PO TABS: 5.0000 mg | ORAL_TABLET | Freq: Every day | ORAL | 1 refills | Status: DC

## 2024-03-08 MED ORDER — CYCLOBENZAPRINE HCL 5 MG PO TABS: ORAL_TABLET | ORAL | 1 refills | Status: AC

## 2024-03-08 MED ORDER — BUPROPION HCL ER (XL) 150 MG PO TB24: 150.0000 mg | ORAL_TABLET | Freq: Every day | ORAL | 1 refills | Status: AC

## 2024-03-08 MED ORDER — OZEMPIC (1 MG/DOSE) 4 MG/3ML ~~LOC~~ SOPN: 1.0000 mg | PEN_INJECTOR | SUBCUTANEOUS | 1 refills | Status: AC

## 2024-03-08 NOTE — Progress Notes (Signed)
 [facility] [address] [place], [place] [address]  Internal MEDICINE  Office Visit Note  Patient Name: Sherry Torres  [id number]  [id number]  Date of Service: 03/08/2024  Chief Complaint  Patient presents with   Annual Exam   Hyperlipidemia   Hypertension   Depression   Diabetes   Quality Metric Gaps    Eye Exam    HPI Allexus presents for an annual well visit and physical exam.  Well-appearing 63 y.o. female with hypertension, CAD, chronic heart failure, mild intermittent asthma, type 2 diabetes, osteoarthritis, anxiety and depression  Routine CRC screening: opt for cologuard  Routine mammogram: due now  DEXA scan: due in 2 years  Pap smear: possibly due now  Eye exam: due now, need referral   foot exam: done Labs: labs done in September with cardiology. These labs were good. Due for A1c check next week at nurse visit here.  New or worsening pain: none  Other concerns: none     Current Medication: Outpatient Encounter Medications as of 03/08/2024  Medication Sig   atorvastatin  (LIPITOR) 80 MG tablet TAKE 1 TABLET BY MOUTH DAILY AT 6 PM.   bisoprolol  (ZEBETA ) 5 MG tablet Take 1 tablet (5 mg total) by mouth daily.   clotrimazole -betamethasone  (LOTRISONE ) cream Apply 1 Application topically 2 (two) times daily. To affected area until resolved.   empagliflozin  (JARDIANCE ) 25 MG TABS tablet Take 1 tablet (25 mg total) by mouth daily.   furosemide  (LASIX ) 20 MG tablet TAKE 1 TABLET BY MOUTH EVERY DAY AS NEEDED   hydrochlorothiazide  (HYDRODIURIL ) 25 MG tablet TAKE 1 TABLET (25 MG TOTAL) BY MOUTH DAILY.   isosorbide  mononitrate (IMDUR ) 60 MG 24 hr tablet TAKE 1 & 1/2 TABLETS (90 MG TOTAL) BY MOUTH IN THE MORNING AND 1 & 1/2 TAB AT BEDTIME.   metFORMIN  (GLUCOPHAGE ) 500 MG tablet TAKE 1 TABLET BY MOUTH EVERY DAY WITH BREAKFAST   nitroGLYCERIN  (NITROSTAT ) 0.4 MG SL tablet Place 1 tablet (0.4 mg total) under the tongue every 5 (five) minutes as needed for  chest pain.   olmesartan  (BENICAR ) 5 MG tablet TAKE 1 TABLET (5 MG TOTAL) BY MOUTH DAILY.   [DISCONTINUED] albuterol  (VENTOLIN  HFA) 108 (90 Base) MCG/ACT inhaler Inhale 1-2 puffs into the lungs daily.   [DISCONTINUED] buPROPion  (WELLBUTRIN  XL) 150 MG 24 hr tablet TAKE 1 TABLET BY MOUTH EVERY DAY   [DISCONTINUED] clopidogrel  (PLAVIX ) 75 MG tablet Take 1 tablet (75 mg total) by mouth daily with breakfast.   [DISCONTINUED] cyclobenzaprine  (FLEXERIL ) 5 MG tablet TAKE 1 TABLET BY MOUTH THREE TIMES A DAY AS NEEDED FOR MUSCLE SPASM   [DISCONTINUED] escitalopram  (LEXAPRO ) 5 MG tablet Take 1 tablet (5 mg total) by mouth daily.   [DISCONTINUED] Potassium Chloride  ER 20 MEQ TBCR Take 1 tablet (20 mEq total) by mouth daily.   [DISCONTINUED] Semaglutide , 1 MG/DOSE, (OZEMPIC , 1 MG/DOSE,) 4 MG/3ML SOPN Inject 1 mg into the skin once a week.   albuterol  (VENTOLIN  HFA) 108 (90 Base) MCG/ACT inhaler Inhale 1-2 puffs into the lungs daily.   buPROPion  (WELLBUTRIN  XL) 150 MG 24 hr tablet Take 1 tablet (150 mg total) by mouth daily.   clopidogrel  (PLAVIX ) 75 MG tablet Take 1 tablet (75 mg total) by mouth daily with breakfast.   cyclobenzaprine  (FLEXERIL ) 5 MG tablet TAKE 1 TABLET BY MOUTH THREE TIMES A DAY AS NEEDED FOR MUSCLE SPASM   escitalopram  (LEXAPRO ) 5 MG tablet Take 1 tablet (5 mg total) by mouth daily.   Semaglutide , 1 MG/DOSE, (  OZEMPIC , 1 MG/DOSE,) 4 MG/3ML SOPN Inject 1 mg into the skin once a week.   No facility-administered encounter medications on file as of 03/08/2024.    Surgical History: Past Surgical History:  Procedure Laterality Date   CARDIAC CATHETERIZATION     LEFT HEART CATH AND CORONARY ANGIOGRAPHY N/A 01/06/2018   Procedure: LEFT HEART CATH AND CORONARY ANGIOGRAPHY;  Surgeon: Darron Deatrice LABOR, MD;  Location: [facility] INVASIVE CV LAB;  Service: Cardiovascular;  Laterality: N/A;    Medical History: Past Medical History:  Diagnosis Date   Anxiety    Asthma    CAD (coronary artery  disease)    a. 12/2017 NSTEMI;  b. 12/2017 MV: basal and mid inflat ischemia; c. 12/2017 Cath: LM nl, LAD 30ost, D1 mild dzs, LCX 40ost, 95d, RCA 72m (nondominant). LCX felt to be culprit but only 1.44mm vessel-->Med rx.   Chronic heart failure with preserved ejection fraction (HFpEF) (HCC)    a. 12/2017 Echo: EF 55-60%, no rwma, Gr1 DD; b. 01/2018 Echo: EF 60-65%, gr2 DD, no rwma, mild MR, nl RV fxn.   Hyperlipidemia LDL goal <70    Hypertension    Recurrent major depressive disorder, in partial remission 09/17/2017   Tobacco abuse    Type 2 diabetes mellitus (HCC)     Family History: Family History  Problem Relation Age of Onset   Cancer Mother    Breast cancer Mother 29   Hyperlipidemia Father     Social History   Socioeconomic History   Marital status: Married    Spouse name: Not on file   Number of children: Not on file   Years of education: Not on file   Highest education level: Not on file  Occupational History   Not on file  Tobacco Use   Smoking status: Former    Types: Cigarettes   Smokeless tobacco: Never  Vaping Use   Vaping status: Former  Substance and Sexual Activity   Alcohol use: No   Drug use: No   Sexual activity: Not on file  Other Topics Concern   Not on file  Social History Narrative   Not on file   Social Drivers of Health   Financial Resource Strain: Not on file  Food Insecurity: Not on file  Transportation Needs: Not on file  Physical Activity: Not on file  Stress: Not on file  Social Connections: Not on file  Intimate Partner Violence: Not on file      Review of Systems  Constitutional:  Negative for activity change, appetite change, chills, fatigue, fever and unexpected weight change.  HENT: Negative.  Negative for congestion, ear pain, rhinorrhea, sore throat and trouble swallowing.   Eyes: Negative.   Respiratory: Negative.  Negative for cough, chest tightness, shortness of breath and wheezing.   Cardiovascular: Negative.  Negative  for chest pain.  Gastrointestinal: Negative.  Negative for abdominal pain, blood in stool, constipation, diarrhea, nausea and vomiting.  Endocrine: Negative.   Genitourinary: Negative.  Negative for difficulty urinating, dysuria, frequency, hematuria and urgency.  Musculoskeletal: Negative.  Negative for arthralgias, back pain, joint swelling, myalgias and neck pain.  Skin: Negative.  Negative for rash and wound.  Allergic/Immunologic: Negative.  Negative for immunocompromised state.  Neurological: Negative.  Negative for dizziness, seizures, numbness and headaches.  Hematological: Negative.   Psychiatric/Behavioral:  Positive for sleep disturbance. Negative for behavioral problems, self-injury and suicidal ideas. The patient is nervous/anxious.     Vital Signs: BP 125/82   Pulse 74   Temp  98 F (36.7 C)   Resp 16   Ht 5' 1.5 (1.562 m)   Wt 195 lb (88.5 kg)   SpO2 99%   BMI 36.25 kg/m    Physical Exam Vitals reviewed.  Constitutional:      General: She is not in acute distress.    Appearance: Normal appearance. She is well-developed. She is obese. She is not ill-appearing or diaphoretic.  HENT:     Head: Normocephalic and atraumatic.     Right Ear: Tympanic membrane, ear canal and external ear normal.     Left Ear: Tympanic membrane, ear canal and external ear normal.     Nose: Nose normal. No congestion or rhinorrhea.     Mouth/Throat:     Mouth: Mucous membranes are moist.     Pharynx: Oropharynx is clear. No oropharyngeal exudate or posterior oropharyngeal erythema.  Eyes:     General: No scleral icterus.       Right eye: No discharge.        Left eye: No discharge.     Conjunctiva/sclera: Conjunctivae normal.     Pupils: Pupils are equal, round, and reactive to light.  Neck:     Thyroid: No thyromegaly.     Vascular: No carotid bruit or JVD.     Trachea: No tracheal deviation.  Cardiovascular:     Rate and Rhythm: Normal rate and regular rhythm.     Pulses:  Normal pulses.          Dorsalis pedis pulses are 2+ on the right side and 2+ on the left side.       Posterior tibial pulses are 2+ on the right side and 2+ on the left side.     Heart sounds: Normal heart sounds. No murmur heard.    No friction rub. No gallop.  Pulmonary:     Effort: Pulmonary effort is normal. No respiratory distress.     Breath sounds: Normal breath sounds. No stridor. No wheezing or rales.  Chest:     Chest wall: No tenderness.     Comments: Declined clinical breast exam, mammogram ordered today Abdominal:     General: Bowel sounds are normal. There is no distension.     Palpations: Abdomen is soft. There is no mass.     Tenderness: There is no abdominal tenderness. There is no guarding or rebound.  Musculoskeletal:        General: No tenderness or deformity. Normal range of motion.     Cervical back: Normal range of motion and neck supple.  Feet:     Right foot:     Protective Sensation: 6 sites tested.  6 sites sensed.     Skin integrity: Callus and dry skin present.     Toenail Condition: Right toenails are abnormally thick.     Left foot:     Protective Sensation: 6 sites tested.  6 sites sensed.     Skin integrity: Callus and dry skin present.     Toenail Condition: Left toenails are abnormally thick.  Lymphadenopathy:     Cervical: No cervical adenopathy.  Skin:    General: Skin is warm and dry.     Capillary Refill: Capillary refill takes less than 2 seconds.     Coloration: Skin is not pale.     Findings: No erythema or rash.     Comments: No suspicious moles or lesions noted.   Neurological:     Mental Status: She is alert and oriented to person,  place, and time.     Cranial Nerves: No cranial nerve deficit.     Motor: No abnormal muscle tone.     Coordination: Coordination normal.     Deep Tendon Reflexes: Reflexes are normal and symmetric.  Psychiatric:        Mood and Affect: Mood normal.        Behavior: Behavior normal.        Thought  Content: Thought content normal.        Judgment: Judgment normal.        Assessment/Plan: 1. Encounter for routine adult health examination with abnormal findings (Primary) Age-appropriate preventive screenings and vaccinations discussed, annual physical exam completed. Routine labs for health maintenance results discussed with the patient. PHM updated.   - cyclobenzaprine  (FLEXERIL ) 5 MG tablet; TAKE 1 TABLET BY MOUTH THREE TIMES A DAY AS NEEDED FOR MUSCLE SPASM  Dispense: 90 tablet; Refill: 1  2. Type 2 diabetes mellitus with other circulatory complication, without long-term current use of insulin  (HCC) Referred to ophthalmology for annual diabetic eye exams. Jardiance  prescribed and will try to get ozempic  covered by her insurance. Urine sent for microalbumin/creatinine ratio.  - Semaglutide , 1 MG/DOSE, (OZEMPIC , 1 MG/DOSE,) 4 MG/3ML SOPN; Inject 1 mg into the skin once a week.  Dispense: 9 mL; Refill: 1 - empagliflozin  (JARDIANCE ) 25 MG TABS tablet; Take 1 tablet (25 mg total) by mouth daily.  Dispense: 90 tablet; Refill: 3 - Ambulatory referral to Ophthalmology - Urine Microalbumin w/creat. ratio  3. Hypertension associated with diabetes (HCC) Stable, continue bisoprolol , furosemide , and olmesartan  as prescribed.   4. Coronary artery disease involving native coronary artery of native heart without angina pectoris Continue plavix  as prescribed.  - clopidogrel  (PLAVIX ) 75 MG tablet; Take 1 tablet (75 mg total) by mouth daily with breakfast.  Dispense: 90 tablet; Refill: 1  5. Hyperlipidemia associated with type 2 diabetes mellitus (HCC) Continue atorvastatin  80 mg daily as prescribed   6. Chronic heart failure with preserved ejection fraction (HFpEF) (HCC) Continue medications as prescribed.   7. Mild intermittent asthma without complication Continue prn albuterol  as prescribed. Not currently on a maintenance inhaler.  - albuterol  (VENTOLIN  HFA) 108 (90 Base) MCG/ACT inhaler;  Inhale 1-2 puffs into the lungs daily.  Dispense: 18 g; Refill: 3  8. Dysuria Urine sent to lab - UA/M w/rflx Culture, Routine  9. Screening for colorectal cancer Cologuard test ordered  - Cologuard  10. Encounter for screening mammogram for malignant neoplasm of breast Routine mammogram ordered  - MM 3D SCREENING MAMMOGRAM BILATERAL BREAST; Future  11. Recurrent major depressive disorder, in partial remission Continue bupropion  and escitalopram  as prescribed.  - escitalopram  (LEXAPRO ) 5 MG tablet; Take 1 tablet (5 mg total) by mouth daily.  Dispense: 90 tablet; Refill: 1 - buPROPion  (WELLBUTRIN  XL) 150 MG 24 hr tablet; Take 1 tablet (150 mg total) by mouth daily.  Dispense: 90 tablet; Refill: 1  12. GAD (generalized anxiety disorder) Continue bupropion  and escitalopram  as prescribed.  - escitalopram  (LEXAPRO ) 5 MG tablet; Take 1 tablet (5 mg total) by mouth daily.  Dispense: 90 tablet; Refill: 1 - buPROPion  (WELLBUTRIN  XL) 150 MG 24 hr tablet; Take 1 tablet (150 mg total) by mouth daily.  Dispense: 90 tablet; Refill: 1      General Counseling: Jennylee verbalizes understanding of the findings of todays visit and agrees with plan of treatment. I have discussed any further diagnostic evaluation that may be needed or ordered today. We also reviewed her medications today.  she has been encouraged to call the office with any questions or concerns that should arise related to todays visit.    Orders Placed This Encounter  Procedures   MM 3D SCREENING MAMMOGRAM BILATERAL BREAST   UA/M w/rflx Culture, Routine   Cologuard   Urine Microalbumin w/creat. ratio   Ambulatory referral to Ophthalmology    Meds ordered this encounter  Medications   Semaglutide , 1 MG/DOSE, (OZEMPIC , 1 MG/DOSE,) 4 MG/3ML SOPN    Sig: Inject 1 mg into the skin once a week.    Dispense:  9 mL    Refill:  1    Dx code E11.65   empagliflozin  (JARDIANCE ) 25 MG TABS tablet    Sig: Take 1 tablet (25 mg total)  by mouth daily.    Dispense:  90 tablet    Refill:  3   escitalopram  (LEXAPRO ) 5 MG tablet    Sig: Take 1 tablet (5 mg total) by mouth daily.    Dispense:  90 tablet    Refill:  1   cyclobenzaprine  (FLEXERIL ) 5 MG tablet    Sig: TAKE 1 TABLET BY MOUTH THREE TIMES A DAY AS NEEDED FOR MUSCLE SPASM    Dispense:  90 tablet    Refill:  1   clopidogrel  (PLAVIX ) 75 MG tablet    Sig: Take 1 tablet (75 mg total) by mouth daily with breakfast.    Dispense:  90 tablet    Refill:  1   buPROPion  (WELLBUTRIN  XL) 150 MG 24 hr tablet    Sig: Take 1 tablet (150 mg total) by mouth daily.    Dispense:  90 tablet    Refill:  1   albuterol  (VENTOLIN  HFA) 108 (90 Base) MCG/ACT inhaler    Sig: Inhale 1-2 puffs into the lungs daily.    Dispense:  18 g    Refill:  3    Please fill with generic alternative if it is more cost-effective for patient.    Return in about 4 months (around 07/09/2024) for F/U, Abbigal Radich PCP but also need nurse visit next week for a1c .   Total time spent:30 Minutes Time spent includes review of chart, medications, test results, and follow up plan with the patient.   [place] Controlled Substance Database was reviewed by me.  This patient was seen by Mardy Maxin, FNP-C in collaboration with Dr. Sigrid Bathe as a part of collaborative care agreement.  Burak Zerbe R. Maxin, MSN, FNP-C Internal medicine

## 2024-03-09 ENCOUNTER — Telehealth: Payer: Self-pay | Admitting: Nurse Practitioner

## 2024-03-09 LAB — MICROSCOPIC EXAMINATION

## 2024-03-09 NOTE — Telephone Encounter (Signed)
 Awaiting 03/08/24 office notes for Ophthalmology referral-Sherry Torres

## 2024-03-14 ENCOUNTER — Other Ambulatory Visit: Payer: Self-pay | Admitting: Nurse Practitioner

## 2024-03-14 LAB — MICROSCOPIC EXAMINATION: Casts: NONE SEEN /LPF

## 2024-03-14 LAB — UA/M W/RFLX CULTURE, ROUTINE
Bilirubin, UA: NEGATIVE
Ketones, UA: NEGATIVE
Nitrite, UA: POSITIVE — AB
Protein,UA: NEGATIVE
RBC, UA: NEGATIVE
Specific Gravity, UA: 1.021 (ref 1.005–1.030)
Urobilinogen, Ur: 1 mg/dL (ref 0.2–1.0)
pH, UA: 7 (ref 5.0–7.5)

## 2024-03-14 LAB — MICROALBUMIN / CREATININE URINE RATIO
Creatinine, Urine: 166.2 mg/dL
Microalb/Creat Ratio: 8 mg/g{creat} (ref 0–29)
Microalbumin, Urine: 14 ug/mL

## 2024-03-14 LAB — URINE CULTURE, REFLEX

## 2024-03-14 MED ORDER — SULFAMETHOXAZOLE-TRIMETHOPRIM 800-160 MG PO TABS: 1.0000 | ORAL_TABLET | Freq: Two times a day (BID) | ORAL | 0 refills | Status: AC

## 2024-03-14 NOTE — Progress Notes (Signed)
 Patient notified

## 2024-03-15 ENCOUNTER — Ambulatory Visit: Admitting: Nurse Practitioner

## 2024-03-15 DIAGNOSIS — E1159 Type 2 diabetes mellitus with other circulatory complications: Secondary | ICD-10-CM

## 2024-03-15 LAB — POCT GLYCOSYLATED HEMOGLOBIN (HGB A1C): Hemoglobin A1C: 6.4 % — AB (ref 4.0–5.6)

## 2024-03-15 NOTE — Progress Notes (Signed)
Pt here for A1c check

## 2024-03-22 ENCOUNTER — Encounter: Payer: Self-pay | Admitting: Nurse Practitioner

## 2024-03-28 ENCOUNTER — Telehealth: Payer: Self-pay | Admitting: Nurse Practitioner

## 2024-03-28 NOTE — Telephone Encounter (Signed)
 Ophthalmology referral sent via [facility]. Notified patient. Gave patient telephone # [phone]

## 2024-03-29 ENCOUNTER — Telehealth: Payer: Self-pay | Admitting: Nurse Practitioner

## 2024-03-29 NOTE — Telephone Encounter (Signed)
 [facility] declined referral. They do not accept patient's insurance. Notified patient. She will call her insurance for list of ophthalmologist who accept her insurance and call me back-Toni

## 2024-04-06 ENCOUNTER — Telehealth: Payer: Self-pay | Admitting: Nurse Practitioner

## 2024-04-06 NOTE — Telephone Encounter (Signed)
 Ophthalmology referral faxed to [facility]/ [place] due to insurance; [phone]. Notified patient. Gave pt telephone [phone]

## 2024-04-17 ENCOUNTER — Other Ambulatory Visit: Payer: Self-pay

## 2024-04-17 DIAGNOSIS — F3341 Major depressive disorder, recurrent, in partial remission: Secondary | ICD-10-CM

## 2024-04-17 DIAGNOSIS — F411 Generalized anxiety disorder: Secondary | ICD-10-CM

## 2024-04-17 MED ORDER — ESCITALOPRAM OXALATE 5 MG PO TABS: 5.0000 mg | ORAL_TABLET | Freq: Every day | ORAL | 1 refills | Status: AC

## 2024-04-19 ENCOUNTER — Encounter: Payer: Self-pay | Admitting: Nurse Practitioner

## 2024-04-19 ENCOUNTER — Telehealth: Payer: Self-pay

## 2024-04-19 NOTE — Telephone Encounter (Signed)
 Patient came to pick up 0.25/0.5mg  sample.

## 2024-04-20 ENCOUNTER — Other Ambulatory Visit: Payer: Self-pay

## 2024-04-20 MED ORDER — FLUCONAZOLE 150 MG PO TABS: ORAL_TABLET | ORAL | 0 refills | Status: AC

## 2024-04-20 NOTE — Telephone Encounter (Signed)
 Pt called that she is having yeast infection as per alyssa sent diflucan 

## 2024-06-10 ENCOUNTER — Other Ambulatory Visit: Payer: Self-pay | Admitting: Internal Medicine

## 2024-07-12 ENCOUNTER — Ambulatory Visit: Admitting: Nurse Practitioner

## 2025-03-09 ENCOUNTER — Encounter: Admitting: Nurse Practitioner
